# Patient Record
Sex: Female | Born: 1970 | Hispanic: No | Smoking: Never smoker
Health system: Southern US, Community
[De-identification: ages and names within clinical notes are randomized; demographics above are authoritative.]

## PROBLEM LIST (undated history)

## (undated) DIAGNOSIS — R519 Headache, unspecified: Secondary | ICD-10-CM

## (undated) DIAGNOSIS — K219 Gastro-esophageal reflux disease without esophagitis: Secondary | ICD-10-CM

## (undated) DIAGNOSIS — R51 Headache: Secondary | ICD-10-CM

## (undated) DIAGNOSIS — E079 Disorder of thyroid, unspecified: Secondary | ICD-10-CM

## (undated) HISTORY — PX: OTHER SURGICAL HISTORY: SHX169

## (undated) HISTORY — DX: Disorder of thyroid, unspecified: E07.9

## (undated) HISTORY — DX: Headache, unspecified: R51.9

## (undated) HISTORY — PX: BARIATRIC SURGERY: SHX1103

## (undated) HISTORY — PX: CHOLECYSTECTOMY: SHX55

## (undated) HISTORY — DX: Headache: R51

## (undated) HISTORY — PX: UMBILICAL HERNIA REPAIR: SHX196

---

## 1999-04-24 ENCOUNTER — Encounter: Admission: RE | Admit: 1999-04-24 | Discharge: 1999-04-24 | Payer: Self-pay | Admitting: Family Medicine

## 1999-04-24 ENCOUNTER — Encounter: Payer: Self-pay | Admitting: Family Medicine

## 1999-04-30 ENCOUNTER — Ambulatory Visit (HOSPITAL_COMMUNITY): Admission: RE | Admit: 1999-04-30 | Discharge: 1999-04-30 | Payer: Self-pay | Admitting: Gastroenterology

## 1999-04-30 ENCOUNTER — Encounter: Payer: Self-pay | Admitting: Gastroenterology

## 2001-12-20 ENCOUNTER — Other Ambulatory Visit: Admission: RE | Admit: 2001-12-20 | Discharge: 2001-12-20 | Payer: Self-pay | Admitting: Obstetrics & Gynecology

## 2003-05-15 ENCOUNTER — Other Ambulatory Visit: Admission: RE | Admit: 2003-05-15 | Discharge: 2003-05-15 | Payer: Self-pay | Admitting: Obstetrics & Gynecology

## 2003-10-29 ENCOUNTER — Encounter: Admission: RE | Admit: 2003-10-29 | Discharge: 2003-10-29 | Payer: Self-pay | Admitting: Family Medicine

## 2003-11-13 ENCOUNTER — Encounter (INDEPENDENT_AMBULATORY_CARE_PROVIDER_SITE_OTHER): Payer: Self-pay | Admitting: Specialist

## 2003-11-13 ENCOUNTER — Ambulatory Visit (HOSPITAL_COMMUNITY): Admission: RE | Admit: 2003-11-13 | Discharge: 2003-11-14 | Payer: Self-pay | Admitting: General Surgery

## 2005-08-29 ENCOUNTER — Emergency Department (HOSPITAL_COMMUNITY): Admission: EM | Admit: 2005-08-29 | Discharge: 2005-08-29 | Payer: Self-pay | Admitting: Family Medicine

## 2006-02-26 ENCOUNTER — Ambulatory Visit: Payer: Self-pay | Admitting: Hematology & Oncology

## 2006-04-05 LAB — CBC WITH DIFFERENTIAL/PLATELET
BASO%: 0.5 % (ref 0.0–2.0)
EOS%: 1.6 % (ref 0.0–7.0)
HCT: 36.1 % (ref 34.8–46.6)
LYMPH%: 26 % (ref 14.0–48.0)
MCH: 18.4 pg — ABNORMAL LOW (ref 26.0–34.0)
MCHC: 30.4 g/dL — ABNORMAL LOW (ref 32.0–36.0)
NEUT%: 68.9 % (ref 39.6–76.8)
Platelets: 315 10*3/uL (ref 145–400)

## 2006-04-06 LAB — IRON AND TIBC: UIBC: 278 ug/dL

## 2006-04-08 LAB — COMPREHENSIVE METABOLIC PANEL
ALT: 18 U/L (ref 0–35)
CO2: 29 mEq/L (ref 19–32)
Creatinine, Ser: 0.64 mg/dL (ref 0.40–1.20)
Total Bilirubin: 0.4 mg/dL (ref 0.3–1.2)

## 2006-04-08 LAB — IMMUNOFIXATION ELECTROPHORESIS
IgA: 225 mg/dL (ref 68–378)
IgG (Immunoglobin G), Serum: 2930 mg/dL — ABNORMAL HIGH (ref 694–1618)
IgM, Serum: 107 mg/dL (ref 60–263)

## 2006-04-08 LAB — LACTATE DEHYDROGENASE: LDH: 197 U/L (ref 94–250)

## 2006-04-23 ENCOUNTER — Ambulatory Visit: Payer: Self-pay | Admitting: Oncology

## 2006-04-27 LAB — COMPREHENSIVE METABOLIC PANEL
CO2: 28 mEq/L (ref 19–32)
Calcium: 9.1 mg/dL (ref 8.4–10.5)
Creatinine, Ser: 0.6 mg/dL (ref 0.40–1.20)
Sodium: 138 mEq/L (ref 135–145)
Total Bilirubin: 0.3 mg/dL (ref 0.3–1.2)
Total Protein: 7.8 g/dL (ref 6.0–8.3)

## 2006-04-27 LAB — CBC WITH DIFFERENTIAL/PLATELET
BASO%: 0.4 % (ref 0.0–2.0)
Basophils Absolute: 0.1 10*3/uL (ref 0.0–0.1)
Eosinophils Absolute: 0.3 10*3/uL (ref 0.0–0.5)
HCT: 38.2 % (ref 34.8–46.6)
HGB: 11.3 g/dL — ABNORMAL LOW (ref 11.6–15.9)
LYMPH%: 27.3 % (ref 14.0–48.0)
MCHC: 29.4 g/dL — ABNORMAL LOW (ref 32.0–36.0)
NEUT#: 9.9 10*3/uL — ABNORMAL HIGH (ref 1.5–6.5)
Platelets: 306 10*3/uL (ref 145–400)
RBC: 6.28 10*6/uL — ABNORMAL HIGH (ref 3.70–5.32)
WBC: 14.5 10*3/uL — ABNORMAL HIGH (ref 3.9–10.0)

## 2006-05-03 LAB — HEMOGLOBINOPATHY EVALUATION
Hgb A: 95.4 % — ABNORMAL LOW (ref 96.8–97.8)
Hgb S Quant: 0 % (ref 0.0–0.0)

## 2006-05-03 LAB — BCR/ABL QUALITATIVE: BCR/ABL t(9,22): NEGATIVE

## 2006-05-27 ENCOUNTER — Emergency Department (HOSPITAL_COMMUNITY): Admission: EM | Admit: 2006-05-27 | Discharge: 2006-05-27 | Payer: Self-pay | Admitting: Emergency Medicine

## 2006-05-30 ENCOUNTER — Emergency Department (HOSPITAL_COMMUNITY): Admission: EM | Admit: 2006-05-30 | Discharge: 2006-05-30 | Payer: Self-pay | Admitting: Emergency Medicine

## 2006-06-03 ENCOUNTER — Other Ambulatory Visit: Admission: RE | Admit: 2006-06-03 | Discharge: 2006-06-03 | Payer: Self-pay | Admitting: Family Medicine

## 2006-06-08 ENCOUNTER — Ambulatory Visit: Payer: Self-pay | Admitting: Oncology

## 2006-06-08 LAB — CBC WITH DIFFERENTIAL/PLATELET
BASO%: 0.6 % (ref 0.0–2.0)
LYMPH%: 26.6 % (ref 14.0–48.0)
MCHC: 30.1 g/dL — ABNORMAL LOW (ref 32.0–36.0)
MONO#: 0.5 10*3/uL (ref 0.1–0.9)
Platelets: 285 10*3/uL (ref 145–400)
RBC: 5.87 10*6/uL — ABNORMAL HIGH (ref 3.70–5.32)
RDW: 19.3 % — ABNORMAL HIGH (ref 11.3–14.5)
WBC: 9.3 10*3/uL (ref 3.9–10.0)

## 2006-06-09 LAB — ANA: Anti Nuclear Antibody(ANA): NEGATIVE

## 2006-06-09 LAB — HEPATITIS B CORE ANTIBODY, TOTAL: Hep B Core Total Ab: NEGATIVE

## 2006-06-17 ENCOUNTER — Ambulatory Visit (HOSPITAL_BASED_OUTPATIENT_CLINIC_OR_DEPARTMENT_OTHER): Admission: RE | Admit: 2006-06-17 | Discharge: 2006-06-17 | Payer: Self-pay | Admitting: Family Medicine

## 2006-06-20 ENCOUNTER — Ambulatory Visit: Payer: Self-pay | Admitting: Internal Medicine

## 2007-06-09 ENCOUNTER — Inpatient Hospital Stay (HOSPITAL_COMMUNITY): Admission: RE | Admit: 2007-06-09 | Discharge: 2007-06-15 | Payer: Self-pay | Admitting: General Surgery

## 2007-11-14 ENCOUNTER — Ambulatory Visit: Payer: Self-pay | Admitting: Pulmonary Disease

## 2007-11-14 DIAGNOSIS — G4733 Obstructive sleep apnea (adult) (pediatric): Secondary | ICD-10-CM | POA: Insufficient documentation

## 2007-11-14 DIAGNOSIS — J309 Allergic rhinitis, unspecified: Secondary | ICD-10-CM | POA: Insufficient documentation

## 2007-11-22 ENCOUNTER — Ambulatory Visit (HOSPITAL_BASED_OUTPATIENT_CLINIC_OR_DEPARTMENT_OTHER): Admission: RE | Admit: 2007-11-22 | Discharge: 2007-11-22 | Payer: Self-pay | Admitting: Pulmonary Disease

## 2007-11-22 ENCOUNTER — Ambulatory Visit: Payer: Self-pay | Admitting: Pulmonary Disease

## 2007-11-29 ENCOUNTER — Encounter: Payer: Self-pay | Admitting: Pulmonary Disease

## 2007-12-08 ENCOUNTER — Ambulatory Visit: Payer: Self-pay | Admitting: Pulmonary Disease

## 2007-12-09 ENCOUNTER — Ambulatory Visit: Payer: Self-pay | Admitting: Pulmonary Disease

## 2007-12-09 DIAGNOSIS — J45909 Unspecified asthma, uncomplicated: Secondary | ICD-10-CM | POA: Insufficient documentation

## 2007-12-28 ENCOUNTER — Telehealth (INDEPENDENT_AMBULATORY_CARE_PROVIDER_SITE_OTHER): Payer: Self-pay | Admitting: *Deleted

## 2008-02-08 ENCOUNTER — Ambulatory Visit: Payer: Self-pay | Admitting: Pulmonary Disease

## 2008-02-21 ENCOUNTER — Emergency Department (HOSPITAL_COMMUNITY): Admission: EM | Admit: 2008-02-21 | Discharge: 2008-02-21 | Payer: Self-pay | Admitting: Emergency Medicine

## 2008-02-28 ENCOUNTER — Encounter: Payer: Self-pay | Admitting: Pulmonary Disease

## 2008-02-28 ENCOUNTER — Encounter: Admission: RE | Admit: 2008-02-28 | Discharge: 2008-04-09 | Payer: Self-pay | Admitting: Pulmonary Disease

## 2008-04-10 ENCOUNTER — Ambulatory Visit: Payer: Self-pay | Admitting: Diagnostic Radiology

## 2008-04-10 ENCOUNTER — Emergency Department (HOSPITAL_BASED_OUTPATIENT_CLINIC_OR_DEPARTMENT_OTHER): Admission: EM | Admit: 2008-04-10 | Discharge: 2008-04-10 | Payer: Self-pay | Admitting: Emergency Medicine

## 2008-05-08 ENCOUNTER — Ambulatory Visit: Payer: Self-pay | Admitting: Pulmonary Disease

## 2008-09-05 ENCOUNTER — Emergency Department (HOSPITAL_COMMUNITY): Admission: EM | Admit: 2008-09-05 | Discharge: 2008-09-05 | Payer: Self-pay | Admitting: Family Medicine

## 2008-10-18 ENCOUNTER — Encounter: Payer: Self-pay | Admitting: Pulmonary Disease

## 2009-07-19 ENCOUNTER — Telehealth: Payer: Self-pay | Admitting: Pulmonary Disease

## 2009-07-19 ENCOUNTER — Telehealth (INDEPENDENT_AMBULATORY_CARE_PROVIDER_SITE_OTHER): Payer: Self-pay | Admitting: *Deleted

## 2009-07-29 ENCOUNTER — Ambulatory Visit: Payer: Self-pay | Admitting: Pulmonary Disease

## 2009-07-29 DIAGNOSIS — R0902 Hypoxemia: Secondary | ICD-10-CM | POA: Insufficient documentation

## 2009-07-30 ENCOUNTER — Telehealth: Payer: Self-pay | Admitting: Pulmonary Disease

## 2009-08-02 ENCOUNTER — Encounter: Payer: Self-pay | Admitting: Pulmonary Disease

## 2009-08-16 ENCOUNTER — Encounter: Payer: Self-pay | Admitting: Pulmonary Disease

## 2009-08-19 ENCOUNTER — Ambulatory Visit: Payer: Self-pay | Admitting: Pulmonary Disease

## 2009-08-22 ENCOUNTER — Encounter: Payer: Self-pay | Admitting: Pulmonary Disease

## 2009-08-27 ENCOUNTER — Encounter: Payer: Self-pay | Admitting: Pulmonary Disease

## 2009-11-18 ENCOUNTER — Ambulatory Visit (HOSPITAL_COMMUNITY): Admission: RE | Admit: 2009-11-18 | Discharge: 2009-11-18 | Payer: Self-pay | Admitting: Surgery

## 2009-11-24 ENCOUNTER — Encounter: Payer: Self-pay | Admitting: Pulmonary Disease

## 2009-11-24 DIAGNOSIS — E662 Morbid (severe) obesity with alveolar hypoventilation: Secondary | ICD-10-CM | POA: Insufficient documentation

## 2009-11-25 ENCOUNTER — Ambulatory Visit: Payer: Self-pay | Admitting: Pulmonary Disease

## 2009-12-01 ENCOUNTER — Emergency Department (HOSPITAL_COMMUNITY): Admission: EM | Admit: 2009-12-01 | Discharge: 2009-12-02 | Payer: Self-pay | Admitting: Emergency Medicine

## 2009-12-02 ENCOUNTER — Ambulatory Visit (HOSPITAL_COMMUNITY): Admission: RE | Admit: 2009-12-02 | Discharge: 2009-12-02 | Payer: Self-pay | Admitting: Surgery

## 2009-12-03 ENCOUNTER — Encounter: Payer: Self-pay | Admitting: Pulmonary Disease

## 2009-12-09 ENCOUNTER — Encounter: Admission: RE | Admit: 2009-12-09 | Discharge: 2010-02-06 | Payer: Self-pay | Admitting: Surgery

## 2009-12-16 ENCOUNTER — Encounter: Payer: Self-pay | Admitting: Pulmonary Disease

## 2010-01-30 ENCOUNTER — Ambulatory Visit: Payer: Self-pay | Admitting: Pulmonary Disease

## 2010-02-04 ENCOUNTER — Telehealth (INDEPENDENT_AMBULATORY_CARE_PROVIDER_SITE_OTHER): Payer: Self-pay | Admitting: *Deleted

## 2010-03-28 ENCOUNTER — Ambulatory Visit: Payer: Self-pay | Admitting: Pulmonary Disease

## 2010-05-19 ENCOUNTER — Ambulatory Visit
Admission: RE | Admit: 2010-05-19 | Discharge: 2010-05-19 | Payer: Self-pay | Source: Home / Self Care | Attending: Pulmonary Disease | Admitting: Pulmonary Disease

## 2010-05-23 ENCOUNTER — Encounter: Payer: Self-pay | Admitting: Pulmonary Disease

## 2010-05-24 ENCOUNTER — Encounter: Payer: Self-pay | Admitting: Pulmonary Disease

## 2010-06-12 NOTE — Progress Notes (Signed)
Summary: surgery clearance-need letter asap  Phone Note Call from Patient Call back at cell: [phone]   Caller: Patient Call For: [facility] Summary of Call: would like surgery clearance for gastric bypass surgery d/t sleep apnea.  scheduled for 10.25.11, letter needs to be given to nurse by 10.3.11 - Sherri.  patient will come to pick this up.   Initial call taken by: Boone Master CNA/MA,  February 04, 2010 3:30 PM  Follow-up for Phone Call        [facility]. Does pt need letter or does OV note need to be sent to nurse? Carron Curie CMA  February 04, 2010 4:16 PM  sorry, pt is requesting a letter.  also, pt is here in the office with her mother for a . Boone Master CNA/MA  February 04, 2010 4:19 PM    Additional Follow-up for Phone Call Additional follow up Details #1::        My last OV note should be adequate. Additional Follow-up by: Coralyn Helling MD,  February 06, 2010 1:35 PM    Additional Follow-up for Phone Call Additional follow up Details #2::    printed last OV note and left at front desk for pt to pick up.  pt aware.  Aundra Millet Reynolds LPN  February 06, 2010 2:39 PM

## 2010-06-12 NOTE — Procedures (Signed)
Summary: Oxygen Sat/[facility]  Oxygen Sat/[facility]   Imported By: Lester [place] 12/19/2009 08:05:14  _____________________________________________________________________  External Attachment:    Type:   Image     Comment:   External Document

## 2010-06-12 NOTE — Assessment & Plan Note (Signed)
Summary: [name] ///kp   Copy to:  Sabino Gasser, Dr. Daphine Deutscher with Bariatric surgery program [facility] Primary Provider/Referring Provider:  Shaune Pollack  CC:  [name], SOB with exertion and at rest, pt states breathing is okay, bipap uses everynight, it is currently messing up, x6 hours a  night, pt states she is getting her bipap fixed soon, pt states at night occasionally she has to turn her oxygen up to 4liters bc she states she quits breathing, and pt states she has a dry cough at night and it causes her to wake up at times.  History of Present Illness: 40 yo female with severe OSA/OHS using BPAP and 3 liters oxygen and asthma.  She has not been able to lose weight.  She was told that she would not be a candidate for bariatric surgery unless she can get her BMI to 50.    She has more trouble with her breathing with the heat.  She was doing better when on inhalers, but ran out of these.  She has some cough and occasional wheeze.  She does not have much sputum, and denies chest pain.  Her sinuses have been okay.  She tries using her BPAP.  She wakes up during the night feeling sweaty and with a cough.  She then takes her mask off.  She thinks the machine is broken.  She continues to use supplemental oxygen at 3 liters continuously.  Preventive Screening-Counseling & Management  Alcohol-Tobacco     Smoking Status: never     Passive Smoke Exposure: yes  Current Medications (verified): 1)  Advair Diskus 250-50 Mcg/dose  Misc (Fluticasone-Salmeterol) .... One Inhalation Two Times A Day 2)  Proventil Hfa 108 (90 Base) Mcg/act  Aers (Albuterol Sulfate) .... Two Puffs Four Times Daily. 3)  Levothyroxine Sodium 150 Mcg Tabs (Levothyroxine Sodium) .Marland Kitchen.. 1 By Mouth Daily 4)  Glucophage 500 Mg  Tabs (Metformin Hcl) .... Take One Tablet By Mouth Two Times A Day 5)  Nasonex 50 Mcg/act  Susp (Mometasone Furoate) .... 2 Sprays Each Nostril Once Daily 6)  Bpap .... At Bedtime  Allergies  (verified): No Known Drug Allergies  Past History:  Past Medical History: Severe OSA      - PSG 06/17/06 AHI 154, O2 nadir 54%      - autoBPAP Hypoxemia      - SpO2 81% on room air with exertion 07/29/09      - 3 liters oxygen 24/7 Morbid obesity Diabetes mellitus Hypothyroidism Asthma      - Spirometry 07/29/09 FEV1 1.29(47%), FEV1% 87 Rhinitis  Past Surgical History: Reviewed history from 11/14/2007 and no changes required. Laprascopic Cholecystectomy 11/13/03 Ventral hernia repair 06/09/07  Vital Signs:  Patient profile:   40 year old female Height:      62 inches Weight:      347.6 pounds O2 Sat:      94 % on 3 L/min Temp:     98.5 degrees F oral Pulse rate:   87 / minute BP sitting:   124 / 78  (left arm) Cuff size:   large  Vitals Entered By: Carver Fila (November 25, 2009 11:18 AM)  O2 Flow:  3 L/min CC: [name], SOB with exertion and at rest, pt states breathing is okay, bipap uses everynight, it is currently messing up, x6 hours a  night, pt states she is getting her bipap fixed soon, pt states at night occasionally she has to turn her oxygen up to 4liters bc she  states she quits breathing, pt states she has a dry cough at night and it causes her to wake up at times Comments meds and allergies updated Phone number updated Carver Fila  November 25, 2009 11:18 AM    Physical Exam  General:  on supplemental oxygen and obese.   Nose:  clear nasal discharge. Mouth:  MP IV, no erythema Neck:  no JVD.   Lungs:  Decreased breath sounds, no wheezing or rales Heart:  regular rhythm, normal rate, and no murmurs.   Extremities:  2+ nonpitting edema Cervical Nodes:  no significant adenopathy   Impression & Recommendations:  Problem # 1:  SLEEP APNEA, OBSTRUCTIVE (ICD-327.23) She thinks her machine is not working right.  I will have her DME check her set up and also get a copy of her current download.  I again emphasized the importance of maintaining her compliance with  BPAP.  Problem # 2:  HYPOXEMIA (ICD-799.02) She is to continue on 3 liters oxygen.  I explained that she will likely need to use oxygen until she can lose weight.  Problem # 3:  ASTHMA (ICD-493.90) She has difficulty affording her inhalers.  I have given her samples of advair and proair.  Her breathing is better with inhaler therapy.  Problem # 4:  ALLERGIC RHINITIS (ICD-477.9) She is to continue with nasonex as needed.  I have given her a sample of this.  Problem # 5:  MORBID OBESITY (ICD-278.01) She is followed at the bariatric surgery clinic.  She has been educated about diet modification.  I explained to her the importance of exercise.  I explained to her that there are no pulmonary contra-indications to her exercising.  I advised her to start a gradual exercise program with walking.  Medications Added to Medication List This Visit: 1)  Proair Hfa 108 (90 Base) Mcg/act Aers (Albuterol sulfate) .... Two puffs up to four times per day as needed  Complete Medication List: 1)  Advair Diskus 250-50 Mcg/dose Misc (Fluticasone-salmeterol) .... One inhalation two times a day 2)  Proair Hfa 108 (90 Base) Mcg/act Aers (Albuterol sulfate) .... Two puffs up to four times per day as needed 3)  Nasonex 50 Mcg/act Susp (Mometasone furoate) .... 2 sprays each nostril once daily 4)  Levothyroxine Sodium 150 Mcg Tabs (Levothyroxine sodium) .Marland Kitchen.. 1 by mouth daily 5)  Glucophage 500 Mg Tabs (Metformin hcl) .... Take one tablet by mouth two times a day  Other Orders: Est. Patient Level III (04540) DME Referral (DME)  Patient Instructions: 1)  Will check BPAP machine pressure 2)  Follow up in 4 to 6 months Prescriptions: NASONEX 50 MCG/ACT  SUSP (MOMETASONE FUROATE) 2 sprays each nostril once daily  #1 x 6   Entered and Authorized by:   Coralyn Helling MD   Signed by:   Coralyn Helling MD on 11/25/2009   Method used:   Electronically to        [facility]  [phone]* (retail)       [address]       [place], [place]  [address]       Ph: [phone] or [phone]       Fax: [phone]   RxID:   [id number] ADVAIR DISKUS 250-50 MCG/DOSE  MISC (FLUTICASONE-SALMETEROL) One inhalation two times a day  #1 x 6   Entered and Authorized by:   Coralyn Helling MD   Signed by:   Coralyn Helling MD  on 11/25/2009   Method used:   Electronically to        [facility]  [phone]* (retail)       [address]       [place], [place]  [address]       Ph: [phone] or [phone]       Fax: [phone]   RxID:   [id number] PROAIR HFA 108 (90 BASE) MCG/ACT AERS (ALBUTEROL SULFATE) two puffs up to four times per day as needed  #1 x 6   Entered and Authorized by:   Coralyn Helling MD   Signed by:   Coralyn Helling MD on 11/25/2009   Method used:   Electronically to        [facility]  [phone]* (retail)       [address]       [place], [place]  [address]       Ph: [phone] or [phone]       Fax: [phone]   RxID:   [id number]    Immunization History:  Influenza Immunization History:    Influenza:  historical (01/17/2009)  Pneumovax Immunization History:    Pneumovax:  historical (10/10/2006)

## 2010-06-12 NOTE — Assessment & Plan Note (Signed)
Summary: rov ///kp   Copy to:  Sabino Gasser, [facility] in [facility] Primary Provider/Referring Provider:  Shaune Pollack  CC:  Patient here for follow-up...gastric bypass surgery done 03/04/2010...she says her breathing has improved...not wearing bipap on a regular basis...no oxygen use.  History of Present Illness: 40 yo female with severe OSA/OHS using BPAP and 3 liters oxygen and asthma.  She has lost an additional 40lbs since her last visit.  She is not using her oxygen.  She is intermittently using BPAP, and is not sure if this is helping anymore.  Her breathing has improved with weight loss.  She is using advair once daily, and not using proair.  She continues to use nasonex, and this helps.    Current Medications (verified): 1)  Advair Diskus 250-50 Mcg/dose  Misc (Fluticasone-Salmeterol) .... One Inhalation Two Times A Day 2)  Proair Hfa 108 (90 Base) Mcg/act Aers (Albuterol Sulfate) .... Two Puffs Up To Four Times Per Day As Needed 3)  Nasonex 50 Mcg/act  Susp (Mometasone Furoate) .... 2 Sprays Each Nostril Once Daily 4)  Levothyroxine Sodium 150 Mcg Tabs (Levothyroxine Sodium) .Marland Kitchen.. 1 By Mouth Daily 5)  Spacer Device .... Use As Needed With Proair  Allergies (verified): No Known Drug Allergies  Past History:  Past Medical History: Severe OSA      - PSG 06/17/06 AHI 154, O2 nadir 54%      - BPAP 16/12 Hypoxemia      - SpO2 81% on room air with exertion 07/29/09      - Improved with weight loss Morbid obesity      - s/p bariatric surgery Oct 2011 Diabetes mellitus Hypothyroidism Asthma      - Spirometry 07/29/09 FEV1 1.29(47%), FEV1% 87 Rhinitis  Vital Signs:  Patient profile:   40 year old female Height:      62 inches (157.48 cm) Weight:      264.13 pounds (120.06 kg) BMI:     48.48 O2 Sat:      97 % on Room air Temp:     98.5 degrees F (36.94 degrees C) oral Pulse rate:   64 / minute BP sitting:   102 / 70  (left arm) Cuff size:    large  Vitals Entered By: Michel Bickers CMA (May 19, 2010 10:36 AM)  O2 Sat at Rest %:  97 O2 Flow:  Room air CC: Patient here for follow-up...gastric bypass surgery done 03/04/2010...she says her breathing has improved...not wearing bipap on a regular basis...no oxygen use Comments Medications reviewed with patient Michel Bickers CMA  May 19, 2010 10:42 AM   Physical Exam  General:  normal appearance and healthy appearing.   Nose:  no deformity, discharge, inflammation, or lesions Mouth:  MP 3, no erythema Neck:  no JVD.   Lungs:  clear bilaterally to auscultation and percussion Heart:  regular rhythm, normal rate, and no murmurs.   Extremities:  no clubbing, cyanosis, edema, or deformity noted Neurologic:  normal CN II-XII and strength normal.   Cervical Nodes:  no significant adenopathy Psych:  alert and cooperative; normal mood and affect; normal attention span and concentration   Impression & Recommendations:  Problem # 1:  ASTHMA (ICD-493.90) Improved.  Will change to advair 100/50.  If she is stable at next visit, will continue to step down her inhaler regimen.  Problem # 2:  ALLERGIC RHINITIS (ICD-477.9) She is to continue nasonex.  Problem # 3:  SLEEP APNEA, OBSTRUCTIVE (  ICD-327.23) Depending on results of ONO will decide if she needs to have repeat sleep test to determine if her sleep apnea has resolved with weight loss.  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  Problem # 5:  HYPOXEMIA (ICD-799.02) Will arrange for ONO on room air to see if she can have home oxygen removed.  Problem # 6:  MORBID OBESITY (ICD-278.01) She has lost almost 70 lbs since her gastric bypass surgery!  Medications Added to Medication List This Visit: 1)  Advair Diskus 100-50 Mcg/dose Aepb (Fluticasone-salmeterol) .... One puff two times a day  Complete Medication List: 1)  Advair Diskus 100-50 Mcg/dose Aepb (Fluticasone-salmeterol)  .... One puff two times a day 2)  Proair Hfa 108 (90 Base) Mcg/act Aers (Albuterol sulfate) .... Two puffs up to four times per day as needed 3)  Nasonex 50 Mcg/act Susp (Mometasone furoate) .... 2 sprays each nostril once daily 4)  Levothyroxine Sodium 150 Mcg Tabs (Levothyroxine sodium) .Marland Kitchen.. 1 by mouth daily 5)  Spacer Device  .... Use as needed with proair  Other Orders: Est. Patient Level III (91478) DME Referral (DME)  Patient Instructions: 1)  Change advair to 100/50 one puff two times a day 2)  Proair two puffs as needed 3)  Nasonex two sprays once daily 4)  Will schedule oxygen test at home 5)  Follow up in 4 months Prescriptions: NASONEX 50 MCG/ACT  SUSP (MOMETASONE FUROATE) 2 sprays each nostril once daily  #1 x 6   Entered and Authorized by:   Coralyn Helling MD   Signed by:   Coralyn Helling MD on 05/19/2010   Method used:   Electronically to        [facility]  [phone]* (retail)       [address]       [place], [place]  [address]       Ph: [phone] or [phone]       Fax: [phone]   RxID:   [id number] PROAIR HFA 108 (90 BASE) MCG/ACT AERS (ALBUTEROL SULFATE) two puffs up to four times per day as needed  #1 x 6   Entered and Authorized by:   Coralyn Helling MD   Signed by:   Coralyn Helling MD on 05/19/2010   Method used:   Electronically to        [facility]  [phone]* (retail)       [address]       [place], [place]  [address]       Ph: [phone] or [phone]       Fax: [phone]   RxID:   [id number] ADVAIR DISKUS 100-50 MCG/DOSE AEPB (FLUTICASONE-SALMETEROL) one puff two times a day  #1 x 6   Entered and Authorized by:   Coralyn Helling MD   Signed by:   Coralyn Helling MD on 05/19/2010   Method used:   Electronically to        [facility]  [phone]* (retail)       [address]       [place], [place]  [address]       Ph: [phone] or  [phone]       Fax: [phone]   RxID:   [id number]    Immunization History:  Influenza Immunization History:    Influenza:  historical (01/09/2010)   Appended Document: rov ///kp Ambulatory Pulse Oximetry  Resting; HR__62___    02 Sat__99%RA___  Lap1 (185 feet)   HR__91___   02 Sat_94%RA____ Lap2 (185 feet)   HR__92___   02 Sat_94%RA____    Lap3 (185 feet)   HR__89___   02 Sat_94%RA____  _X__Test Completed without Difficulty ___Test Stopped due to:

## 2010-06-12 NOTE — Progress Notes (Signed)
Summary: Records request from patient  Request for records received from patient. Copied 27 pages for patient pickup on Tuesday July 23, 2009. [name]  July 19, 2009 12:35 PM

## 2010-06-12 NOTE — Miscellaneous (Signed)
Summary: ONO on room air  Clinical Lists Changes Test time 6hrs 9 min.  Mean SpO2 89%, low 82%.  Spent 52 min (14%) with SpO2 < 88%.  Attempted to call pt to discuss results.   Will have my nurse call to inform patient that her oxygen is still low while she is asleep.  She does not need to use BPAP, but should continue to use oxygen at 2 liters while asleep.  Will re-assess as she continues to lose weight after bariatric surgery. Orders: Added new Referral order of DME Referral (DME) - Signed  Appended Document: ONO on room air atc pt but no answer and was unable to leave VM. Will try back later  Appended Document: ONO on room air lm on cell phone to call back  Appended Document: ONO on room air called pt home phone number and was told pt was admitted to [facility] for vomiting, sob, cough. Pt family member was not able to tell me when she would be d/c'd.

## 2010-06-12 NOTE — Miscellaneous (Signed)
Summary: BPAP download  Clinical Lists Changes Used on 12 of 15 nights with average 4hrs 25 min.  Average AHI 7 with optimal pressure of BPAP 16/12 cm H2O.  Minimal airleak.  Will have my nurse call to inform pt that BPAP download looked good, but she should try to use BPAP more consistently.  Appended Document: BPAP download Called and informed pt of the results and pt stated she verbally understood and will try to use bpap more consistently

## 2010-06-12 NOTE — Miscellaneous (Signed)
Summary: Overnight oximetry on 3L oxygen  Clinical Lists Changes Study was requested to be done with BPAP and 3 liters O2.  Study was done without BPAP.  Sample time 8hrs .  Mean SpO2 88%, low 44%.    Will discuss further with patient at [facility] on August 19, 2009.

## 2010-06-12 NOTE — Assessment & Plan Note (Signed)
Summary: 3-4 week f/u/LC   Copy to:  Sabino Gasser, Bariatric surgery program [facility] Primary Provider/Referring Provider:  Shaune Pollack  CC:  OV 3/4 week F/U, Pt states she feels better on 4LPM, and but is still on 3LPM.  History of Present Illness: I saw [name] in follow up for her severe sleep apnea, and asthma.  Her breathing and sinuses have been doing better since she was restarted on nasonex and adviar.  She had her recent overnight oximetry.  Apparently there was some confusion with the DME company about the order.  The test was done while using oxygen, but no BPAP.  This was done inspite of my orders, and inspite of the patient informing the DME company that the test was to be done with BPAP and oxygen.    Allergies (verified): No Known Drug Allergies  Past History:  Past Medical History: Reviewed history from 07/29/2009 and no changes required. Severe OSA      - PSG 06/17/06 AHI 154, O2 nadir 54% Hypoxemia      - SpO2 81% on room air with exertion 07/29/09      - 3 liters oxygen with exertion and sleep Morbid obesity Diabetes mellitus Hypothyroidism Asthma Rhinitis  Past Surgical History: Reviewed history from 11/14/2007 and no changes required. Laprascopic Cholecystectomy 11/13/03 Ventral hernia repair 06/09/07  Vital Signs:  Patient profile:   40 year old female Height:      62 inches Weight:      343.8 pounds O2 Sat:      97 % on 3 L/min Temp:     98.1 degrees F oral Pulse rate:   85 / minute BP sitting:   136 / 88  (right arm) Cuff size:   large  Vitals Entered By: Denna Haggard, CMA (August 19, 2009 3:18 PM)  O2 Sat at Rest %:  97% 3LPM O2 Flow:  3 L/min CC: OV 3/4 week F/U, Pt states she feels better on 4LPM, but is still on 3LPM   Physical Exam  General:  obese.   Nose:  clear nasal discharge. Mouth:  MP IV, no erythema Neck:  no JVD.   Lungs:  Decreased breath sounds, no wheezing or rales Heart:  regular rhythm,  normal rate, and no murmurs.   Extremities:  2+ nonpitting edema Cervical Nodes:  no significant adenopathy   Impression & Recommendations:  Problem # 1:  SLEEP APNEA, OBSTRUCTIVE (ICD-327.23) Will have her DME company send a current download from her BPAP.  Last download sent from DME company only had information through 2010.  Problem # 2:  HYPOXEMIA (ICD-799.02) She is to continue on supplemental oxygen.  Will have the DME company do a repeat ONO, but this time ensure that the procedure is done with BPAP and oxygen.  Problem # 3:  ASTHMA (ICD-493.90) She has improved with advair.  I have given her additional samples.  Problem # 4:  ALLERGIC RHINITIS (ICD-477.9) Continue nasal irrigation and nasonex as needed.  Problem # 5:  MORBID OBESITY (ICD-278.01) She is to follow up with the Bariatic Surgery program.  Complete Medication List: 1)  Advair Diskus 250-50 Mcg/dose Misc (Fluticasone-salmeterol) .... One inhalation two times a day 2)  Proventil Hfa 108 (90 Base) Mcg/act Aers (Albuterol sulfate) .... Two puffs four times daily. 3)  Levothyroxine Sodium 150 Mcg Tabs (Levothyroxine sodium) .Marland Kitchen.. 1 by mouth daily 4)  Glucophage 500 Mg Tabs (Metformin hcl) .... Take one tablet by mouth two times a day  5)  Nasonex 50 Mcg/act Susp (Mometasone furoate) .... 2 sprays each nostril once daily 6)  Bpap  .... At bedtime  Other Orders: Est. Patient Level I (16109) DME Referral (DME) DME Referral (DME)  Patient Instructions: 1)  Follow up in 4 months

## 2010-06-12 NOTE — Miscellaneous (Signed)
Summary: ONO with BPAP and 3 liters oxygen from 08/21/09  Clinical Lists Changes Test time 7hrs 30 min.  Mean SpO2 91%, low 81%.  Spent 24 sec (2.1% of test) with SpO2 < 88%.  Will have my nurse call to inform pt that oxygen report looked good, and to continue with current set up for BPAP and 3 liters oxygen.  Appended Document: ONO with BPAP and 3 liters oxygen from 08/21/09 The patient is aware of ONO results and to stay on current set up with BIPAP and 3 liters oxygen.

## 2010-06-12 NOTE — Miscellaneous (Signed)
Summary: auto CPAP report  Clinical Lists Changes Used on 10 of 116 days.  Average use 3hrs 45 min per night.  Optimal pressure 15 cm H2O with average AHI 2.9.  Patient has been set up with CPAP even though my order was for BPAP.  She reported having more difficulty with her current machine (which would be CPAP) compared to previous machines used (BPAP).  Will have [facility] check to see why pt has CPAP and not BPAP as ordered. 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

## 2010-06-12 NOTE — Assessment & Plan Note (Signed)
Summary: rov/per pt request/apc   Visit Type:  Follow-up Copy to:  Bindu Balan, [facility] for Bariatric Surgery in [facility]  CC:  OSA.Marland KitchenMarland KitchenAsthma.Marland KitchenMarland KitchenThe pt says she is doing well on bpap...she c/o some increased sob with exertion...she is requesting a spacer to use with her Proair.  History of Present Illness: 40 yo female with severe OSA/OHS using BPAP and 3 liters oxygen and asthma.  She had BPAP download from December 16, 2009:  Used on 12 of 15 nights with average 4hrs 25 min.  Average AHI 7 with optimal pressure of BPAP 16/12 cm H2O.  Minimal airleak.  She has been doing better with BPAP.  She is being evaluated for bariatric surgery at the [facility] for Bariatric Surgery.  Her breathing otherwise has been stable.  She does not have much cough.  She has not been getting wheeze, or sputum.  She denies chest pain or palpitations.        Current Medications (verified): 1)  Advair Diskus 250-50 Mcg/dose  Misc (Fluticasone-Salmeterol) .... One Inhalation Two Times A Day 2)  Proair Hfa 108 (90 Base) Mcg/act Aers (Albuterol Sulfate) .... Two Puffs Up To Four Times Per Day As Needed 3)  Nasonex 50 Mcg/act  Susp (Mometasone Furoate) .... 2 Sprays Each Nostril Once Daily 4)  Levothyroxine Sodium 150 Mcg Tabs (Levothyroxine Sodium) .Marland Kitchen.. 1 By Mouth Daily 5)  Glucophage 500 Mg  Tabs (Metformin Hcl) .... Take One Tablet By Mouth Two Times A Day 6)  Ferrous Sulfate 325 (65 Fe) Mg Tabs (Ferrous Sulfate) .Marland Kitchen.. 1 By Mouth Daily  Allergies (verified): No Known Drug Allergies  Past History:  Past Medical History: Severe OSA      - PSG 06/17/06 AHI 154, O2 nadir 54%      - BPAP 16/12 Hypoxemia      - SpO2 81% on room air with exertion 07/29/09      - 3 liters oxygen 24/7 Morbid obesity Diabetes mellitus Hypothyroidism Asthma      - Spirometry 07/29/09 FEV1 1.29(47%), FEV1% 87 Rhinitis  Past Surgical History: Reviewed history from 11/14/2007 and no changes required. Laprascopic  Cholecystectomy 11/13/03 Ventral hernia repair 06/09/07  Family History: Reviewed history from 11/14/2007 and no changes required. Heart Disease - Father and mother  Social History: Reviewed history from 11/14/2007 and no changes required. Married.  Disabled.  No history of tobacco or alcohol abuse.  Vital Signs:  Patient profile:   40 year old female Height:      62 inches (157.48 cm) Weight:      346 pounds (157.27 kg) BMI:     63.51 O2 Sat:      98 % on 3 L/min Temp:     98.4 degrees F (36.89 degrees C) oral Pulse rate:   83 / minute BP sitting:   118 / 90  (right arm) Cuff size:   large  Vitals Entered By: Michel Bickers CMA (January 30, 2010 3:34 PM)  O2 Sat at Rest %:  98 O2 Flow:  3 L/min  Physical Exam  General:  on supplemental oxygen and obese.   Eyes:  PERRLA and EOMI.   Nose:  clear nasal discharge. Mouth:  MP IV, no erythema Neck:  no JVD.   Lungs:  Decreased breath sounds, no wheezing or rales Heart:  regular rhythm, normal rate, and no murmurs.   Abdomen:  obese, soft, nontender Extremities:  2+ nonpitting edema Neurologic:  normal CN II-XII and strength normal.   Cervical Nodes:  no significant adenopathy Psych:  alert and cooperative; normal mood and affect; normal attention span and concentration   Impression & Recommendations:  Problem # 1:  ASTHMA (ICD-493.90) Stable.  She is to continue advair and as needed proair.  I have given her a spacer device.  Problem # 2:  ALLERGIC RHINITIS (ICD-477.9)  Stable.  She is to continue using nasonex and nasal irrigation.  Problem # 3:  SLEEP APNEA, OBSTRUCTIVE (ICD-327.23)  Her recent BPAP download showed good control of her apnea.  Problem # 4:  OBESITY HYPOVENTILATION SYNDROME (ICD-278.03)  She is to continue on supplemental oxygen 24/7.    Problem # 5:  PRE-OPERATIVE RESPIRATORY EXAMINATION (ICD-V72.82)  She is being evaluated at the [facility] for Bariatric Surgery in [facility].  Clearly  the majority of her current health problems are related to her weight, and proceeding with surgical intervention is likely her only option at this point.  She is at increased peri-operative risk for pulmonary complications, but these risks are not prohibitive.  I believe that the benefits of surgery would outway the risk.  She has mild, persistent asthma that is well controlled on her current inhaler regimen of advair and as needed proair.  She should continue on her inhaler regimen during the peri-operative period.  She also has severe obstructive sleep apnea with obesity hypoventilation syndrome.  She is controlled on her regimen of BPAP 16/12 cm H2O with 3 liters oxygen.  She should have close monitoring of her oxygenation during the peri-operative period and continue with BPAP therapy during the peri-operative period.  Medications Added to Medication List This Visit: 1)  Ferrous Sulfate 325 (65 Fe) Mg Tabs (Ferrous sulfate) .Marland Kitchen.. 1 by mouth daily 2)  Spacer Device  .... Use as needed with proair  Complete Medication List: 1)  Advair Diskus 250-50 Mcg/dose Misc (Fluticasone-salmeterol) .... One inhalation two times a day 2)  Proair Hfa 108 (90 Base) Mcg/act Aers (Albuterol sulfate) .... Two puffs up to four times per day as needed 3)  Nasonex 50 Mcg/act Susp (Mometasone furoate) .... 2 sprays each nostril once daily 4)  Levothyroxine Sodium 150 Mcg Tabs (Levothyroxine sodium) .Marland Kitchen.. 1 by mouth daily 5)  Glucophage 500 Mg Tabs (Metformin hcl) .... Take one tablet by mouth two times a day 6)  Ferrous Sulfate 325 (65 Fe) Mg Tabs (Ferrous sulfate) .Marland Kitchen.. 1 by mouth daily 7)  Spacer Device  .... Use as needed with proair  Other Orders: Est. Patient Level IV (47829)  Patient Instructions: 1)  Will get spacer device 2)  Follow up in 4 months Prescriptions: SPACER DEVICE use as needed with proair  #1 x 0   Entered by:   Michel Bickers CMA   Authorized by:   Coralyn Helling MD   Signed by:   Michel Bickers  CMA on 01/30/2010   Method used:   Print then Give to Patient   RxID:   [id number]   Appended Document: rov/per pt request/apc Office note faxed to the [facility] for Bariatric Surgery per Dr. Evlyn Courier request. Fax # is [phone]. Phone # is [phone].

## 2010-06-12 NOTE — Assessment & Plan Note (Signed)
Summary: sleep follow-up//last seen 2009//jrc   Copy to:  [name], Bariatric surgery program [facility] Primary Provider/Referring Provider:  Shaune [name]  CC:  OSA follow-up. Last seen by [name] on 05/08/2008. The patient says she is doing well on CPAP.Marland Kitchen  History of Present Illness: I saw [name] in follow up for her severe sleep apnea, and asthma.  I had not seen [name] since 2009.  She was unable to follow up do to lack of insurance.  She is being evaluated for gastric bypass surgery due to her morbid obesity, and further pulmonary evaluation was requested.  She still has her BPAP machine.  She has not had contact with her [name] for quite some time.  She has a full face mask, and reports using her BPAP on a regular basis.  She reports being able to sleep okay with her BPAP.  She continues to have problems with dyspnea with minimal exertion.  She has a dry cough, and gets occasional wheeze.  She is not having fever, and her sinuses are okay.  She has been using her proventil about once per day.  She feels that advair does help.  She has difficulty affording her medications.  She is not currently using home oxygen.  She denies chest pain, but continues to have leg swelling.  Current Medications (verified): 1)  Advair Diskus 250-50 Mcg/dose  Misc (Fluticasone-Salmeterol) .... One Inhalation Two Times A Day 2)  Proventil Hfa 108 (90 Base) Mcg/act  Aers (Albuterol Sulfate) .... Two Puffs Four Times Daily. 3)  Levothyroxine Sodium 150 Mcg Tabs (Levothyroxine Sodium) .Marland Kitchen.. 1 By Mouth Daily 4)  Glucophage 500 Mg  Tabs (Metformin Hcl) .... Take One Tablet By Mouth Two Times A Day 5)  Nasonex 50 Mcg/act  Susp (Mometasone Furoate) .... 2 Sprays Each Nostril Once Daily 6)  Cpap .... At Bedtime  Allergies (verified): No Known Drug Allergies  Past History:  Past Medical History: Severe OSA      - PSG 06/17/06 AHI 154, O2 nadir 54% Hypoxemia      - SpO2 81% on room air with  exertion 07/29/09      - 3 liters oxygen with exertion and sleep Morbid obesity Diabetes mellitus Hypothyroidism Asthma Rhinitis  Past Surgical History: Reviewed history from 11/14/2007 and no changes required. Laprascopic Cholecystectomy 11/13/03 Ventral hernia repair 06/09/07  Family History: Reviewed history from 11/14/2007 and no changes required. Heart Disease - Father and mother  Social History: Reviewed history from 11/14/2007 and no changes required. Married.  Disabled.  No history of tobacco or alcohol abuse.  Vital Signs:  Patient profile:   40 year old female Height:      62 inches (157.48 cm) Weight:      346 pounds (157.27 kg) BMI:     63.51 O2 Sat:      90 % on Room air Temp:     98.9 degrees F (37.17 degrees C) oral Pulse rate:   90 / minute BP sitting:   104 / 70  ([name] arm) Cuff size:   large  Vitals Entered By: [name] (July 29, 2009 4:11 PM)  O2 Sat at Rest %:  90 O2 Flow:  Room air  Serial Vital Signs/Assessments:  Comments: 4:55 PM Ambulatory Pulse Oximetry  Resting; HR__86___    02 Sat___90__  Lap1 (185 feet)   HR___114_   02 Sat___81__ Lap2 (185 feet)   HR_____   02 Sat_____    Lap3 (185 feet)  HR_____   02 Sat_____  ___Test Completed without Difficulty _x_Test Stopped due to: patients sats dropped to 81% on room air after walking 1 lap in the office. The patient was placed on 2 liters of oxygen and her sats recovered to 98%.  By: [name]    Physical Exam  General:  dyspneic and obese.   Nose:  clear nasal discharge. Mouth:  MP IV, mild erythema posterior pharynx Neck:  no JVD.   Lungs:  Decreased breath sounds, no wheezing or rales Heart:  regular rhythm, normal rate, and no murmurs.   Abdomen:  obese, soft, nontender Extremities:  2+ nonpitting edema Cervical Nodes:  no significant adenopathy Psych:  depressed affect.     Pulmonary Function Test Date: 07/29/2009 4:58 PM Gender:  Female  Pre-Spirometry FVC    Value: 1.48 L/min   % Pred: 45.50 % FEV1    Value: 1.29 L     Pred: 2.70 L     % Pred: 47.80 % FEV1/FVC  Value: 87.17 %     % Pred: 104.30 %  Impression & Recommendations:  Problem # 1:  SLEEP APNEA, OBSTRUCTIVE (ICD-327.23) She has severe sleep apnea.  She has been on BPAP, but has not had any new equipment for years due to lack of insurance.  I will re-establish her with a [name], and see if we can arrange for finacial assistance.  Will get a download from her BPAP, and determine if any adjustments to her pressure setting are needed.  Problem # 2:  HYPOXEMIA (ICD-799.02) She has oxygen desaturation with exertion.  I will start her on 3 liters oxygen with exertion and sleep.  Will arrange for overnight oximetry on BPAP and 3 liters oxygen.  Will see if we can arrange for finacial assistance to help provide her home oxygen set up.  Problem # 3:  ASTHMA (ICD-493.90) She is to continue on advair, and as needed proventil.  I gave her samples of these.  Problem # 4:  ALLERGIC RHINITIS (ICD-477.9) She is to continue on as needed nasonex.  I gave her samples of this.  Problem # 5:  MORBID OBESITY (ICD-278.01) I think her weight is the main culprit for her respiratory problems.  She is being evaluated for bariatric surgery.  At this point I don't think she has any other options.  She is clearly headed down a deadly pathway if she is not able to get her weight under control.  Will try to optimize her pulmonary status as best as possible prior to having her proceed with bariatric surgery.  Medications Added to Medication List This Visit: 1)  Levothyroxine Sodium 150 Mcg Tabs (Levothyroxine sodium) .Marland Kitchen.. 1 by mouth daily 2)  Bpap  .... At bedtime  Complete Medication List: 1)  Advair Diskus 250-50 Mcg/dose Misc (Fluticasone-salmeterol) .... One inhalation two times a day 2)  Proventil Hfa 108 (90 Base) Mcg/act Aers (Albuterol sulfate) .... Two puffs four times daily. 3)   Levothyroxine Sodium 150 Mcg Tabs (Levothyroxine sodium) .Marland Kitchen.. 1 by mouth daily 4)  Glucophage 500 Mg Tabs (Metformin hcl) .... Take one tablet by mouth two times a day 5)  Nasonex 50 Mcg/act Susp (Mometasone furoate) .... 2 sprays each nostril once daily 6)  Bpap  .... At bedtime  Other Orders: Est. Patient Level III (30865) Spirometry w/Graph (94010) [name] Referral ([name]) [name] Referral ([name])  Patient Instructions: 1)  Will get copy of BPAP download 2)  Use 3 liters oxygen with exertion and sleep  3)  Will set up oxygen test at night with BPAP 4)  Continue advair one puff two times a day  5)  Continue proventil two puffs up to four times per day as needed for cough, wheeze, congestion, or shortness of breath 6)  Nasonex two sprays once daily as needed for sinus congestion 7)  Follow up in 3 to 4 weeks Prescriptions: NASONEX 50 MCG/ACT  SUSP (MOMETASONE FUROATE) 2 sprays each nostril once daily  #1 x 3   Entered and Authorized by:   Coralyn Helling MD   Signed by:   Coralyn Helling MD on 07/29/2009   Method used:   Electronically to        [facility]  [phone]* (retail)       [address]       [place], [place]  [address]       Ph: [phone] or [phone]       Fax: [phone]   RxID:   [id number] PROVENTIL HFA 108 (90 BASE) MCG/ACT  AERS (ALBUTEROL SULFATE) Two puffs four times daily.  #1 x 3   Entered and Authorized by:   Coralyn Helling MD   Signed by:   Coralyn Helling MD on 07/29/2009   Method used:   Electronically to        [facility]  [phone]* (retail)       [address]       [place], [place]  [address]       Ph: [phone] or [phone]       Fax: [phone]   RxID:   [id number] ADVAIR DISKUS 250-50 MCG/DOSE  MISC (FLUTICASONE-SALMETEROL) One inhalation two times a day  #1 x 3   Entered and Authorized by:   Coralyn Helling MD   Signed by:   Coralyn Helling MD on 07/29/2009   Method used:    Electronically to        [facility]  [phone]* (retail)       [address]       [place], [place]  [address]       Ph: [phone] or [phone]       Fax: [phone]   RxID:   [id number]      [employer]ioPerfect Spirometry  ID: [id number] Patient: [name] DOB: [birth year] Age: 40 Years Old Sex: Female Race: Native American Height: 62 Weight: 346 Status: Confirmed Past Medical History:  Severe OSA, PSG 06/17/06 AHI 154, O2 nadir 54% Morbid obesity borderline DM Hypothyroidism Asthma Rhinitis    Recorded: 07/29/2009 4:58 PM  Parameter  Measured Predicted %Predicted FVC     1.48        3.26        45.50 FEV1     1.29        2.70        47.80 FEV1%   87.17        83.59        104.30 PEF    3.84        6.36        60.30   Interpretation: Pre: FVC= 1.48L FEV1= 1.29L FEV1%= 87.2% 1.29/1.48 FEV1/FVC (07/29/2009 5:02:50 PM), Severe restriction

## 2010-06-12 NOTE — Assessment & Plan Note (Signed)
Summary: per pt call/cb   Copy to:  Sabino Gasser, [facility] for Bariatric Surgery in [facility] Primary Provider/Referring Provider:  Shaune Pollack  CC:  Post gastric bypass surgery. Pt states breathing is better. Denies coughing, wheezing, and and chest tightness .  History of Present Illness: 40 yo female with severe OSA/OHS using BPAP and 3 liters oxygen and asthma.  She had her gastric bypass surgery on October 25.  She has lost about 45 lbs since I saw her last in September.  Her breathing and sleep have improved.  She is not having much cough, wheeze, sputum, or chest pain.  She uses her oxygen with exertion and sleep.  She uses her BPAP on most nights.      Current Medications (verified): 1)  Advair Diskus 250-50 Mcg/dose  Misc (Fluticasone-Salmeterol) .... One Inhalation Two Times A Day 2)  Proair Hfa 108 (90 Base) Mcg/act Aers (Albuterol Sulfate) .... Two Puffs Up To Four Times Per Day As Needed 3)  Nasonex 50 Mcg/act  Susp (Mometasone Furoate) .... 2 Sprays Each Nostril Once Daily 4)  Levothyroxine Sodium 150 Mcg Tabs (Levothyroxine Sodium) .Marland Kitchen.. 1 By Mouth Daily 5)  Spacer Device .... Use As Needed With Proair  Allergies (verified): No Known Drug Allergies  Past History:  Past Medical History: Severe OSA      - PSG 06/17/06 AHI 154, O2 nadir 54%      - BPAP 16/12 Hypoxemia      - SpO2 81% on room air with exertion 07/29/09      - 2 liters oxygen with exertion and sleep Morbid obesity      - s/p bariatric surgery Oct 2011 Diabetes mellitus Hypothyroidism Asthma      - Spirometry 07/29/09 FEV1 1.29(47%), FEV1% 87 Rhinitis  Past Surgical History: Laprascopic Cholecystectomy 11/13/03 Ventral hernia repair 06/09/07 Gastric Bypass 03/04/2010  Vital Signs:  Patient profile:   40 year old female Height:      62 inches Weight:      301.6 pounds BMI:     55.36 O2 Sat:      99 % on 3 L/min pulsed Temp:     98.3 degrees F oral Pulse rate:   59 / minute BP  sitting:   90 / 60  (left arm) Cuff size:   large  Vitals Entered By: Zackery Barefoot CMA (March 28, 2010 2:44 PM)  O2 Flow:  3 L/min pulsed CC: Post gastric bypass surgery. Pt states breathing is better. Denies coughing, wheezing, and chest tightness  Comments Medications reviewed with patient Verified contact number and pharmacy with patient Zackery Barefoot CMA  March 28, 2010 2:44 PM    Physical Exam  General:  on supplemental oxygen and obese.   Nose:  clear nasal discharge. Mouth:  MP IV, no erythema Neck:  no JVD.   Lungs:  Decreased breath sounds, no wheezing or rales Heart:  regular rhythm, normal rate, and no murmurs.   Extremities:  1+ nonpitting edema Neurologic:  normal CN II-XII and strength normal.   Cervical Nodes:  no significant adenopathy Psych:  alert and cooperative; normal mood and affect; normal attention span and concentration   Impression & Recommendations:  Problem # 1:  SLEEP APNEA, OBSTRUCTIVE (ICD-327.23)  She is to continue on BPAP for now.  Will re-assess her needs after further weight loss.  Problem # 2:  OBESITY HYPOVENTILATION SYNDROME (ICD-278.03)  She is to use 2 liters oxygen with BPAP.  Problem # 3:  HYPOXEMIA (ICD-799.02)  Her oxygen needs have improved.  Will have her use 2 liters oxygen with exertion and sleep.  Problem # 4:  ASTHMA (ICD-493.90)  Stable.  She is to continue advair and as needed proair.  Problem # 5:  ALLERGIC RHINITIS (ICD-477.9)  Stable.  She is to continue using nasonex and nasal irrigation.  Problem # 6:  MORBID OBESITY (ICD-278.01)  She has significant weight loss since bariatric surgery.  Complete Medication List: 1)  Advair Diskus 250-50 Mcg/dose Misc (Fluticasone-salmeterol) .... One inhalation two times a day 2)  Proair Hfa 108 (90 Base) Mcg/act Aers (Albuterol sulfate) .... Two puffs up to four times per day as needed 3)  Nasonex 50 Mcg/act Susp (Mometasone furoate) .... 2 sprays each  nostril once daily 4)  Levothyroxine Sodium 150 Mcg Tabs (Levothyroxine sodium) .Marland Kitchen.. 1 by mouth daily 5)  Spacer Device  .... Use as needed with proair  Other Orders: Est. Patient Level III (16109) DME Referral (DME)  Patient Instructions: 1)  Use 2 liters oxygen with sleep and exertion 2)  Follow up in 3 months .Ambulatory Pulse Oximetry  Resting; HR__66___    02 Sat_97____ra  Lap1 (185 feet)   HR_98____   02 Sat__95___ra Lap2 (185 feet)   HR_97____   02 Sat_89____ ra   Lap3 (185 feet)   HR_100____   02 Sat92_____ra  __x_Test Completed without Difficulty  .Kandice Hams CMA  March 28, 2010 3:32 PM  ___Test Stopped due to:

## 2010-06-12 NOTE — Progress Notes (Signed)
Summary: paperwork to be filled out  Phone Note Call from Patient   Caller: Patient Call For: Kavin Weckwerth Summary of Call: paperwork given to megan to be filled out by dr Jamesa Tedrick. Initial call taken by: Valinda Hoar,  July 19, 2009 12:18 PM  Follow-up for Phone Call        I put paperwork on Lori's desk.  Arman Filter LPN  July 19, 2009 2:09 PM   The patient was last seen by Dr. Craige Cotta on 05/08/2008. She is followed for sleep apnea and wears Bipap. She is also followed for asthma. She will need an appt for evaluation and to have forms filled out. I will hold on to the forms until she is seen for appt. Follow-up by: Michel Bickers CMA,  July 22, 2009 8:13 AM  Additional Follow-up for Phone Call Additional follow up Details #1::        pt advised and schedueld to see VS on 07/29/09 at 4:30. Carron Curie CMA  July 22, 2009 8:48 AM

## 2010-06-12 NOTE — Miscellaneous (Signed)
Summary: BPAP download 06/17/07 to 10/18/08  Clinical Lists Changes Used on 23 of 490 nights with average 1hr 44 min.    Will discuss further with pt at [facility] on August 19, 2009.

## 2010-06-12 NOTE — Progress Notes (Signed)
Summary: FYI  Phone Note From Other Clinic Call back at [phone]   Caller: debra//[facility] Call For: Bera Pinela Summary of Call: Received some papers on this patient for bariatric surgery, stated patient has to attend a sseminar before this surgery can be scheduled, the # for [phone]. Initial call taken by: Darletta Moll,  July 30, 2009 3:54 PM  Follow-up for Phone Call        Pt states she attended the seminar already. I advised her to call [facility] at the number given and ask to speak to debra to see what she needs to do to prove to them she ahas already attended. Pt staets she will do so. Carron Curie CMA  July 30, 2009 4:15 PM

## 2010-06-12 NOTE — Procedures (Signed)
Summary: Oximetry/[facility]  Oximetry/[facility]   Imported By: Sherian Rein 08/29/2009 11:08:52  _____________________________________________________________________  External Attachment:    Type:   Image     Comment:   External Document

## 2010-06-13 NOTE — Procedures (Signed)
Summary: Oximetry/[facility]  Oximetry/[facility]   Imported By: Lanelle Bal 08/22/2009 10:46:31  _____________________________________________________________________  External Attachment:    Type:   Image     Comment:   External Document

## 2010-06-26 NOTE — Procedures (Signed)
Summary: Oximetry/[facility]  Oximetry/[facility]   Imported By: Lester Te[place] 06/19/2010 08:00:06  _____________________________________________________________________  External Attachment:    Type:   Image     Comment:   External Document

## 2010-07-18 ENCOUNTER — Inpatient Hospital Stay (INDEPENDENT_AMBULATORY_CARE_PROVIDER_SITE_OTHER)
Admission: RE | Admit: 2010-07-18 | Discharge: 2010-07-18 | Disposition: A | Discharge status: Home / Self Care | Payer: Self-pay | Source: Ambulatory Visit | Attending: Family Medicine | Admitting: Family Medicine

## 2010-07-18 DIAGNOSIS — M79609 Pain in unspecified limb: Secondary | ICD-10-CM

## 2010-07-26 LAB — DIFFERENTIAL
Eosinophils Relative: 2 % (ref 0–5)
Lymphocytes Relative: 28 % (ref 12–46)
Monocytes Absolute: 0.5 10*3/uL (ref 0.1–1.0)
Monocytes Relative: 3 % (ref 3–12)

## 2010-07-26 LAB — POCT I-STAT, CHEM 8
Creatinine, Ser: 0.6 mg/dL (ref 0.4–1.2)
Glucose, Bld: 102 mg/dL — ABNORMAL HIGH (ref 70–99)
Hemoglobin: 13.6 g/dL (ref 12.0–15.0)
Potassium: 4.4 mEq/L (ref 3.5–5.1)
Sodium: 138 mEq/L (ref 135–145)

## 2010-07-26 LAB — CBC
HCT: 36.4 % (ref 36.0–46.0)
MCV: 61 fL — ABNORMAL LOW (ref 78.0–100.0)

## 2010-08-20 LAB — POCT I-STAT, CHEM 8
Calcium, Ion: 1.09 mmol/L — ABNORMAL LOW (ref 1.12–1.32)
Glucose, Bld: 117 mg/dL — ABNORMAL HIGH (ref 70–99)
HCT: 45 % (ref 36.0–46.0)
Hemoglobin: 15.3 g/dL — ABNORMAL HIGH (ref 12.0–15.0)

## 2010-08-20 LAB — D-DIMER, QUANTITATIVE: D-Dimer, Quant: 0.33 ug/mL-FEU (ref 0.00–0.48)

## 2010-09-23 NOTE — Op Note (Signed)
[name], [name]                ACCOUNT NO.:  [id number]   MEDICAL RECORD NO.:  [id number]          PATIENT TYPE:  OIB   LOCATION:  2550                         FACILITY:  [facility]   PHYSICIAN:  Adolph Pollack, M.D.DATE OF BIRTH:  11-30-1970   DATE OF PROCEDURE:  06/09/2007  DATE OF DISCHARGE:                               OPERATIVE REPORT   PREOPERATIVE DIAGNOSIS:  Incarcerated ventral incisional hernia.   POSTOPERATIVE DIAGNOSIS:  Incarcerated ventral incisional hernia.   PROCEDURE:  Laparoscopic repair of incarcerated ventral incisional  hernia with mesh.   SURGEON:  Adolph Pollack, M.D.   ASSISTANT:  Almond Lint, RNFA   ANESTHESIA:  General.   INDICATIONS:  This is a 40 year old morbidly obese female (BMI 59) with  a periumbilical hernia that is longstanding.  Early this morning she  awoke with severe pain and enlargement of the hernia associated with  nausea and vomiting.  She was seen in the office and found to have an  incarcerated hernia.  She is now brought to the operating room for  emergency repair.  We have discussed the procedure and the risks  preoperatively.   TECHNIQUE:  She was brought to the operating room, placed supine on the  operating table and a general anesthetic was administered.  A Foley  catheter was placed in the bladder.  The abdominal wall was sterilely  prepped and draped.  A small 5 mm incision was made in the left upper  quadrant.  Using a 5 mm OptiView trocar access was gained into the  peritoneal cavity and pneumoperitoneum was created.  The laparoscope was  introduced and no underlying visceral or solid organ injury was noted.  I was able to visualize the hernia with incarcerated omentum in the  supraumbilical region from a previous supraumbilical incision.  I then  placed a 5 mm trocar in the right upper and lower quadrant and I was  able to reduce the hernia contents.  The omentum was viable.  The defect  was exposed.   No  intestine was in the defect.  I used a spinal needle to mark the four  quadrants of the rim of the hernia and measured 4 cm away from these.  I  then brought a piece of 12 cm x 12 cm Parietex composite mesh into the  field.  Four anchoring sutures of #1 Novofil were placed at the four  quadrants of the mesh.  The mesh was hydrated then placed into the  abdominal cavity.  It was unfurled with the nonadhesive side facing the  omentum and the rough side facing the anterior abdominal wall.  At the  12, 3, 6 and 9 o'clock positions stab wounds were made.  The anchoring  sutures were brought up across fascial bridges at these positions and  tied down initially anchoring the mesh to the abdominal wall with  adequate coverage and overlap of the defect.  I then placed a rim of  spiral tacks around the periphery to further anchor the mesh to the  anterior abdominal wall.   Following this I inspected the area  and no bleeding was noted.  No  visceral injury was noted.  I then reduced the CO2 gas and watched the  omentum approximate the mesh.  The trocars were removed.  Skin incisions  were all closed with 4-0 Monocryl subcuticular stitches.  Steri-Strips  and sterile dressings were applied.   She tolerated the procedure without any apparent complications and was  taken to recovery in satisfactory condition.  She will need to be in  stepdown on a CPAP mask as she has severe sleep apnea.  She will also be  on sliding scale insulin for type 2 diabetes.      Adolph Pollack, M.D.  Electronically Signed     TJR/MEDQ  D:  06/09/2007  T:  06/10/2007  Job:  [id number]   cc:   Duncan Dull, M.D.

## 2010-09-23 NOTE — Discharge Summary (Signed)
NAMEDYASIA, FIRESTINE                ACCOUNT NO.:  [id number]   MEDICAL RECORD NO.:  [id number]          PATIENT TYPE:  INP   LOCATION:  5010                         FACILITY:  [facility]   PHYSICIAN:  Adolph Pollack, M.D.DATE OF BIRTH:  11/04/1970   DATE OF ADMISSION:  06/09/2007  DATE OF DISCHARGE:  06/15/2007                               DISCHARGE SUMMARY   ADMITTING PHYSICIAN:  Adolph Pollack, M.D.   DISCHARGING PHYSICIAN:  Dr. Daphine Deutscher.   OPERATING SURGEON:  Adolph Pollack, M.D.   CONSULTS:  None were done.   REASON FOR ADMISSION:  Ms. Newhall is a 40 year old female who presented to  our urgent care clinic with a longstanding periumbilical incisional  hernia that became acutely larger, painful and was associated with  nausea and vomiting.  At this time the hernia was unable to be reduced.  The patient did state that she had had a bowel movement that day though.   PHYSICAL EXAMINATION:  On physical exam the patient is morbidly obese  with a BMI of 59.  Her abdomen shows a supraumbilical incarcerated with  partially reducible hernia.  This bulge is quite tender and she does  have hypoactive bowel sounds at this time.  After examination in the  urgent care office the patient was told that she needed to have an  emergency repair of her incarcerated ventral hernia.  She agreed and at  that time was admitted to [facility] for a laparoscopic ventral  incisional hernia repair.   ADMITTING DIAGNOSES:  1. Incarcerated ventral incisional hernia.  2. Morbid obesity.  3. Sleep apnea.  4. Diabetes mellitus.   HOSPITAL COURSE:  Once the patient arrived to the hospital she was  admitted and taken to the OR for a laparoscopic incarcerated ventral  incisional hernia repair with mesh.  The patient tolerated this  procedure well and at that time was admitted to a step-down unit for  continuous pulse oximetry as well as observation for her sleep apnea.  The patient was given a  CPAP machine to wear for her obstructive sleep  apnea.  On postoperative day 1 the patient admits that she did not wear  her CPAP machine while sleeping.  However, she kept her oxygen  saturations above 92%.  Also on postoperative day 1 the patient was  complaining of some nausea with the chicken broth and coffee she was  eating, otherwise she was tolerating the rest of her clear liquids.  The  patient was also saying that she at this time was not passing flatus.  The patient had her Foley removed that morning as well.  At this time  the patient was continued on her clear liquids but was told that if she  began to have nausea or emesis that we would have to back her off n.p.o.  At this time she was also transferred to a regular bed but continuous  pulse oximetry was continued at this time.  Over the next several days  the patient was still continuing to complain of some abdominal pain,  however, her nausea was getting  better and her diet was advanced as  tolerated.  By postoperative day 4 the patient was tolerating a regular  diet, however, was still complaining of some abdominal pain and at this  time was protesting being discharged.  She was stating that today she  was too sore to leave.  At this time she stated that she would like to  be discharged on the following day.  On the following day, however, when  the patient was seen she was complaining of depression and was tearful  on exam as well as she states that she was unable to control her urine  and had urinated all over herself several times that morning.  This was  something new that had not been causing her problems over the past  several days and raised concern.  At this time a PT and OT consult was  arranged to assess the patient's unwillingness to go home to make sure  that the patient could perform all of her activities of daily living.  Case management was also brought in because the patient voiced concern  saying that she  had some issues at home as well as some issues with  her husband.  However, the patient would not go into any further  explanation of this.  Once again, the patient was reluctant to go home  and at this time asked for someone from psychiatry to come in and help  her with her depression.  Also, because of her new episodes of urinary  incontinence an I and O cath was done to check for urinary tract  infection.  However, her UA came back and showed that the patient did  not have a urinary tract infection.  The patient was also started on  Pyridium to help control bladder spasms which seemed to help.  On  postoperative day 6 the patient says that she is feeling better and no  longer peeing on myself.  She says that she slept through the night and  at this time is wanting to go home.  She still says that she is having  some pain in her belly but not as bad.  She is passing gas and having  bowel movements.  She is also now able to control her voiding.  A  psychiatry consult was called in on postoperative day 5.  However, by  the time that we got around to see her on postoperative day 6 it had not  been fulfilled and probably will not be fulfilled by the time that she  leaves for discharge.  Based on PT and OT evaluation home health PT is  recommended for the patient as well.  At this time the patient is stable  to be discharged home.   DISCHARGE DIAGNOSES:  1. Incarcerated ventral incisional hernia.  2. Status post laparoscopic ventral incisional hernia repair.  3. Urinary incontinence which is resolved.  4. Morbid obesity.  5. Obstructive sleep apnea.  6. Diabetes mellitus type 2.   MEDICATIONS:  The patient is told at this time to continue all home  medications which include metformin, levothyroxine and she was given a  prescription for Tylox as well.   DISCHARGE INSTRUCTIONS:  The patient is told that she has no  restrictions on her diet.  She is informed that she may shower,  however,  she is not to bathe for at least the next 2 weeks, no lifting for 6  weeks anything greater than 10 pounds.  She was also  informed not to  drive for the next 2 or 3 weeks.  She is to call our office for a high  fever greater than 101.5 or persistent vomiting or any problems with her  wound including pus-like drainage, increased redness or warmth.  At the  time of discharge she is informed that she is to return to see Dr.  Abbey Chatters in followup in 2-3 weeks and she is to call for that  appointment.      Letha Cape, PA      Adolph Pollack, M.D.  Electronically Signed   KEO/MEDQ  D:  06/15/2007  T:  06/15/2007  Job:  [id number]   cc:   Adolph Pollack, M.D.

## 2010-09-23 NOTE — Procedures (Signed)
[name], [name] NO.:  [id number]   MEDICAL RECORD NO.:  [id number]          PATIENT TYPE:  OUT   LOCATION:  SLEEP CENTER                 FACILITY:  [facility]   PHYSICIAN:  Coralyn Helling, MD        DATE OF BIRTH:  1970-11-07   DATE OF STUDY:                            NOCTURNAL POLYSOMNOGRAM   REFERRING PHYSICIAN:  Coralyn Helling, MD   INDICATION FOR STUDY:  [name] is a 40 year old female who had a sleep  study on June 18, 2007 which showed an apnea/hypopnea index of 145  and an oxygen saturation nadir of 54%.  She was initially unable to  afford therapy for this.  She has since obtained financial resources to  pursue further therapy.  She is referred to the sleep lab for a CPAP  titration study.   Height is 5 feet 2 inches, weight is 327 pounds, BMI is 60, neck size is  18.   EPWORTH SLEEPINESS SCORE:  8.   MEDICATIONS:  Metformin, levothyroxine, Nasonex, ProAir, and Advair.   SLEEP ARCHITECTURE:  Total recording time was 442 minutes.  Total sleep  time was 417 minutes.  Sleep efficiency is 94%.  Sleep latency is 3  minutes, which is significantly reduced.  REM latency is 73 minutes.  The patient was observed in all stages of sleep and slept in both the  supine and nonsupine positions.   RESPIRATORY DATA:  The average respiratory rate was 20.  The patient was  titrated from a CPAP pressure setting of 5 to 23 cmH2O.  At a CPAP  pressure setting of 17, the apnea/hypopnea index was reduced to 0.  At  this pressure setting, the patient was observed in REM sleep and supine  sleep.  Snoring was eliminated.   OXYGEN DATA:  The baseline oxygenation was 95%.  The oxygen saturation  nadir was 71%.  At a CPAP pressure setting of 17 cmH2O, the oxygen  saturation nadir was 80%, and the patient had frequent episodes of  oxygen desaturation in the absence of respiratory events.  The study was  done without the use of supplemental oxygen.   CARDIAC DATA:  The average  heart rate was 82, and the rhythm strip  showed normal sinus rhythm.   MOVEMENT-PARASOMNIA:  The periodic limb movement index was 0.  The  patient had one restroom trip.   IMPRESSIONS-RECOMMENDATIONS:  This was a CPAP titration study.  At a  CPAP pressure setting of 17, the patient had achieved good control of  her respiratory events, with a reduction in her apnea/hypopnea index to  0.  At this pressure setting, she was observed in both REM sleep and  supine sleep, and snoring was eliminated.   However, she continued to have persistent episodes of oxygen  desaturation in the absence of respiratory events, which would be  consistent with nocturnal hypoventilation.   What I would recommend is to start the patient on CPAP at 17 cmH2O, as  well as the addition of 2 liters of supplemental oxygen.  She should  then undergo an overnight oximetry to determine if any additional  interventions are necessary.  If she is unable to tolerate the high pressures on her CPAP machine, or  if she has persistent hypoxemia, an additional consideration could be to  have her undergo a BiPAP titration study.   The patient needs to receive aggressive counseling with regards to the  importance of diet, exercise, and weight reduction.      Coralyn Helling, MD  Diplomat, American Board of Sleep Medicine  Electronically Signed     VS/MEDQ  D:  11/30/2007 12:23:40  T:  11/30/2007 12:58:07  Job:  [id number]

## 2010-09-26 NOTE — Op Note (Signed)
[name], [name] NO.:  [id number]   MEDICAL RECORD NO.:  [id number]                   PATIENT TYPE:  OIB   LOCATION:  2550                                 FACILITY:  [facility]   PHYSICIAN:  Gita Kudo, M.D.              DATE OF BIRTH:  06/18/1970   DATE OF PROCEDURE:  11/13/2003  DATE OF DISCHARGE:                                 OPERATIVE REPORT   PREOPERATIVE DIAGNOSIS:  Gallstones.   POSTOPERATIVE DIAGNOSES:  1. Gallstones.  2. Morbid obesity.  3. Normal cholangiogram.   OPERATIVE PROCEDURE:  Laparoscopic cholecystectomy with intraoperative  cholangiogram.   SURGEON:  Gita Kudo, M.D.   ASSISTANT:  Anselm Pancoast. Zachery Dakins, M.D.   ANESTHESIA:  General endotracheal.   CLINICAL SUMMARY:  A 40 year old female with bouts of abdominal pain and  gallbladder ultrasound showing stones.  Elevated white count, slightly  tender in the right upper quadrant.  Liver function studies normal.  Brought  in for elective operation.   OPERATIVE FINDINGS:  The surgery was somewhat difficult because of the  patient's large size, but it was done safely and with good exposure.  The  gallbladder was actually thin-walled and had several large stones in it.  The cystic duct and artery were normal in size and the cholangiogram  appeared normal.   OPERATIVE PROCEDURE:  Under satisfactory general endotracheal anesthesia,  having received 1.0 g Ancef preop, the patient's abdomen was prepped and  draped in a standard fashion.  A total of 30 mL of 0.5% Marcaine with  epinephrine was infiltrated for postop analgesia during the procedure.  A  transverse incision made above the umbilicus within the midline opened into  the peritoneum.  A small umbilical hernia was incorporated into the wound  for closure later.  Then operating Hasson port inserted, secured, and good  CO2 pneumoperitoneum established.  The camera placed and under direct  vision, two #5 ports  placed laterally and a second #10 medially.  The  patient was positioned and dissection accomplished slowly because of the  difficulty with good CO2 pneumoperitoneum just because of her size.  However, the visualization was quite good and we were able to  circumferentially dissect her cystic artery, verify it, control it with  multiple clips, and divide it.  Then the cystic artery was serially  dissected freely, clipped close to the gallbladder, and cannulated.  A good  cholangiogram was obtained.  The catheter was withdrawn and the duct  controlled with multiple clips and divided.  The gallbladder was then  removed from below upward using the coagulating current for hemostasis and  dissection.  A small hole made in the gallbladder and clear bile suctioned  away.  After it was removed from the liver bed, it was placed in an  EndoCatch bag.  The liver bed was checked for hemostasis and made dry by  cautery, lavaged with saline.  A second liter of solution was used to  irrigate the abdomen totally, and the returns were clear.   The camera moved to the upper port and a large grasper through the umbilicus  was used to retrieve the enclosed gallbladder in the bag intact and without  further spillage or problem.  Then the operative site was checked for  hemostasis, CO2 released, ports removed.  The midline was then closed with  interrupted figure-of-eight 0 Prolene suture as well as the figure-of-eight  0 Vicryl suture that had been placed previously.  Then the subcu was  approximated with 4-0 Vicryl and Steri-Strips used to approximate the skin.  There were no complications, sponge and needle counts correct.  Patient to  the recovery room in good condition.                                               Gita Kudo, M.D.    MRL/MEDQ  D:  11/13/2003  T:  11/13/2003  Job:  [id number]   cc:   Duncan Dull, M.D.  [address]  [place]  [place] [address]  Fax: [phone]

## 2010-09-26 NOTE — Procedures (Signed)
[name], [name]                ACCOUNT NO.:  [id number]   MEDICAL RECORD NO.:  [id number]          PATIENT TYPE:  OUT   LOCATION:  SLEEP CENTER                 FACILITY:  [facility]   PHYSICIAN:  Clinton D. Maple Hudson, MD, FCCP, FACPDATE OF BIRTH:  08[birth year]   DATE OF STUDY:                            NOCTURNAL POLYSOMNOGRAM   DATE OF STUDY:  06/17/2006   REFERRING PHYSICIAN:  Duncan Dull MD   INDICATION FOR STUDY:  Hypersomnia with sleep apnea.   EPWORTH SLEEPINESS SCORE:  13/24.  BMI 60.  Weight 320 pounds.   MEDICATIONS:  Home medications listed and reviewed.   SLEEP ARCHITECTURE:  Total sleep time 398 minutes with sleep efficiency  86%.  Stage I was 8%, stage II 74%, stages III and IV 1%, REM 17% of  total sleep time.  Sleep latency 25 minutes, REM latency 88 minutes,  awake after sleep onset 38 minutes, arousal index 17.2.  Bedtime  medications included Advair and Proventil inhaler.   RESPIRATORY DATA:  Diagnostic NPSG protocol as requested.  Apnea/hypopnea index (AHI, RDI) 145 obstructive events per hour  indicating very severe obstructive sleep apnea/hypopnea syndrome.  There  were 10 central apneas, 422 obstructive apneas, and 531 hypopneas.  Events were not positional.  REM AHI 148 per hour.   OXYGEN DATA:  Moderate snoring with sustained oxygen desaturations to a  nadir of 54%.  Mean oxygen saturation through the study was 83% on room  air.  Because of persistent hypoxia, the technician added nasal prong  oxygen at 1 liter per minute at 3:49 a.m. which improved oxygen  saturation into the range between 80-90%.   CARDIAC DATA:  Normal sinus rhythm.   MOVEMENT-PARASOMNIA:  No significant limb jerks or unusual movement.  Note that the patient was noted to yelp and moan a few times in sleep.   IMPRESSIONS-RECOMMENDATIONS:  1. Very severe obstructive sleep apnea/hypopnea syndrome, AHI 145 per      hour with nonpositional events, moderate snoring, and oxygen  desaturation to a nadir of 54%.  2. Sustained and significant oxygen desaturation throughout the study      with a mean saturation of 83% on room air.  The technician added      oxygen at 1 liter per minute, achieving some improvement.  3. Consider early return for CPAP titration or evaluate for      alternative therapies as appropriate.  Adequate CPAP control may      remove the need for supplemental oxygen.      Clinton D. Maple Hudson, MD, [facility], FACP  Diplomate, Biomedical engineer of Sleep Medicine  Electronically Signed     CDY/MEDQ  D:  06/20/2006 14:34:15  T:  06/20/2006 19:40:20  Job:  [id number]

## 2011-01-29 LAB — POCT I-STAT 4, (NA,K, GLUC, HGB,HCT)
Glucose, Bld: 96
Operator id: 206361

## 2011-01-29 LAB — HCG, SERUM, QUALITATIVE: Preg, Serum: NEGATIVE

## 2011-01-29 LAB — CREATININE, SERUM
Creatinine, Ser: 0.61
GFR calc Af Amer: >60

## 2011-01-30 LAB — CBC
HCT: 33.5 — ABNORMAL LOW
MCV: 61 — ABNORMAL LOW
Platelets: 250
RDW: 20.6 — ABNORMAL HIGH

## 2011-01-30 LAB — URINALYSIS, ROUTINE W REFLEX MICROSCOPIC
Bilirubin Urine: NEGATIVE
Ketones, ur: NEGATIVE
Nitrite: NEGATIVE
Urobilinogen, UA: 0.2

## 2011-01-30 LAB — BASIC METABOLIC PANEL
BUN: 11
Chloride: 102
Glucose, Bld: 110 — ABNORMAL HIGH
Potassium: 4.3

## 2011-01-30 LAB — URINE CULTURE: Colony Count: NO GROWTH

## 2011-03-31 ENCOUNTER — Other Ambulatory Visit (HOSPITAL_COMMUNITY): Payer: Self-pay | Admitting: Obstetrics & Gynecology

## 2011-04-15 ENCOUNTER — Institutional Professional Consult (permissible substitution): Payer: Self-pay | Admitting: Cardiology

## 2011-04-15 ENCOUNTER — Encounter: Payer: Self-pay | Admitting: Cardiology

## 2011-04-15 ENCOUNTER — Ambulatory Visit (INDEPENDENT_AMBULATORY_CARE_PROVIDER_SITE_OTHER): Payer: Self-pay | Admitting: Cardiology

## 2011-04-15 DIAGNOSIS — R1013 Epigastric pain: Secondary | ICD-10-CM | POA: Insufficient documentation

## 2011-04-15 DIAGNOSIS — K3189 Other diseases of stomach and duodenum: Secondary | ICD-10-CM

## 2011-04-15 DIAGNOSIS — R079 Chest pain, unspecified: Secondary | ICD-10-CM

## 2011-04-15 DIAGNOSIS — E039 Hypothyroidism, unspecified: Secondary | ICD-10-CM

## 2011-04-15 MED ORDER — PANTOPRAZOLE SODIUM 40 MG PO TBEC: 40.0000 mg | DELAYED_RELEASE_TABLET | Freq: Two times a day (BID) | ORAL | Status: DC

## 2011-04-15 NOTE — Assessment & Plan Note (Signed)
The patient has frequent dyspepsia.  She has found that protonic 40 mg twice a day as effectively abolished the symptoms as long she takes it regularly.

## 2011-04-15 NOTE — Progress Notes (Signed)
[name] Date of Birth:  16-Nov-1970 [facility] / [facility] [address]   [address] [place], [place]  [address] [phone]           Fax   [phone]  History of Present Illness: This pleasant 40 year old woman is seen for a new patient office visit.  We see several of her family members.  The patient is concerned about her heart.  She has an interesting past history.  At one time she weighed almost on 370 pounds. She underwent a gastric bypass at [facility] and her weight is down approximately 200 pounds from its peak.  She is now waiting to have plastic surgery for breast reduction and tummy tuck and removal of excess tissue from under her arms.  The patient was a diabetic prior to her gastric bypass.  When she had the gastric bypass her blood sugars normalized almost immediately.  He had a recent checkup with her gastric bypass physician Dr. Clent Ridges who noted that her serum albumin and calcium were now low and he put her on protein bars and vitamins and she is eating Tums for calcium.  The patient has a history of dyspepsia and has seen Dr. Lynda Rainwater who has her on Protonix 40 mg twice a day.  The patient has a history of hypothyroidism and sees Dr. Cleon Gustin and is on Synthroid.  Current Outpatient Prescriptions  Medication Sig Dispense Refill  . Biotin 1000 MCG tablet Take 1,000 mcg by mouth daily.        . Calcium Carbonate Antacid (TUMS PO) Take by mouth 3 (three) times daily.        . Cholecalciferol (VITAMIN D-3 PO) Take 2,000 Int'l Units by mouth 2 (two) times daily.        . ferrous sulfate 325 (65 FE) MG tablet Take 325 mg by mouth daily with breakfast.        . levothyroxine (SYNTHROID, LEVOTHROID) 125 MCG tablet Take 125 mcg by mouth daily.        . Multiple Vitamin (MULTIVITAMIN) tablet Take 1 tablet by mouth 3 (three) times daily.        . pantoprazole (PROTONIX) 40 MG tablet Take 1 tablet (40 mg total) by mouth 2 (two) times daily.  180 tablet  3     No Known Allergies  Patient Active Problem List  Diagnoses  . MORBID OBESITY  . SLEEP APNEA, OBSTRUCTIVE  . ALLERGIC RHINITIS  . ASTHMA  . HYPOXEMIA  . OBESITY HYPOVENTILATION SYNDROME  . Chest pain    History  Smoking status  . Never Smoker   Smokeless tobacco  . Not on file    History  Alcohol Use: Not on file    Family History  Problem Relation Age of Onset  . Heart disease Father     Review of Systems: Constitutional: no fever chills diaphoresis or fatigue or change in weight.  Head and neck: no hearing loss, no epistaxis, no photophobia or visual disturbance. Respiratory: No cough, shortness of breath or wheezing. Cardiovascular: No chest pain peripheral edema, palpitations. Gastrointestinal: No abdominal distention, no abdominal pain, no change in bowel habits hematochezia or melena. Genitourinary: No dysuria, no frequency, no urgency, no nocturia. Musculoskeletal:No arthralgias, no back pain, no gait disturbance or myalgias. Neurological: No dizziness, no headaches, no numbness, no seizures, no syncope, no weakness, no tremors. Hematologic: No lymphadenopathy, no easy bruising. Psychiatric: No confusion, no hallucinations, no sleep disturbance.    Physical Exam: Filed Vitals:  04/15/11 1427  BP: 100/68  Pulse: 52   the general appearance reveals a well-developed well-nourished woman in no distress.Pupils equal and reactive.   Extraocular Movements are full.  There is no scleral icterus.  The mouth and pharynx are normal.  The neck is supple.  The carotids reveal no bruits.  The jugular venous pressure is normal.  The thyroid is not enlarged.  There is no lymphadenopathy.  The chest is clear to percussion and auscultation. There are no rales or rhonchi. Expansion of the chest is symmetrical.  The precordium is quiet.  The first heart sound is normal.  The second heart sound is physiologically split.  There is no murmur gallop rub or click.  There is no  abnormal lift or heave.  The abdomen is soft and nontender. Bowel sounds are normal. The liver and spleen are not enlarged. There Are no abdominal masses. There are no bruits.  The pedal pulses are good.  There is no phlebitis or edema.  There is no cyanosis or clubbing. Strength is normal and symmetrical in all extremities.  There is no lateralizing weakness.  There are no sensory deficits.  The skin is warm and dry.  There is no rash.  EKG shows sinus bradycardia at 52 per minute and no ischemic changes.  Assessment / Plan: Musculoskeletal chest wall pain.  Pain does not sound ischemic.  The patient was reassured.  She swims regularly for exercise at the club and does not experience any exertional symptoms.  No further workup needed at this time and recheck here when necessary.  From a medical standpoint she should be stable for proposed plastic surgery

## 2011-04-15 NOTE — Assessment & Plan Note (Signed)
The patient is clinically euthyroid on her present dose of Synthroid 

## 2011-04-15 NOTE — Patient Instructions (Signed)
Your physician recommends that you continue on your current medications as directed. Please refer to the Current Medication list given to you today. Follow up here as needed

## 2011-04-15 NOTE — Assessment & Plan Note (Signed)
The patient has occasional chest discomfort just to the left of the sternum.  It is not related to exertion.  It does not radiate.  It is sharp in character and this is suggestive of musculoskeletal rather than ischemia.  Her electrocardiogram today is normal and shows no ischemic changes.  She does not currently have insurance and desires to keep medical cost at a minimum.

## 2012-07-18 ENCOUNTER — Other Ambulatory Visit: Payer: Self-pay | Admitting: *Deleted

## 2012-07-18 DIAGNOSIS — R1013 Epigastric pain: Secondary | ICD-10-CM

## 2012-07-18 MED ORDER — PANTOPRAZOLE SODIUM 40 MG PO TBEC: 40.0000 mg | DELAYED_RELEASE_TABLET | Freq: Two times a day (BID) | ORAL | Status: DC

## 2013-02-16 ENCOUNTER — Emergency Department (HOSPITAL_COMMUNITY)
Admission: EM | Admit: 2013-02-16 | Discharge: 2013-02-17 | Disposition: A | Discharge status: Home / Self Care | Payer: Self-pay | Attending: Emergency Medicine | Admitting: Emergency Medicine

## 2013-02-16 ENCOUNTER — Emergency Department (HOSPITAL_COMMUNITY): Payer: Self-pay

## 2013-02-16 ENCOUNTER — Encounter (HOSPITAL_COMMUNITY): Payer: Self-pay | Admitting: Emergency Medicine

## 2013-02-16 DIAGNOSIS — Z79899 Other long term (current) drug therapy: Secondary | ICD-10-CM | POA: Insufficient documentation

## 2013-02-16 DIAGNOSIS — S60229A Contusion of unspecified hand, initial encounter: Secondary | ICD-10-CM | POA: Insufficient documentation

## 2013-02-16 DIAGNOSIS — S60221A Contusion of right hand, initial encounter: Secondary | ICD-10-CM

## 2013-02-16 DIAGNOSIS — E079 Disorder of thyroid, unspecified: Secondary | ICD-10-CM | POA: Insufficient documentation

## 2013-02-16 DIAGNOSIS — S0990XA Unspecified injury of head, initial encounter: Secondary | ICD-10-CM | POA: Insufficient documentation

## 2013-02-16 MED ORDER — OXYCODONE-ACETAMINOPHEN 5-325 MG PO TABS
2.0000 | ORAL_TABLET | Freq: Once | ORAL | Status: AC
  Administered 2013-02-16: 2 via ORAL
  Filled 2013-02-16: qty 2
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
## 2013-03-02 ENCOUNTER — Telehealth: Payer: Self-pay | Admitting: *Deleted

## 2013-03-02 NOTE — Telephone Encounter (Signed)
Patient states she doesn't get her synthroid from Dr Patty Sermons anymore, she has plenty and doesn't need a refill. Will send a message to pharmacy--[facility] [place].

## 2013-05-21 ENCOUNTER — Observation Stay (HOSPITAL_COMMUNITY)
Admission: EM | Admit: 2013-05-21 | Discharge: 2013-05-23 | Disposition: A | Discharge status: Home / Self Care | Payer: Self-pay | Attending: Internal Medicine | Admitting: Internal Medicine

## 2013-05-21 ENCOUNTER — Encounter (HOSPITAL_COMMUNITY): Payer: Self-pay | Admitting: Emergency Medicine

## 2013-05-21 ENCOUNTER — Emergency Department (HOSPITAL_COMMUNITY): Payer: Self-pay

## 2013-05-21 DIAGNOSIS — Z6841 Body Mass Index (BMI) 40.0 and over, adult: Secondary | ICD-10-CM | POA: Insufficient documentation

## 2013-05-21 DIAGNOSIS — G4733 Obstructive sleep apnea (adult) (pediatric): Secondary | ICD-10-CM

## 2013-05-21 DIAGNOSIS — Z9189 Other specified personal risk factors, not elsewhere classified: Secondary | ICD-10-CM | POA: Diagnosis present

## 2013-05-21 DIAGNOSIS — E119 Type 2 diabetes mellitus without complications: Secondary | ICD-10-CM

## 2013-05-21 DIAGNOSIS — E039 Hypothyroidism, unspecified: Secondary | ICD-10-CM | POA: Diagnosis present

## 2013-05-21 DIAGNOSIS — Z9884 Bariatric surgery status: Secondary | ICD-10-CM | POA: Insufficient documentation

## 2013-05-21 DIAGNOSIS — E669 Obesity, unspecified: Secondary | ICD-10-CM | POA: Diagnosis present

## 2013-05-21 DIAGNOSIS — D563 Thalassemia minor: Secondary | ICD-10-CM

## 2013-05-21 DIAGNOSIS — J309 Allergic rhinitis, unspecified: Secondary | ICD-10-CM

## 2013-05-21 DIAGNOSIS — D509 Iron deficiency anemia, unspecified: Secondary | ICD-10-CM

## 2013-05-21 DIAGNOSIS — E662 Morbid (severe) obesity with alveolar hypoventilation: Secondary | ICD-10-CM | POA: Diagnosis present

## 2013-05-21 DIAGNOSIS — R0789 Other chest pain: Secondary | ICD-10-CM

## 2013-05-21 DIAGNOSIS — R1013 Epigastric pain: Secondary | ICD-10-CM

## 2013-05-21 DIAGNOSIS — J45909 Unspecified asthma, uncomplicated: Secondary | ICD-10-CM

## 2013-05-21 DIAGNOSIS — K219 Gastro-esophageal reflux disease without esophagitis: Secondary | ICD-10-CM | POA: Diagnosis present

## 2013-05-21 DIAGNOSIS — R0902 Hypoxemia: Secondary | ICD-10-CM

## 2013-05-21 DIAGNOSIS — R079 Chest pain, unspecified: Principal | ICD-10-CM | POA: Diagnosis present

## 2013-05-21 HISTORY — DX: Gastro-esophageal reflux disease without esophagitis: K21.9

## 2013-05-21 LAB — CBC
HCT: 32.9 % — ABNORMAL LOW (ref 36.0–46.0)
HEMOGLOBIN: 9.8 g/dL — AB (ref 12.0–15.0)
MCH: 16 pg — AB (ref 26.0–34.0)
MCHC: 29.8 g/dL — AB (ref 30.0–36.0)
MCV: 53.8 fL — ABNORMAL LOW (ref 78.0–100.0)
Platelets: 339 10*3/uL (ref 150–400)
RBC: 6.11 MIL/uL — ABNORMAL HIGH (ref 3.87–5.11)
RDW: 19.1 % — AB (ref 11.5–15.5)
WBC: 14.3 10*3/uL — ABNORMAL HIGH (ref 4.0–10.5)

## 2013-05-21 LAB — BASIC METABOLIC PANEL
BUN: 10 mg/dL (ref 6–23)
CALCIUM: 7.9 mg/dL — AB (ref 8.4–10.5)
CO2: 25 mEq/L (ref 19–32)
Chloride: 100 mEq/L (ref 96–112)
Creatinine, Ser: 0.49 mg/dL — ABNORMAL LOW (ref 0.50–1.10)
GLUCOSE: 94 mg/dL (ref 70–99)
POTASSIUM: 3.9 meq/L (ref 3.7–5.3)
SODIUM: 138 meq/L (ref 137–147)

## 2013-05-21 LAB — POCT I-STAT TROPONIN I: Troponin i, poc: 0.01 ng/mL (ref 0.00–0.08)

## 2013-05-21 MED ORDER — ASPIRIN 81 MG PO CHEW
324.0000 mg | CHEWABLE_TABLET | Freq: Once | ORAL | Status: AC
  Administered 2013-05-21: 324 mg via ORAL
  Filled 2013-05-21: qty 4

## 2013-05-21 NOTE — ED Notes (Signed)
Pt is here with chest pain and radiates down right arm and now started having headaches
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
## 2013-05-22 ENCOUNTER — Encounter (HOSPITAL_COMMUNITY): Payer: Self-pay | Admitting: Internal Medicine

## 2013-05-22 ENCOUNTER — Observation Stay (HOSPITAL_COMMUNITY): Payer: Self-pay

## 2013-05-22 ENCOUNTER — Observation Stay (HOSPITAL_COMMUNITY): Payer: MEDICAID

## 2013-05-22 DIAGNOSIS — K219 Gastro-esophageal reflux disease without esophagitis: Secondary | ICD-10-CM | POA: Diagnosis present

## 2013-05-22 DIAGNOSIS — R079 Chest pain, unspecified: Secondary | ICD-10-CM

## 2013-05-22 DIAGNOSIS — E119 Type 2 diabetes mellitus without complications: Secondary | ICD-10-CM

## 2013-05-22 DIAGNOSIS — K3189 Other diseases of stomach and duodenum: Secondary | ICD-10-CM

## 2013-05-22 DIAGNOSIS — E669 Obesity, unspecified: Secondary | ICD-10-CM | POA: Diagnosis present

## 2013-05-22 DIAGNOSIS — R1013 Epigastric pain: Secondary | ICD-10-CM

## 2013-05-22 DIAGNOSIS — R0789 Other chest pain: Secondary | ICD-10-CM

## 2013-05-22 LAB — TSH: TSH: 3.746 u[IU]/mL (ref 0.350–4.500)

## 2013-05-22 LAB — D-DIMER, QUANTITATIVE (NOT AT ARMC): D DIMER QUANT: 0.45 ug{FEU}/mL (ref 0.00–0.48)

## 2013-05-22 LAB — TROPONIN I: Troponin I: <0.3 ng/mL (ref <0.30)

## 2013-05-22 MED ORDER — TECHNETIUM TC 99M SESTAMIBI GENERIC - CARDIOLITE
10.0000 | Freq: Once | INTRAVENOUS | Status: AC | PRN
  Administered 2013-05-22: 10 via INTRAVENOUS

## 2013-05-22 MED ORDER — DOCUSATE SODIUM 100 MG PO CAPS
100.0000 mg | ORAL_CAPSULE | Freq: Two times a day (BID) | ORAL | Status: DC
  Filled 2013-05-22 (×4): qty 1

## 2013-05-22 MED ORDER — REGADENOSON 0.4 MG/5ML IV SOLN
0.4000 mg | Freq: Once | INTRAVENOUS | Status: AC
  Administered 2013-05-22: 0.4 mg via INTRAVENOUS

## 2013-05-22 MED ORDER — ACETAMINOPHEN 500 MG PO TABS
500.0000 mg | ORAL_TABLET | Freq: Every day | ORAL | Status: DC | PRN
Start: 2013-05-22 — End: 2013-05-23

## 2013-05-22 MED ORDER — REGADENOSON 0.4 MG/5ML IV SOLN
INTRAVENOUS | Status: AC
  Filled 2013-05-22: qty 5

## 2013-05-22 MED ORDER — ONDANSETRON HCL 4 MG PO TABS: 4.0000 mg | ORAL_TABLET | Freq: Four times a day (QID) | ORAL | Status: DC | PRN

## 2013-05-22 MED ORDER — SODIUM CHLORIDE 0.9 % IJ SOLN
3.0000 mL | Freq: Two times a day (BID) | INTRAMUSCULAR | Status: DC
  Administered 2013-05-22 (×3): 3 mL via INTRAVENOUS

## 2013-05-22 MED ORDER — ONDANSETRON HCL 4 MG/2ML IJ SOLN
4.0000 mg | Freq: Four times a day (QID) | INTRAMUSCULAR | Status: DC | PRN
Start: 2013-05-22 — End: 2013-05-23

## 2013-05-22 MED ORDER — ADULT MULTIVITAMIN W/MINERALS CH
1.0000 | ORAL_TABLET | Freq: Every day | ORAL | Status: DC
  Administered 2013-05-22 – 2013-05-23 (×2): 1 via ORAL
  Filled 2013-05-22 (×2): qty 1

## 2013-05-22 MED ORDER — MORPHINE SULFATE 2 MG/ML IJ SOLN
2.0000 mg | INTRAMUSCULAR | Status: DC | PRN
  Administered 2013-05-22: 2 mg via INTRAVENOUS
  Filled 2013-05-22: qty 1

## 2013-05-22 MED ORDER — PANTOPRAZOLE SODIUM 40 MG PO TBEC
40.0000 mg | DELAYED_RELEASE_TABLET | Freq: Two times a day (BID) | ORAL | Status: DC
  Administered 2013-05-22 – 2013-05-23 (×3): 40 mg via ORAL
  Filled 2013-05-22 (×3): qty 1

## 2013-05-22 MED ORDER — TECHNETIUM TC 99M SESTAMIBI GENERIC - CARDIOLITE
30.0000 | Freq: Once | INTRAVENOUS | Status: AC | PRN
  Administered 2013-05-22: 30 via INTRAVENOUS

## 2013-05-22 MED ORDER — ENOXAPARIN SODIUM 40 MG/0.4ML ~~LOC~~ SOLN
40.0000 mg | SUBCUTANEOUS | Status: DC
  Administered 2013-05-22: 40 mg via SUBCUTANEOUS
  Filled 2013-05-22 (×2): qty 0.4

## 2013-05-22 NOTE — Progress Notes (Signed)
PATIENT DETAILS Name: [name] Age: 43 y.o. Sex: female Date of Birth: 12/31/1970 Admit Date: 05/21/2013 Admitting Physician Orvan Falconer, MD AST:[id number], MD  Subjective: No more chest pain  Assessment/Plan: Active Problems: Chest Pain -seen by Cards -Nuc Stress test neg-spoke with Dr Kandace Parkins needs to be done  GERD -PPI  Hx of DM -previously was on medications-but after gastric bypass-not taking any meds -follows with Dr Chalmers Cater as outpatient.  Disposition: Remain inpatient  DVT Prophylaxis: Prophylactic Lovenox   Code Status: Full code  Family Communication None at bedside  Procedures:  None  CONSULTS:  cardiology  MEDICATIONS: Scheduled Meds: . docusate sodium  100 mg Oral BID  . multivitamin with minerals  1 tablet Oral Daily  . pantoprazole  40 mg Oral BID  . sodium chloride  3 mL Intravenous Q12H   Continuous Infusions:  PRN Meds:.acetaminophen, morphine injection, ondansetron (ZOFRAN) IV, ondansetron  Antibiotics: Anti-infectives   None       PHYSICAL EXAM: Vital signs in last 24 hours: Filed Vitals:   05/22/13 1126 05/22/13 1128 05/22/13 1300 05/22/13 1302  BP: 88/65 97/65 117/67 141/99  Pulse:   70 118  Temp:   98.1 F (36.7 C)   TempSrc:   Oral   Resp:   18 18  Height:      Weight:      SpO2:   100% 100%    Weight change:  Filed Weights   05/21/13 1816 05/22/13 0128  Weight: 101.606 kg (224 lb) 101.4 kg (223 lb 8.7 oz)   Body mass index is 40.88 kg/(m^2).   Gen Exam: Awake and alert with clear speech.   Neck: Supple, No JVD.   Chest: B/L Clear.   CVS: S1 S2 Regular, no murmurs.  Abdomen: soft, BS +, non tender, non distended.  Extremities: no edema, lower extremities warm to touch. Neurologic: Non Focal.   Skin: No Rash.   Wounds: N/A.   Intake/Output from previous day: No intake or output data in the 24 hours ending 05/22/13 1550   LAB RESULTS: CBC  Recent Labs Lab 05/21/13 1828  WBC  14.3*  HGB 9.8*  HCT 32.9*  PLT 339  MCV 53.8*  MCH 16.0*  MCHC 29.8*  RDW 19.1*    Chemistries   Recent Labs Lab 05/21/13 1828  NA 138  K 3.9  CL 100  CO2 25  GLUCOSE 94  BUN 10  CREATININE 0.49*  CALCIUM 7.9*    CBG: No results found for this basename: GLUCAP,  in the last 168 hours  GFR Estimated Creatinine Clearance: 101.1 ml/min (by C-G formula based on Cr of 0.49).  Coagulation profile No results found for this basename: INR, PROTIME,  in the last 168 hours  Cardiac Enzymes  Recent Labs Lab 05/22/13 0402 05/22/13 0736 05/22/13 1307  TROPONINI <0.30 <0.30 <0.30    No components found with this basename: POCBNP,   Recent Labs  05/22/13 0025  DDIMER 0.45   No results found for this basename: HGBA1C,  in the last 72 hours No results found for this basename: CHOL, HDL, LDLCALC, TRIG, CHOLHDL, LDLDIRECT,  in the last 72 hours  Recent Labs  05/22/13 0405  TSH 3.746   No results found for this basename: VITAMINB12, FOLATE, FERRITIN, TIBC, IRON, RETICCTPCT,  in the last 72 hours No results found for this basename: LIPASE, AMYLASE,  in the last 72 hours  Urine Studies No results found for this basename: UACOL, UAPR, USPG, UPH, UTP, UGL, UKET, UBIL,  UHGB, UNIT, UROB, ULEU, UEPI, UWBC, URBC, UBAC, CAST, CRYS, UCOM, BILUA,  in the last 72 hours  MICROBIOLOGY: No results found for this or any previous visit (from the past 240 hour(s)).  RADIOLOGY STUDIES/RESULTS: Dg Chest 2 View  05/21/2013   CLINICAL DATA:  Right-sided chest pain.  EXAM: CHEST  2 VIEW  COMPARISON:  12/02/2009.  FINDINGS: The cardiac silhouette, mediastinal and hilar contours are within normal limits and stable. Prominent hilar contours are unchanged. Mild chronic bronchitic type lung changes but no acute overlying pulmonary process. No pleural effusion. The bony thorax is intact.  IMPRESSION: Mild chronic bronchitic type lung changes but no acute pulmonary findings.   Electronically  Signed   By: Kalman Jewels M.D.   On: 05/21/2013 20:05   Nm Myocar Multi W/spect W/wall Motion / Ef  05/22/2013   CLINICAL DATA:  43 year old with chest pain.  EXAM: MYOCARDIAL IMAGING WITH SPECT (REST AND PHARMACOLOGIC-STRESS)  GATED LEFT VENTRICULAR WALL MOTION STUDY  LEFT VENTRICULAR EJECTION FRACTION  TECHNIQUE: Standard myocardial SPECT imaging performed after resting intravenous injection of 10 mCi Tc-64m sestimibi. Subsequently, intravenous infusion of regadenoson performed under the supervision of the Cardiology staff. At peak effect of the drug, 30 mCi Tc-55m sestimibi injected intravenously and standard myocardial SPECT imaging performed. Quantitative gated imaging also performed to evaluate left ventricular wall motion and estimate left ventricular ejection fraction.  FINDINGS: MYOCARDIAL IMAGING WITH SPECT (REST AND PHARMACOLOGIC-STRESS)  The left ventricle myocardial perfusion is within normal limits. There is no significant fixed or reversible defect.  GATED LEFT VENTRICULAR WALL MOTION STUDY  Review of the gated images demonstrates normal wall motion.  LEFT VENTRICULAR EJECTION FRACTION  QGS ejection fraction measures 69% , with an end-diastolic volume of 82 ml and an end-systolic volume of 26 ml.  IMPRESSION: No evidence for pharmacological induced ischemia.  Calculated ejection fraction is 69% with normal wall motion.   Electronically Signed   By: Markus Daft M.D.   On: 05/22/2013 14:17    Oren Binet, MD  [facility] Pager:[phone]  If 7PM-7AM, please contact night-coverage [email or web address] Password [email or web address] 05/22/2013, 3:50 PM   LOS: 1 day

## 2013-05-22 NOTE — H&P (Signed)
[facility] History and Physical  [name] FUX:[id number] DOB: [birth year]    PCP:   Jacelyn Pi, MD   Chief Complaint: sharp chest pain, radiating to her right arm.  HPI: [name] is an 43 y.o. female with hx of morbid obesity, s/p bariatric surgery, hx of DM but now diet controlled only after losing over 100 lbs, hx of prior chest pain felt to be musculoskeletal (Dr Mare Ferrari), hx of hypothyrodism, s/p CCY, asthma, presents to the emergy room with several episodes of chest pain today.  She said they lasted from 15-30 minutes, some at rest, others with exertion, but she didn't think it was related to eating.  There were increase gas and burping.  She had slight nausea, but no shortness of breath, and she had no pleuritic CP.  She did have some diaphoresis earlier today, but didn't have chest pain with it.  Evalaution in the ER included negative initial cardiac markers, negative EKG, and normal Cr.  Her Hb was 10 grams per dL, and WBC was 14K.  Her CXR was clear except for mild bronchitic changes.  Hospitalist was asked to admit her for chest pain r/out.  Rewiew of Systems:  Constitutional: Negative for malaise, fever and chills. No significant weight gain Eyes: Negative for eye pain, redness and discharge, diplopia, visual changes, or flashes of light. ENMT: Negative for ear pain, hoarseness, nasal congestion, sinus pressure and sore throat. No headaches; tinnitus, drooling, or problem swallowing. Cardiovascular: Negative for palpitations, diaphoresis, dyspnea and peripheral edema. ; No orthopnea, PND Respiratory: Negative for cough, hemoptysis, wheezing and stridor. No pleuritic chestpain. Gastrointestinal: Negative for nausea, vomiting, diarrhea, constipation, abdominal pain, melena, blood in stool, hematemesis, jaundice and rectal bleeding.    Genitourinary: Negative for frequency, dysuria, incontinence,flank pain and hematuria; Musculoskeletal: Negative for back pain and neck  pain. Negative for swelling and trauma.;  Skin: . Negative for pruritus, rash, abrasions, bruising and skin lesion.; ulcerations Neuro: Negative for headache, lightheadedness and neck stiffness. Negative for weakness, altered level of consciousness , altered mental status, extremity weakness, burning feet, involuntary movement, seizure and syncope.  Psych: negative for anxiety, depression, insomnia, tearfulness, panic attacks, hallucinations, paranoia, suicidal or homicidal ideation    Past Medical History  Diagnosis Date  . Chest pain   . Thyroid disease     Past Surgical History  Procedure Laterality Date  . Galbladder    . Umbilical hernia repair    . Bariatric surgery    . Cholecystectomy      Medications:  HOME MEDS: Prior to Admission medications   Medication Sig Start Date End Date Taking? Authorizing Provider  acetaminophen (TYLENOL) 500 MG tablet Take 500 mg by mouth daily as needed for mild pain.   Yes Historical Provider, MD  Cholecalciferol (VITAMIN D-3 PO) Take 2,000 Int'l Units by mouth daily.    Yes Historical Provider, MD  levothyroxine (SYNTHROID, LEVOTHROID) 125 MCG tablet Take 125 mcg by mouth daily.     Yes Historical Provider, MD  Multiple Vitamin (MULTIVITAMIN) tablet Take 1 tablet by mouth daily.    Yes Historical Provider, MD  pantoprazole (PROTONIX) 40 MG tablet Take 40 mg by mouth 2 (two) times daily.   Yes Historical Provider, MD     Allergies:  No Known Allergies  Social History:   reports that she has never smoked. She does not have any smokeless tobacco history on file. She reports that she does not drink alcohol or use illicit drugs.  Family History: Family History  Problem  Relation Age of Onset  . Heart disease Father   . Hypertension Father   . Diabetes Father      Physical Exam: Filed Vitals:   05/21/13 1816  BP: 112/60  Pulse: 70  Temp: 98.6 F (37 C)  TempSrc: Oral  Resp: 20  Weight: 101.606 kg (224 lb)  SpO2: 96%   Blood  pressure 112/60, pulse 70, temperature 98.6 F (37 C), temperature source Oral, resp. rate 20, weight 101.606 kg (224 lb), last menstrual period 05/21/2013, SpO2 96.00%.  GEN:  Pleasant patient lying in the stretcher in no acute distress; cooperative with exam. PSYCH:  alert and oriented x4; does not appear anxious or depressed; affect is appropriate. HEENT: Mucous membranes pink and anicteric; PERRLA; EOM intact; no cervical lymphadenopathy nor thyromegaly or carotid bruit; no JVD; There were no stridor. Neck is very supple. Breasts:: Not examined CHEST WALL: No tenderness CHEST: Normal respiration, clear to auscultation bilaterally.  HEART: Regular rate and rhythm.  There are no murmur, rub, or gallops.   BACK: No kyphosis or scoliosis; no CVA tenderness ABDOMEN: soft and non-tender; no masses, no organomegaly, normal abdominal bowel sounds; no pannus; no intertriginous candida. There is no rebound and no distention. Rectal Exam: Not done EXTREMITIES: No bone or joint deformity; age-appropriate arthropathy of the hands and knees; no edema; no ulcerations.  There is no calf tenderness. Genitalia: not examined PULSES: 2+ and symmetric SKIN: Normal hydration no rash or ulceration CNS: Cranial nerves 2-12 grossly intact no focal lateralizing neurologic deficit.  Speech is fluent; uvula elevated with phonation, facial symmetry and tongue midline. DTR are normal bilaterally, cerebella exam is intact, barbinski is negative and strengths are equaled bilaterally.  No sensory loss.   Labs on Admission:  Basic Metabolic Panel:  Recent Labs Lab 05/21/13 1828  NA 138  K 3.9  CL 100  CO2 25  GLUCOSE 94  BUN 10  CREATININE 0.49*  CALCIUM 7.9*   Liver Function Tests: No results found for this basename: AST, ALT, ALKPHOS, BILITOT, PROT, ALBUMIN,  in the last 168 hours No results found for this basename: LIPASE, AMYLASE,  in the last 168 hours No results found for this basename: AMMONIA,  in the  last 168 hours CBC:  Recent Labs Lab 05/21/13 1828  WBC 14.3*  HGB 9.8*  HCT 32.9*  MCV 53.8*  PLT 339   Cardiac Enzymes: No results found for this basename: CKTOTAL, CKMB, CKMBINDEX, TROPONINI,  in the last 168 hours  CBG: No results found for this basename: GLUCAP,  in the last 168 hours   Radiological Exams on Admission: Dg Chest 2 View  05/21/2013   CLINICAL DATA:  Right-sided chest pain.  EXAM: CHEST  2 VIEW  COMPARISON:  12/02/2009.  FINDINGS: The cardiac silhouette, mediastinal and hilar contours are within normal limits and stable. Prominent hilar contours are unchanged. Mild chronic bronchitic type lung changes but no acute overlying pulmonary process. No pleural effusion. The bony thorax is intact.  IMPRESSION: Mild chronic bronchitic type lung changes but no acute pulmonary findings.   Electronically Signed   By: Kalman Jewels M.D.   On: 05/21/2013 20:05    EKG: Independently reviewed. NSR with no acute ST_T changes.   Assessment/Plan Present on Admission:  . Hypothyroid . Obesity hypoventilation syndrome . Obesity . GERD (gastroesophageal reflux disease) . Dyspepsia . Atypical chest pain  PLAN:  She does have some CRF, presents with atypical chest pain.  I don't think this is very suspicious for  ACS, and her history was vague as well.  Will cycle her troponin, start ASA, and obtain an ECHO.   If possible, she should have a stress test as well. For her GERD, will continue her PPI.  For her hypothyroidism, will continue her synthroid and check TSH.   She is otherwise stable, full code, and will be admitted to [facility] service.  Other plans as per orders.  Code Status:  FULL Haskel Trigueros, MD. [facility] Pager [phone] 7pm to 7am.  05/22/2013, 12:12 AM

## 2013-05-22 NOTE — Consult Note (Signed)
Patient ID: [name] MRN: [id number] DOB/AGE: Sep 22, 1970 43 y.o.  Admit date: 05/21/2013 Primary Cardiologist: Darlin Coco Reason for Consultation: Chest pain  HPI: 43 yo female with history of obesity, DM, prior gastric bypass surgery with significant weight loss, GERD, hypothyroidism who is admitted with chest pain. She describes several episodes of chest discomfort at rest and with exertion, not associated with eating, no associated SOB. The pain began while laying in bed yesterday morning. She was diaphoretic and felt numbness in her arms. The pain radiated across her chest into the right shoulder, lasting for 30 minutes. She went to the gym and had recurrence of severe chest discomfort at minimal exertional levels. She felt dizzy and stopped exercising. Recurrence of arm and chest pain in the evening so she came into the ED. One episode of chest pain last night. Troponin has been negative x 3. EKG without ischemic changes. D-dimer is within normal limits. Comfortable this am. No other complaints. She does have a history of GERD but this feels different than her GERD pain. She has had increased belching lately.    Past Medical History  Diagnosis Date  . Chest pain   . Thyroid disease   . GERD (gastroesophageal reflux disease)     Family History  Problem Relation Age of Onset  . Heart disease Father   . Hypertension Father   . Diabetes Father     History   Social History  . Marital Status: Married    Spouse Name: N/A    Number of Children: N/A  . Years of Education: N/A   Occupational History  . Not on file.   Social History Main Topics  . Smoking status: Never Smoker   . Smokeless tobacco: Not on file  . Alcohol Use: No  . Drug Use: No  . Sexual Activity: Not on file   Other Topics Concern  . Not on file   Social History Narrative  . No narrative on file    Past Surgical History  Procedure Laterality Date  . Galbladder    . Umbilical hernia repair     . Bariatric surgery    . Cholecystectomy      No Known Allergies  Prescriptions prior to admission  Medication Sig Dispense Refill  . acetaminophen (TYLENOL) 500 MG tablet Take 500 mg by mouth daily as needed for mild pain.      . Cholecalciferol (VITAMIN D-3 PO) Take 2,000 Int'l Units by mouth daily.       Marland Kitchen levothyroxine (SYNTHROID, LEVOTHROID) 125 MCG tablet Take 125 mcg by mouth daily.        . Multiple Vitamin (MULTIVITAMIN) tablet Take 1 tablet by mouth daily.       . pantoprazole (PROTONIX) 40 MG tablet Take 40 mg by mouth 2 (two) times daily.        Review of systems complete and found to be negative unless listed above    Physical Exam: Blood pressure 95/51, pulse 66, temperature 97.5 F (36.4 C), temperature source Oral, resp. rate 18, height 5\' 2"  (1.575 m), weight 223 lb 8.7 oz (101.4 kg), last menstrual period 05/21/2013, SpO2 96.00%.    General: Well developed, well nourished, NAD  HEENT: OP clear, mucus membranes moist  SKIN: warm, dry. No rashes.  Neuro: No focal deficits  Musculoskeletal: Muscle strength 5/5 all ext  Psychiatric: Mood and affect normal  Neck: No JVD, no carotid bruits, no thyromegaly, no lymphadenopathy.  Lungs:Clear bilaterally, no wheezes, rhonci, crackles  Cardiovascular: Regular rate and rhythm. No murmurs, gallops or rubs.  Abdomen:Soft. Bowel sounds present. Non-tender.  Extremities: No lower extremity edema. Pulses are 2 + in the bilateral DP/PT.   Labs:   Lab Results  Component Value Date   WBC 14.3* 05/21/2013   HGB 9.8* 05/21/2013   HCT 32.9* 05/21/2013   MCV 53.8* 05/21/2013   PLT 339 05/21/2013     Recent Labs Lab 05/21/13 1828  NA 138  K 3.9  CL 100  CO2 25  BUN 10  CREATININE 0.49*  CALCIUM 7.9*  GLUCOSE 94   Lab Results  Component Value Date   TROPONINI <0.30 05/22/2013      Chest x-ray 05/21/12: The cardiac silhouette, mediastinal and hilar contours are within  normal limits and stable. Prominent hilar  contours are unchanged.  Mild chronic bronchitic type lung changes but no acute overlying  pulmonary process. No pleural effusion. The bony thorax is intact.  IMPRESSION:  Mild chronic bronchitic type lung changes but no acute pulmonary  findings.  EKG: Sinus, no ST or T wave abnormalities to suggest ischemia.   ASSESSMENT AND PLAN:   1. Chest pain: Her pain has typical and atypical features. She had prior DM but is not currently requiring therapy. Her risk factors include FH of CAD, obesity. Will arrange exercise stress myoview today to exclude ischemia. (we have spoken to Nuclear Medicine and she will have stress test today). An echocardiogram has been ordered by the primary team. If her stress test does not show ischemia and her echocardiogram has no structural defects, normal LVEF then would discharge home later today. As above, this could be GI related with known history of dyspepsia. Continue PPI.   Signed: Lauree Chandler, MD 05/22/2013, 9:09 AM

## 2013-05-22 NOTE — Progress Notes (Signed)
UR completed 

## 2013-05-22 NOTE — Progress Notes (Cosign Needed)
Nuc normal. 2D echo still pending. I called echo dept to let them know patient is a possible discharge if echo is OK but they are reportedly backed up. It is not clear this will get done today (but they will try). I called our office and next available outpt echo is 1/28. Dr. Angelena Form prefers to get 2D echo as inpatient before clearing for DC thus will keep until this has resulted. Pt aware of result. Christyne Mccain PA-C

## 2013-05-23 DIAGNOSIS — E039 Hypothyroidism, unspecified: Secondary | ICD-10-CM

## 2013-05-23 DIAGNOSIS — I517 Cardiomegaly: Secondary | ICD-10-CM

## 2013-05-23 MED ORDER — FUROSEMIDE 20 MG PO TABS: 20.0000 mg | ORAL_TABLET | Freq: Every day | ORAL | Status: DC | PRN

## 2013-05-23 NOTE — Progress Notes (Signed)
    Subjective:  She has some numbness in her arms and hands, worse in the morning.  . docusate sodium  100 mg Oral BID  . enoxaparin (LOVENOX) injection  40 mg Subcutaneous Q24H  . multivitamin with minerals  1 tablet Oral Daily  . pantoprazole  40 mg Oral BID  . sodium chloride  3 mL Intravenous Q12H    Objective:  Vital Signs in the last 24 hours: Temp:  [97.9 F (36.6 C)-98.2 F (36.8 C)] 97.9 F (36.6 C) (01/13 0333) Pulse Rate:  [64-118] 68 (01/13 0333) Resp:  [18] 18 (01/13 0333) BP: (86-141)/(63-99) 103/63 mmHg (01/13 0333) SpO2:  [93 %-100 %] 97 % (01/13 0333)  Intake/Output from previous day:  Intake/Output Summary (Last 24 hours) at 05/23/13 0958 Last data filed at 05/22/13 2200  Gross per 24 hour  Intake    240 ml  Output      0 ml  Net    240 ml    Physical Exam: General appearance: alert, cooperative, no distress and moderately obese Lungs: clear to auscultation bilaterally Heart: regular rate and rhythm   Rate: 68  Rhythm: normal sinus rhythm  Lab Results:  Recent Labs  05/21/13 1828  WBC 14.3*  HGB 9.8*  PLT 339    Recent Labs  05/21/13 1828  NA 138  K 3.9  CL 100  CO2 25  GLUCOSE 94  BUN 10  CREATININE 0.49*    Recent Labs  05/22/13 0736 05/22/13 1307  TROPONINI <0.30 <0.30   No results found for this basename: INR,  in the last 72 hours  Imaging: Imaging results have been reviewed  Cardiac Studies:  Assessment/Plan:   Principal Problem:   Chest pain with low risk of acute coronary syndrome- Low risk Myoview  Active Problems:   Obesity hypoventilation syndrome-n C-pap till she had GBP surg   Hypothyroid   Obesity- s/p GBP surg   GERD (gastroesophageal reflux disease)  PLAN: Myoview negative, echo pending. Her arm numbness and discomfort sounds like carpal tunnel syndrome. Home later today if echo negative. PRN follow up.  Kerin Ransom PA-C Beeper [phone] 05/23/2013, 9:58 AM  The patient was seen, examined and  discussed with Kerin Ransom, PA-C and I agree with the above.  Chest pain: Her pain has mostly atypical features. Myoview was negative. Her risk factors include obesity, she is motivated to loose weight, she had GBS. Off therapy for DM.  We will check for LVEF and possible WMA on echo, if normal, discharge today.  This could be GI related with known history of dyspepsia. Continue PPI.    Ena Dawley, Lemmie Evens 05/23/2013

## 2013-05-23 NOTE — Progress Notes (Signed)
Echocardiogram 2D Echocardiogram has been performed.  Joelene Millin 05/23/2013, 9:33 AM

## 2013-05-23 NOTE — Discharge Summary (Signed)
PATIENT DETAILS Name: [name] Age: 43 y.o. Sex: female Date of Birth: 10/18/1970 MRN: [id number]. Admit Date: 05/21/2013 Admitting Physician: Orvan Falconer, MD [id number], MD  Recommendations for Outpatient Follow-up:  1. Patient has chronic bilateral hand tingling/numbness- further workup deferred to the outpatient setting  PRIMARY DISCHARGE DIAGNOSIS:  Principal Problem:   Chest pain with low risk of acute coronary syndrome- Low risk Myoview  Active Problems:   Obesity hypoventilation syndrome-n C-pap till she had GBP surg   Hypothyroid   Obesity- s/p GBP surg   GERD (gastroesophageal reflux disease)      PAST MEDICAL HISTORY: Past Medical History  Diagnosis Date  . Chest pain   . Thyroid disease   . GERD (gastroesophageal reflux disease)     DISCHARGE MEDICATIONS:   Medication List         acetaminophen 500 MG tablet  Commonly known as:  TYLENOL  Take 500 mg by mouth daily as needed for mild pain.     furosemide 20 MG tablet  Commonly known as:  LASIX  Take 1 tablet (20 mg total) by mouth daily as needed for fluid or edema.     levothyroxine 125 MCG tablet  Commonly known as:  SYNTHROID, LEVOTHROID  Take 125 mcg by mouth daily.     multivitamin tablet  Take 1 tablet by mouth daily.     pantoprazole 40 MG tablet  Commonly known as:  PROTONIX  Take 40 mg by mouth 2 (two) times daily.     VITAMIN D-3 PO  Take 2,000 Int'l Units by mouth daily.        ALLERGIES:  No Known Allergies  BRIEF HPI:  See H&P, Labs, Consult and Test reports for all details in brief, patient was admitted for evaluation of chest pain.  CONSULTATIONS:   cardiology  PERTINENT RADIOLOGIC STUDIES: Dg Chest 2 View  05/21/2013   CLINICAL DATA:  Right-sided chest pain.  EXAM: CHEST  2 VIEW  COMPARISON:  12/02/2009.  FINDINGS: The cardiac silhouette, mediastinal and hilar contours are within normal limits and stable. Prominent hilar contours are unchanged. Mild chronic  bronchitic type lung changes but no acute overlying pulmonary process. No pleural effusion. The bony thorax is intact.  IMPRESSION: Mild chronic bronchitic type lung changes but no acute pulmonary findings.   Electronically Signed   By: Kalman Jewels M.D.   On: 05/21/2013 20:05   Nm Myocar Multi W/spect W/wall Motion / Ef  05/22/2013   CLINICAL DATA:  43 year old with chest pain.  EXAM: MYOCARDIAL IMAGING WITH SPECT (REST AND PHARMACOLOGIC-STRESS)  GATED LEFT VENTRICULAR WALL MOTION STUDY  LEFT VENTRICULAR EJECTION FRACTION  TECHNIQUE: Standard myocardial SPECT imaging performed after resting intravenous injection of 10 mCi Tc-34m sestimibi. Subsequently, intravenous infusion of regadenoson performed under the supervision of the Cardiology staff. At peak effect of the drug, 30 mCi Tc-24m sestimibi injected intravenously and standard myocardial SPECT imaging performed. Quantitative gated imaging also performed to evaluate left ventricular wall motion and estimate left ventricular ejection fraction.  FINDINGS: MYOCARDIAL IMAGING WITH SPECT (REST AND PHARMACOLOGIC-STRESS)  The left ventricle myocardial perfusion is within normal limits. There is no significant fixed or reversible defect.  GATED LEFT VENTRICULAR WALL MOTION STUDY  Review of the gated images demonstrates normal wall motion.  LEFT VENTRICULAR EJECTION FRACTION  QGS ejection fraction measures 69% , with an end-diastolic volume of 82 ml and an end-systolic volume of 26 ml.  IMPRESSION: No evidence for pharmacological induced ischemia.  Calculated ejection fraction is 69% with  normal wall motion.   Electronically Signed   By: Markus Daft M.D.   On: 05/22/2013 14:17     PERTINENT LAB RESULTS: CBC:  Recent Labs  05/21/13 1828  WBC 14.3*  HGB 9.8*  HCT 32.9*  PLT 339   CMET CMP     Component Value Date/Time   NA 138 05/21/2013 1828   K 3.9 05/21/2013 1828   CL 100 05/21/2013 1828   CO2 25 05/21/2013 1828   GLUCOSE 94 05/21/2013 1828   BUN  10 05/21/2013 1828   CREATININE 0.49* 05/21/2013 1828   CALCIUM 7.9* 05/21/2013 1828   PROT 7.8 04/27/2006 0943   ALBUMIN 3.7 04/27/2006 0943   AST 16 04/27/2006 0943   ALT 22 04/27/2006 0943   ALKPHOS 70 04/27/2006 0943   BILITOT 0.3 04/27/2006 0943   GFRNONAA >90 05/21/2013 1828   GFRAA >90 05/21/2013 1828    GFR Estimated Creatinine Clearance: 101.1 ml/min (by C-G formula based on Cr of 0.49). No results found for this basename: LIPASE, AMYLASE,  in the last 72 hours  Recent Labs  05/22/13 0402 05/22/13 0736 05/22/13 1307  TROPONINI <0.30 <0.30 <0.30   No components found with this basename: POCBNP,   Recent Labs  05/22/13 0025  DDIMER 0.45   No results found for this basename: HGBA1C,  in the last 72 hours No results found for this basename: CHOL, HDL, LDLCALC, TRIG, CHOLHDL, LDLDIRECT,  in the last 72 hours  Recent Labs  05/22/13 0405  TSH 3.746   No results found for this basename: VITAMINB12, FOLATE, FERRITIN, TIBC, IRON, RETICCTPCT,  in the last 72 hours Coags: No results found for this basename: PT, INR,  in the last 72 hours Microbiology: No results found for this or any previous visit (from the past 240 hour(s)).   BRIEF HOSPITAL COURSE:  Chest pain - Patient was admitted to a telemetry unit, she was placed on aspirin, cardiac enzymes were cycled. Cardiac enzymes were thankfully negative. Cardiology was consulted, nuclear stress test was negative. An echocardiogram showed EF of 55-60 percent. - Patient is being discharged home in a stable manner. She no longer has chest pain. Suspect probable GERD to be the etiology of her chest pain. She is to continue with PPI.  GERD - Continue with PPI  Hx of DM  -previously was on medications-but after gastric bypass-not taking any meds  -follows with Dr Chalmers Cater as outpatient.    Hypothyroidism - Continue with levothyroxine   TODAY-DAY OF DISCHARGE:  Subjective:   [name] today has no headache,no chest  abdominal pain,no new weakness tingling or numbness, feels much better wants to go home today.   Objective:   Blood pressure 103/63, pulse 68, temperature 97.9 F (36.6 C), temperature source Oral, resp. rate 18, height 5\' 2"  (1.575 m), weight 101.4 kg (223 lb 8.7 oz), last menstrual period 05/21/2013, SpO2 97.00%.  Intake/Output Summary (Last 24 hours) at 05/23/13 1002 Last data filed at 05/22/13 2200  Gross per 24 hour  Intake    240 ml  Output      0 ml  Net    240 ml   Filed Weights   05/21/13 1816 05/22/13 0128  Weight: 101.606 kg (224 lb) 101.4 kg (223 lb 8.7 oz)    Exam Awake Alert, Oriented *3, No new F.N deficits, Normal affect [place].AT,PERRAL Supple Neck,No JVD, No cervical lymphadenopathy appriciated.  Symmetrical Chest wall movement, Good air movement bilaterally, CTAB RRR,No Gallops,Rubs or new Murmurs, No Parasternal Heave +  ve B.Sounds, Abd Soft, Non tender, No organomegaly appriciated, No rebound -guarding or rigidity. No Cyanosis, Clubbing or edema, No new Rash or bruise  DISCHARGE CONDITION: Stable  DISPOSITION: Home  DISCHARGE INSTRUCTIONS:    Activity:  As tolerated   Diet recommendation: Diabetic Diet Heart Healthy diet      Discharge Orders   Future Orders Complete By Expires   Call MD for:  severe uncontrolled pain  As directed    Diet - low sodium heart healthy  As directed    Increase activity slowly  As directed       Follow-up Information   Schedule an appointment as soon as possible for a visit with Jacelyn Pi, MD. (As needed)    Specialty:  Endocrinology   Contact information:   [address] [place] [address] [phone]       Total Time spent on discharge equals 45 minutes.  SignedOren Binet 05/23/2013 10:02 AM

## 2013-09-28 ENCOUNTER — Other Ambulatory Visit: Payer: Self-pay | Admitting: Cardiology

## 2013-09-28 DIAGNOSIS — K219 Gastro-esophageal reflux disease without esophagitis: Secondary | ICD-10-CM

## 2013-09-28 NOTE — Telephone Encounter (Signed)
Should this be denied? Patient is prn follow up but it has been over 2 yrs since she has been here. Please advise. Thanks, MI

## 2014-01-10 ENCOUNTER — Ambulatory Visit: Payer: Self-pay | Admitting: Family Medicine

## 2014-06-28 ENCOUNTER — Encounter: Payer: Self-pay | Admitting: Cardiology

## 2014-06-28 ENCOUNTER — Ambulatory Visit (INDEPENDENT_AMBULATORY_CARE_PROVIDER_SITE_OTHER): Payer: Self-pay | Admitting: Cardiology

## 2014-06-28 VITALS — BP 110/60 | HR 64 | Ht 61.0 in | Wt 215.1 lb

## 2014-06-28 DIAGNOSIS — F32A Depression, unspecified: Secondary | ICD-10-CM

## 2014-06-28 DIAGNOSIS — D563 Thalassemia minor: Secondary | ICD-10-CM

## 2014-06-28 DIAGNOSIS — E039 Hypothyroidism, unspecified: Secondary | ICD-10-CM

## 2014-06-28 DIAGNOSIS — F329 Major depressive disorder, single episode, unspecified: Secondary | ICD-10-CM

## 2014-06-28 DIAGNOSIS — R079 Chest pain, unspecified: Secondary | ICD-10-CM

## 2014-06-28 LAB — LIPID PANEL
Cholesterol: 123 mg/dL (ref 0–200)
HDL: 37.1 mg/dL — AB (ref >39.00)
LDL CALC: 70 mg/dL (ref 0–99)
NonHDL: 85.9
TRIGLYCERIDES: 82 mg/dL (ref 0.0–149.0)
Total CHOL/HDL Ratio: 3
VLDL: 16.4 mg/dL (ref 0.0–40.0)

## 2014-06-28 LAB — CBC WITH DIFFERENTIAL/PLATELET
BASOS PCT: 0.2 % (ref 0.0–3.0)
Basophils Absolute: 0 10*3/uL (ref 0.0–0.1)
Eosinophils Absolute: 0.2 10*3/uL (ref 0.0–0.7)
Eosinophils Relative: 1.7 % (ref 0.0–5.0)
HCT: 27.3 % — ABNORMAL LOW (ref 36.0–46.0)
LYMPHS PCT: 31.9 % (ref 12.0–46.0)
Lymphs Abs: 3.7 10*3/uL (ref 0.7–4.0)
MCHC: 28.8 g/dL — ABNORMAL LOW (ref 30.0–36.0)
MCV: 50 fl — ABNORMAL LOW (ref 78.0–100.0)
MONOS PCT: 4.5 % (ref 3.0–12.0)
Monocytes Absolute: 0.5 10*3/uL (ref 0.1–1.0)
NEUTROS ABS: 7.2 10*3/uL (ref 1.4–7.7)
Neutrophils Relative %: 61.7 % (ref 43.0–77.0)
Platelets: 216 10*3/uL (ref 150.0–400.0)
RBC: 5.46 Mil/uL — ABNORMAL HIGH (ref 3.87–5.11)
RDW: 22.8 % — AB (ref 11.5–15.5)
WBC: 11.7 10*3/uL — ABNORMAL HIGH (ref 4.0–10.5)

## 2014-06-28 LAB — HEPATIC FUNCTION PANEL
ALK PHOS: 89 U/L (ref 39–117)
ALT: 11 U/L (ref 0–35)
AST: 15 U/L (ref 0–37)
Albumin: 3.5 g/dL (ref 3.5–5.2)
BILIRUBIN DIRECT: 0.1 mg/dL (ref 0.0–0.3)
BILIRUBIN TOTAL: 0.3 mg/dL (ref 0.2–1.2)
Total Protein: 7.4 g/dL (ref 6.0–8.3)

## 2014-06-28 LAB — TSH: TSH: 3.13 u[IU]/mL (ref 0.35–4.50)

## 2014-06-28 LAB — T4, FREE: Free T4: 0.66 ng/dL (ref 0.60–1.60)

## 2014-06-28 LAB — BASIC METABOLIC PANEL
BUN: 7 mg/dL (ref 6–23)
CO2: 30 mEq/L (ref 19–32)
Calcium: 8.3 mg/dL — ABNORMAL LOW (ref 8.4–10.5)
Chloride: 105 mEq/L (ref 96–112)
Creatinine, Ser: 0.47 mg/dL (ref 0.40–1.20)
GFR: 152.85 mL/min (ref >60.00)
Glucose, Bld: 91 mg/dL (ref 70–99)
Potassium: 3.5 mEq/L (ref 3.5–5.1)
SODIUM: 137 meq/L (ref 135–145)

## 2014-06-28 MED ORDER — FUROSEMIDE 20 MG PO TABS: 20.0000 mg | ORAL_TABLET | Freq: Every day | ORAL | Status: DC | PRN

## 2014-06-28 MED ORDER — NITROGLYCERIN 0.4 MG SL SUBL: 0.4000 mg | SUBLINGUAL_TABLET | SUBLINGUAL | Status: DC | PRN

## 2014-06-28 MED ORDER — SERTRALINE HCL 25 MG PO TABS: 25.0000 mg | ORAL_TABLET | Freq: Every day | ORAL | Status: DC

## 2014-06-28 NOTE — Progress Notes (Signed)
Cardiology Office Note   Date:  06/28/2014   ID:  [name], DOB [birth year], MRN [id number]  PCP:  Eulas Post, MD  Cardiologist:   Darlin Coco, MD   No chief complaint on file.     History of Present Illness: [name] is a 44 y.o. female who presents for office visit.  We last saw her in 2012  This pleasant 44 year old woman is seen for a  office visit. We see several of her family members. The patient is concerned about her heart. She has an interesting past history. At one time she weighed almost on 370 pounds. She underwent a gastric bypass at [facility] and her weight is down approximately 200 pounds from its peak.  The patient was a diabetic prior to her gastric bypass. When she had the gastric bypass her blood sugars normalized almost immediately. The patient has a history of hypothyroidism and is on Synthroid. The patient recently has had some chest discomfort and left arm discomfort which occurred after an argument with her sister.  The patient does not have any history of ischemic heart disease.  She was admitted in January 2015 and had a Myoview stress test which was normal and showed no ischemia and her ejection fraction was 69%.  The Myoview was on 05/22/13.  Echocardiogram on 05/23/13 showed normal left ventricular systolic and normal diastolic function and no wall motion abnormalities.  No valve abnormalities.  Past Medical History  Diagnosis Date  . Chest pain   . Thyroid disease   . GERD (gastroesophageal reflux disease)     Past Surgical History  Procedure Laterality Date  . Galbladder    . Umbilical hernia repair    . Bariatric surgery    . Cholecystectomy       Current Outpatient Prescriptions  Medication Sig Dispense Refill  . acetaminophen (TYLENOL) 500 MG tablet Take 500 mg by mouth daily as needed for mild pain.    . Cholecalciferol (VITAMIN D-3 PO) Take 2,000 Int'l Units by mouth daily.     . furosemide (LASIX) 20 MG  tablet Take 1 tablet (20 mg total) by mouth daily as needed for fluid or edema. 90 tablet 3  . levothyroxine (SYNTHROID, LEVOTHROID) 125 MCG tablet Take 125 mcg by mouth daily.      . Multiple Vitamin (MULTIVITAMIN) tablet Take 1 tablet by mouth daily.     . pantoprazole (PROTONIX) 40 MG tablet TAKE ONE TABLET BY MOUTH TWICE DAILY 180 tablet 0  . nitroGLYCERIN (NITROSTAT) 0.4 MG SL tablet Place 1 tablet (0.4 mg total) under the tongue every 5 (five) minutes as needed for chest pain. 25 tablet PRN  . sertraline (ZOLOFT) 25 MG tablet Take 1 tablet (25 mg total) by mouth at bedtime. 90 tablet 3   No current facility-administered medications for this visit.    Allergies:   Review of patient's allergies indicates no known allergies.    Social History:  The patient  reports that she has never smoked. She does not have any smokeless tobacco history on file. She reports that she does not drink alcohol or use illicit drugs.   Family History:  The patient's family history includes Diabetes in her father; Heart disease in her father; Hypertension in her father.    ROS:  Please see the history of present illness.   Otherwise, review of systems are positive for none.   All other systems are reviewed and negative.    PHYSICAL EXAM: VS:  BP  110/60 mmHg  Pulse 64  Ht 5\' 1"  (1.549 m)  Wt 215 lb 1.9 oz (97.578 kg)  BMI 40.67 kg/m2 , BMI Body mass index is 40.67 kg/(m^2). GEN: Well nourished, well developed, in no acute distress HEENT: normal Neck: no JVD, carotid bruits, or masses Cardiac: Regular sinus rhythm.; no murmurs, rubs, or gallops, mild edema Respiratory:  clear to auscultation bilaterally, normal work of breathing GI: soft, nontender, nondistended, + BS MS: no deformity or atrophy Skin: warm and dry, no rash Neuro:  Strength and sensation are intact Psych: euthymic mood, full affect   EKG:  EKG is ordered today. The ekg ordered today demonstrates normal sinus rhythm.  No ischemic  changes.   Recent Labs: No results found for requested labs within last 365 days.    Lipid Panel No results found for: CHOL, TRIG, HDL, CHOLHDL, VLDL, LDLCALC, LDLDIRECT    Wt Readings from Last 3 Encounters:  06/28/14 215 lb 1.9 oz (97.578 kg)  05/22/13 223 lb 8.7 oz (101.4 kg)  04/15/11 163 lb (73.936 kg)        ASSESSMENT AND PLAN:  1.  Intermittent substernal chest pain brought on by emotional stress.  History of normal Myoview stress test one year ago. 2.  Hypothyroidism 3.  Morbid obesity, with past history of gastric sleeve procedure. 4.  Mild depression 5.  Thalassemia trait with chronic anemia  Disposition: We are refilling her furosemide.  She has been having mild fluid retention she with weight gain.  She has used her father's sublingual nitroglycerin successfully.  We will give her a prescription for herself.  She may be having some coronary spasm from time to time. We will check her thyroid function and then refill her generic Synthroid. For her depression she will try generic Zoloft 25 mg at bedtime    Current medicines are reviewed at length with the patient today.  The patient does not have concerns regarding medicines.  The following changes have been made:  no change  Labs/ tests ordered today include: CBC, lipid panel, and basal metabolic panel, hepatic function panel, free T4, and TSH.  The patient does have a past history of thalassemia trait  Orders Placed This Encounter  Procedures  . Lipid panel  . TSH  . Hepatic function panel  . Basic metabolic panel  . CBC with Differential/Platelet  . T4, free  . EKG 12-Lead     Disposition:   FU with Dr. Mare Ferrari  in 6 months   Signed, Darlin Coco, MD  06/28/2014 1:42 PM    [facility] [address], [place], [place]  [address] Phone: [phone]; Fax: [phone]

## 2014-06-28 NOTE — Patient Instructions (Addendum)
Will obtain labs today and call you with the results (cbc/lp/hfp/bmet/ft4/tsh)  START ZOLOFT 25 MG ONE AT BEDTIME   Use your NTG under your tongue for recurrent chest pain. May take one tablet every 5 minutes. If you are still having discomfort after 3 tablets in 15 minutes, call 911.  Your physician wants you to follow-up in:  [place] will receive a reminder letter in the mail two months in advance. If you don't receive a letter, please call our office to schedule the follow-up appointment.

## 2014-07-05 ENCOUNTER — Telehealth: Payer: Self-pay | Admitting: *Deleted

## 2014-07-05 DIAGNOSIS — D649 Anemia, unspecified: Secondary | ICD-10-CM

## 2014-07-05 MED ORDER — LEVOTHYROXINE SODIUM 125 MCG PO TABS: 125.0000 ug | ORAL_TABLET | Freq: Every day | ORAL | Status: DC

## 2014-07-05 NOTE — Telephone Encounter (Signed)
-----   Message from Darlin Coco, MD sent at 06/29/2014  7:57 AM EST ----- Please report.  The cholesterol is good.  The thyroid levels are good.  Continue current dose of generic Synthroid and send in a prescription to her pharmacy for this. She is anemic from her thalassemia trait.  Continue multivitamin with iron.  We should recheck her CBC at her next office visit.  The liver tests are good.

## 2014-07-05 NOTE — Telephone Encounter (Signed)
Advised patient of lab results  Patient not taking multivitamin with iron, will start

## 2014-08-06 ENCOUNTER — Emergency Department (INDEPENDENT_AMBULATORY_CARE_PROVIDER_SITE_OTHER)
Admission: EM | Admit: 2014-08-06 | Discharge: 2014-08-06 | Disposition: A | Discharge status: Home / Self Care | Payer: Self-pay | Source: Home / Self Care | Attending: Emergency Medicine | Admitting: Emergency Medicine

## 2014-08-06 ENCOUNTER — Emergency Department (INDEPENDENT_AMBULATORY_CARE_PROVIDER_SITE_OTHER): Payer: Self-pay

## 2014-08-06 ENCOUNTER — Encounter (HOSPITAL_COMMUNITY): Payer: Self-pay | Admitting: Emergency Medicine

## 2014-08-06 DIAGNOSIS — S90454A Superficial foreign body, right lesser toe(s), initial encounter: Secondary | ICD-10-CM

## 2014-08-06 DIAGNOSIS — B070 Plantar wart: Secondary | ICD-10-CM

## 2014-08-06 MED ORDER — TRAMADOL HCL 50 MG PO TABS: 50.0000 mg | ORAL_TABLET | Freq: Four times a day (QID) | ORAL | Status: DC | PRN

## 2014-08-06 NOTE — Discharge Instructions (Signed)
You have a foreign body in your toe. Please call [facility] for an appointment.  You have a wart on your 3rd toe. Get an over the counter wart removal kit.  You will likely have to use the kit several times.  Follow-up with [facility].

## 2014-08-06 NOTE — ED Notes (Signed)
Pt states that she has a knot on her toe that has been there for 2 years and a place on the great toe that has been there for over 1 year.

## 2014-08-06 NOTE — ED Provider Notes (Signed)
CSN: [id number]     Arrival date & time 08/06/14  1753 History   First MD Initiated Contact with Patient 08/06/14 1938     Chief Complaint  Patient presents with  . Foot Problem   (Consider location/radiation/quality/duration/timing/severity/associated sxs/prior Treatment) HPI She is a 44 year old woman here for evaluation of right foot pain. She states a year ago she had an injury to her right great toe where she slid into some glass. She thinks she has a glass remaining in the toe as there is a very sensitive spot.  She also reports pain at the base of her middle toe on the right foot due to a nodule.  Past Medical History  Diagnosis Date  . Chest pain   . Thyroid disease   . GERD (gastroesophageal reflux disease)    Past Surgical History  Procedure Laterality Date  . Galbladder    . Umbilical hernia repair    . Bariatric surgery    . Cholecystectomy     Family History  Problem Relation Age of Onset  . Heart disease Father   . Hypertension Father   . Diabetes Father    History  Substance Use Topics  . Smoking status: Never Smoker   . Smokeless tobacco: Not on file  . Alcohol Use: No   OB History    No data available     Review of Systems As in history of present illness Allergies  Review of patient's allergies indicates no known allergies.  Home Medications   Prior to Admission medications   Medication Sig Start Date End Date Taking? Authorizing Provider  acetaminophen (TYLENOL) 500 MG tablet Take 500 mg by mouth daily as needed for mild pain.    Historical Provider, MD  Cholecalciferol (VITAMIN D-3 PO) Take 2,000 Int'l Units by mouth daily.     Historical Provider, MD  furosemide (LASIX) 20 MG tablet Take 1 tablet (20 mg total) by mouth daily as needed for fluid or edema. 06/28/14   Darlin Coco, MD  levothyroxine (SYNTHROID, LEVOTHROID) 125 MCG tablet Take 1 tablet (125 mcg total) by mouth daily. 07/05/14   Darlin Coco, MD  Multiple Vitamins-Iron (ONE  DAILY MULTIVITAMIN/IRON PO) Take by mouth daily.    Historical Provider, MD  nitroGLYCERIN (NITROSTAT) 0.4 MG SL tablet Place 1 tablet (0.4 mg total) under the tongue every 5 (five) minutes as needed for chest pain. 06/28/14   Darlin Coco, MD  pantoprazole (PROTONIX) 40 MG tablet TAKE ONE TABLET BY MOUTH TWICE DAILY    Darlin Coco, MD  sertraline (ZOLOFT) 25 MG tablet Take 1 tablet (25 mg total) by mouth at bedtime. 06/28/14   Darlin Coco, MD  traMADol (ULTRAM) 50 MG tablet Take 1 tablet (50 mg total) by mouth every 6 (six) hours as needed. 08/06/14   Melony Overly, MD   BP 107/70 mmHg  Pulse 64  Temp(Src) 99.6 F (37.6 C) (Oral)  Resp 19  SpO2 100%  LMP 07/30/2014 Physical Exam  Constitutional: She is oriented to person, place, and time. She appears well-developed and well-nourished. No distress.  Cardiovascular: Normal rate.   Pulmonary/Chest: Effort normal.  Musculoskeletal:  Right foot: Medial plantar aspect of great toe is sensitive with an underlying nodule. She has a plantar wart on the plantar aspect of the third toe.  Neurological: She is alert and oriented to person, place, and time.    ED Course  Procedures (including critical care time) Labs Review Labs Reviewed - No data to display  Imaging Review  No results found.   MDM   1. Superficial foreign body toe without major open wound, no infection, right, initial encounter   2. Plantar wart of right foot    Final x-ray read pending at time of discharge. However, there is clearly a foreign body in the great toe. It appears to at least abut the distal phalanx. Discussed over-the-counter wart removal kit with the patient. Phone number for [facility] [place] [facility] provided for removal of foreign body. A few tramadol were prescribed to use as needed for pain.    Melony Overly, MD 08/06/14 [phone]

## 2014-10-02 ENCOUNTER — Emergency Department (INDEPENDENT_AMBULATORY_CARE_PROVIDER_SITE_OTHER)
Admission: EM | Admit: 2014-10-02 | Discharge: 2014-10-02 | Disposition: A | Discharge status: Home / Self Care | Payer: Self-pay | Source: Home / Self Care | Attending: Emergency Medicine | Admitting: Emergency Medicine

## 2014-10-02 ENCOUNTER — Encounter (HOSPITAL_COMMUNITY): Payer: Self-pay | Admitting: Emergency Medicine

## 2014-10-02 DIAGNOSIS — J069 Acute upper respiratory infection, unspecified: Secondary | ICD-10-CM

## 2014-10-02 MED ORDER — CETIRIZINE HCL 10 MG PO TABS: 10.0000 mg | ORAL_TABLET | Freq: Every day | ORAL | Status: DC

## 2014-10-02 MED ORDER — FLUTICASONE PROPIONATE 50 MCG/ACT NA SUSP: 2.0000 | Freq: Every day | NASAL | Status: DC

## 2014-10-02 MED ORDER — IPRATROPIUM BROMIDE 0.06 % NA SOLN: 2.0000 | Freq: Four times a day (QID) | NASAL | Status: DC

## 2014-10-02 NOTE — Discharge Instructions (Signed)
You have a cold virus. Take cetirizine daily for the next week. Use Flonase daily for the next week. Use Atrovent 4 times a day for the next week. These medications will help clear up your sinuses, which will help with your other symptoms. Warm soups will also help with your sinuses. You can use a humidifier to help you sleep more comfortably at night. You should start to feel better in 3-4 days. If you develop fevers, worsening cough, or having a hard time catching your breath, please go to the emergency room.

## 2014-10-02 NOTE — ED Provider Notes (Addendum)
CSN: [id number]     Arrival date & time 10/02/14  1821 History   None    Chief Complaint  Patient presents with  . URI   (Consider location/radiation/quality/duration/timing/severity/associated sxs/prior Treatment) HPI  She is a 44 year old woman here for evaluation of nasal congestion. Her symptoms started 2 days ago with mild nasal congestion and sneezing. Her symptoms have gradually gotten worse. She reports significant nasal congestion as well as rhinorrhea. She also reports sinus pressure. She has some sneezing and a mild cough. She reports some chest congestion and a mild sore throat. She denies any ear pain, nausea, vomiting. No fevers or chills.  Past Medical History  Diagnosis Date  . Chest pain   . Thyroid disease   . GERD (gastroesophageal reflux disease)    Past Surgical History  Procedure Laterality Date  . Galbladder    . Umbilical hernia repair    . Bariatric surgery    . Cholecystectomy     Family History  Problem Relation Age of Onset  . Heart disease Father   . Hypertension Father   . Diabetes Father    History  Substance Use Topics  . Smoking status: Never Smoker   . Smokeless tobacco: Not on file  . Alcohol Use: No   OB History    No data available     Review of Systems As in history of present illness Allergies  Review of patient's allergies indicates no known allergies.  Home Medications   Prior to Admission medications   Medication Sig Start Date End Date Taking? Authorizing Provider  acetaminophen (TYLENOL) 500 MG tablet Take 500 mg by mouth daily as needed for mild pain.    Historical Provider, MD  cetirizine (ZYRTEC) 10 MG tablet Take 1 tablet (10 mg total) by mouth daily. 10/02/14   Melony Overly, MD  Cholecalciferol (VITAMIN D-3 PO) Take 2,000 Int'l Units by mouth daily.     Historical Provider, MD  fluticasone (FLONASE) 50 MCG/ACT nasal spray Place 2 sprays into both nostrils daily. 10/02/14   Melony Overly, MD  furosemide (LASIX) 20 MG  tablet Take 1 tablet (20 mg total) by mouth daily as needed for fluid or edema. 06/28/14   Darlin Coco, MD  ipratropium (ATROVENT) 0.06 % nasal spray Place 2 sprays into both nostrils 4 (four) times daily. 10/02/14   Melony Overly, MD  levothyroxine (SYNTHROID, LEVOTHROID) 125 MCG tablet Take 1 tablet (125 mcg total) by mouth daily. 07/05/14   Darlin Coco, MD  Multiple Vitamins-Iron (ONE DAILY MULTIVITAMIN/IRON PO) Take by mouth daily.    Historical Provider, MD  nitroGLYCERIN (NITROSTAT) 0.4 MG SL tablet Place 1 tablet (0.4 mg total) under the tongue every 5 (five) minutes as needed for chest pain. 06/28/14   Darlin Coco, MD  pantoprazole (PROTONIX) 40 MG tablet TAKE ONE TABLET BY MOUTH TWICE DAILY    Darlin Coco, MD  sertraline (ZOLOFT) 25 MG tablet Take 1 tablet (25 mg total) by mouth at bedtime. Patient not taking: Reported on 10/02/2014 06/28/14   Darlin Coco, MD  traMADol (ULTRAM) 50 MG tablet Take 1 tablet (50 mg total) by mouth every 6 (six) hours as needed. 08/06/14   Melony Overly, MD   BP 135/77 mmHg  Pulse 87  Temp(Src) 99.7 F (37.6 C) (Oral)  Resp 18  SpO2 99%  LMP 09/25/2014 Physical Exam  Constitutional: She is oriented to person, place, and time. She appears well-developed and well-nourished. No distress.  HENT:  Head: Normocephalic and  atraumatic.  Right Ear: Tympanic membrane and external ear normal.  Left Ear: Tympanic membrane and external ear normal.  Nose: Mucosal edema and rhinorrhea present.  Mouth/Throat: Oropharynx is clear and moist. No oropharyngeal exudate.  Neck: Neck supple.  Cardiovascular: Regular rhythm and normal heart sounds.   No murmur heard. Pulmonary/Chest: Effort normal and breath sounds normal. No respiratory distress. She has no wheezes. She has no rales.  Lymphadenopathy:    She has no cervical adenopathy.  Neurological: She is alert and oriented to person, place, and time.    ED Course  Procedures (including critical  care time) Labs Review Labs Reviewed - No data to display  Imaging Review No results found.   MDM   1. Viral URI    Symptomatic treatment with Zyrtec, Flonase, Atrovent nasal spray. Expect improvement in 3-4 days. Return precautions reviewed.    Melony Overly, MD 10/02/14 2015  Melony Overly, MD 10/02/14 2022

## 2014-11-05 ENCOUNTER — Other Ambulatory Visit: Payer: Self-pay

## 2014-11-20 ENCOUNTER — Ambulatory Visit: Payer: Self-pay | Admitting: Cardiology

## 2014-12-19 ENCOUNTER — Other Ambulatory Visit: Payer: Self-pay | Admitting: Cardiology

## 2014-12-21 ENCOUNTER — Other Ambulatory Visit: Payer: Self-pay | Admitting: *Deleted

## 2014-12-21 DIAGNOSIS — K219 Gastro-esophageal reflux disease without esophagitis: Secondary | ICD-10-CM

## 2014-12-21 MED ORDER — PANTOPRAZOLE SODIUM 40 MG PO TBEC: 40.0000 mg | DELAYED_RELEASE_TABLET | Freq: Two times a day (BID) | ORAL | Status: DC

## 2014-12-23 ENCOUNTER — Encounter: Payer: Self-pay | Admitting: Cardiology

## 2014-12-31 ENCOUNTER — Encounter: Payer: Self-pay | Admitting: Cardiology

## 2015-01-08 ENCOUNTER — Ambulatory Visit (INDEPENDENT_AMBULATORY_CARE_PROVIDER_SITE_OTHER): Payer: Self-pay | Admitting: Physician Assistant

## 2015-01-08 ENCOUNTER — Encounter: Payer: Self-pay | Admitting: Physician Assistant

## 2015-01-08 VITALS — BP 100/70 | HR 68 | Ht 61.0 in | Wt 215.0 lb

## 2015-01-08 DIAGNOSIS — R079 Chest pain, unspecified: Secondary | ICD-10-CM

## 2015-01-08 DIAGNOSIS — E669 Obesity, unspecified: Secondary | ICD-10-CM

## 2015-01-08 MED ORDER — NITROGLYCERIN 0.4 MG SL SUBL: 0.4000 mg | SUBLINGUAL_TABLET | SUBLINGUAL | Status: DC | PRN

## 2015-01-08 NOTE — Assessment & Plan Note (Signed)
Weight loss program and exercise recommended.

## 2015-01-08 NOTE — Assessment & Plan Note (Signed)
Patient has occasional chest pain that's relieved with nitroglycerin. Mostly it's related to stress and anxiety. She's had negative stress Myoview in January 2015. She does have a family history of CAD. Recommend continue when necessary nitroglycerin. She is to call she has an increase in symptoms.

## 2015-01-08 NOTE — Patient Instructions (Signed)
Medication Instructions:  Your physician recommends that you continue on your current medications as directed. Please refer to the Current Medication list given to you today.  Labwork: NONE  Testing/Procedures: NONE  Follow-Up: Your physician wants you to follow-up in: 6 months with Dr. Mare Ferrari. You will receive a reminder letter in the mail two months in advance. If you don't receive a letter, please call our office to schedule the follow-up appointment.   Any Other Special Instructions Will Be Listed Below (If Applicable).

## 2015-01-08 NOTE — Progress Notes (Signed)
Cardiology Office Note   Date:  01/08/2015   ID:  [name], DOB 1971/05/03, MRN [id number]  PCP: Dr. Carolann Littler Cardiologist:  Dr. Mare Ferrari  Chief Complaint: Chest pain    History of Present Illness: [name] is a 44 y.o. female who presents for follow-up. She has history of gastric bypass at [facility] loss 200 pounds. She was a diabetic prior to this but her blood sugars normalized. She also has a history of chest pain and left arm discomfort that occur with stress. She was admitted in January 2015 and had a Myoview stress test that was normal and showed no ischemia EF 69%. Echo showed normal systolic function normal diastolic function no valvular abnormalities. She also has history of hypothyroidism. She was given sublingual nitroglycerin to take when necessary for possible coronary spasm. She also takes furosemide for mild fluid retention.  Patient comes in today for routine follow-up. She is only used 2 nitroglycerin since her last office visit. One time her sister was driving and they got in an argument and she developed chest pain and shortness of breath. Nitroglycerin relieved it. She is walking about 30 minutes a day without symptoms. She continues to gain weight and is seeing her doctor tomorrow for this. She also complains of bilateral hand numbness that is with her all the time. She says she's texting all the time and wonders if this is causing it. She occasionally gets leg edema and takes Lasix when necessary for this. She went to [employer] and was walking all day and eating salty foods. This caused increased edema relieved with Lasix.    Past Medical History  Diagnosis Date  . Chest pain   . Thyroid disease   . GERD (gastroesophageal reflux disease)     Past Surgical History  Procedure Laterality Date  . Galbladder    . Umbilical hernia repair    . Bariatric surgery    . Cholecystectomy       Current Outpatient Prescriptions  Medication Sig  Dispense Refill  . acetaminophen (TYLENOL) 500 MG tablet Take 500 mg by mouth daily as needed for mild pain.    . Cholecalciferol (VITAMIN D-3 PO) Take 2,000 Int'l Units by mouth daily.     . furosemide (LASIX) 20 MG tablet Take 1 tablet (20 mg total) by mouth daily as needed for fluid or edema. 90 tablet 3  . levothyroxine (SYNTHROID, LEVOTHROID) 125 MCG tablet Take 1 tablet (125 mcg total) by mouth daily. 90 tablet 3  . Multiple Vitamins-Iron (ONE DAILY MULTIVITAMIN/IRON PO) Take by mouth daily.    . nitroGLYCERIN (NITROSTAT) 0.4 MG SL tablet Place 1 tablet (0.4 mg total) under the tongue every 5 (five) minutes as needed for chest pain. 25 tablet PRN  . pantoprazole (PROTONIX) 40 MG tablet Take 1 tablet (40 mg total) by mouth 2 (two) times daily. 180 tablet 1   No current facility-administered medications for this visit.    Allergies:   Review of patient's allergies indicates no known allergies.    Social History:  The patient  reports that she has never smoked. She does not have any smokeless tobacco history on file. She reports that she does not drink alcohol or use illicit drugs.   Family History:  The patient's    family history includes Diabetes in her father; Heart attack in her mother; Heart disease in her father; Hypertension in her father. There is no history of Stroke.    ROS:  Please see the history  of present illness.   Otherwise, review of systems are positive for headaches.   All other systems are reviewed and negative.    PHYSICAL EXAM: VS:  BP 100/70 mmHg  Pulse 68  Ht 5\' 1"  (1.549 m)  Wt 215 lb (97.523 kg)  BMI 40.64 kg/m2 , BMI Body mass index is 40.64 kg/(m^2). GEN: Obese, in no acute distress Neck: no JVD, HJR, carotid bruits, or masses Cardiac:  RRR; no murmurs,gallop, rubs, thrill or heave,  Respiratory:  clear to auscultation bilaterally, normal work of breathing GI: soft, nontender, nondistended, + BS MS: no deformity or atrophy Extremities: without  cyanosis, clubbing, edema, good distal pulses bilaterally.  Skin: warm and dry, no rash Neuro:  Strength and sensation are intact    EKG:  EKG is not ordered today.    Recent Labs: 06/28/2014: ALT 11; BUN 7; Creatinine, Ser 0.47; Hemoglobin 7.9 cL*; Platelets 216.0; Potassium 3.5; Sodium 137; TSH 3.13    Lipid Panel    Component Value Date/Time   CHOL 123 06/28/2014 1223   TRIG 82.0 06/28/2014 1223   HDL 37.10* 06/28/2014 1223   CHOLHDL 3 06/28/2014 1223   VLDL 16.4 06/28/2014 1223   LDLCALC 70 06/28/2014 1223      Wt Readings from Last 3 Encounters:  01/08/15 215 lb (97.523 kg)  06/28/14 215 lb 1.9 oz (97.578 kg)  05/22/13 223 lb 8.7 oz (101.4 kg)      Other studies Reviewed: Additional studies/ records that were reviewed today include and review of the records demonstrates: 2-D echo 05/23/13  Study Conclusions  - Left ventricle: The cavity size was normal. Wall thickness   was normal. Systolic function was normal. The estimated   ejection fraction was in the range of 55% to 60%. Wall   motion was normal; there were no regional wall motion   abnormalities. Left ventricular diastolic function   parameters were normal. - Left atrium: The atrium was mildly dilated.  Stress Myoview 05/22/13  Impressions:  LEFT VENTRICULAR EJECTION FRACTION   QGS ejection fraction measures 69% , with an end-diastolic volume of 82 ml and an end-systolic volume of 26 ml.   IMPRESSION: No evidence for pharmacological induced ischemia.   Calculated ejection fraction is 69% with normal wall motion.     Electronically Signed   By: Markus Daft M.D.   On: 05/22/2013 14:17    ASSESSMENT AND PLAN: Chest pain with low risk of acute coronary syndrome- Low risk Myoview  Patient has occasional chest pain that's relieved with nitroglycerin. Mostly it's related to stress and anxiety. She's had negative stress Myoview in January 2015. She does have a family history of CAD. Recommend continue  when necessary nitroglycerin. She is to call she has an increase in symptoms.  Obesity- s/p GBP surg Weight loss program and exercise recommended.     Signed, Ermalinda Barrios, PA-C  01/08/2015 2:21 PM    [facility] [address], [place], [place]  [address] Phone: [phone]; Fax: [phone]

## 2015-01-10 ENCOUNTER — Encounter: Payer: Self-pay | Admitting: Family Medicine

## 2015-01-10 ENCOUNTER — Other Ambulatory Visit: Payer: Self-pay

## 2015-01-10 ENCOUNTER — Ambulatory Visit (INDEPENDENT_AMBULATORY_CARE_PROVIDER_SITE_OTHER): Payer: Self-pay | Admitting: Family Medicine

## 2015-01-10 VITALS — BP 100/80 | HR 87 | Temp 98.4°F | Ht 60.63 in | Wt 210.0 lb

## 2015-01-10 DIAGNOSIS — R7989 Other specified abnormal findings of blood chemistry: Secondary | ICD-10-CM

## 2015-01-10 DIAGNOSIS — E039 Hypothyroidism, unspecified: Secondary | ICD-10-CM

## 2015-01-10 DIAGNOSIS — R202 Paresthesia of skin: Secondary | ICD-10-CM

## 2015-01-10 DIAGNOSIS — Z23 Encounter for immunization: Secondary | ICD-10-CM

## 2015-01-10 DIAGNOSIS — L84 Corns and callosities: Secondary | ICD-10-CM

## 2015-01-10 DIAGNOSIS — K219 Gastro-esophageal reflux disease without esophagitis: Secondary | ICD-10-CM

## 2015-01-10 DIAGNOSIS — D563 Thalassemia minor: Secondary | ICD-10-CM

## 2015-01-10 DIAGNOSIS — D509 Iron deficiency anemia, unspecified: Secondary | ICD-10-CM | POA: Insufficient documentation

## 2015-01-10 LAB — VITAMIN B12: Vitamin B-12: 779 pg/mL (ref 211–911)

## 2015-01-10 LAB — IRON AND TIBC
%SAT: 3 % — ABNORMAL LOW (ref 11–50)
IRON: 13 ug/dL — AB (ref 40–190)
TIBC: 469 ug/dL — AB (ref 250–450)
UIBC: 456 ug/dL — ABNORMAL HIGH (ref 125–400)

## 2015-01-10 LAB — FERRITIN: FERRITIN: 3.4 ng/mL — AB (ref 10.0–291.0)

## 2015-01-10 NOTE — Progress Notes (Signed)
   Subjective:    Patient ID: [name], female    DOB: Apr 14, 1971, 44 y.o.   MRN: [id number]  HPI Patient seen to establish care. She has history of obesity, GERD, hypothyroidism. She had bariatric surgery in 2011 and reports this was gastric sleeve surgery. She has history of chronic anemia and states that she has been tested previously and has thalassemia trait. Previous cholecystectomy 2005. Umbilical hernia repair 3662.  She takes Protonix 40 mg twice daily. She had lab work at the cardiology last winter significant for normal TSH and hemoglobin 7.9. She takes Centrum multivitamin with iron once daily. She does complain of occasional lightheadedness with standing. She has regular menstrual cycles which usually 7 days duration sometimes heavy. She is no longer seeing gynecologist. She has frequent paresthesias involving both hands. No previous B12 testing.  Pain ventral surface right third toe. No history of injury. Last tetanus unknown. No flu vaccine this far.  Family history is significant for father and sister with type 2 diabetes. Mother and father had coronary disease. Patient is nonsmoker. She is divorced. No alcohol use.  Past Medical History  Diagnosis Date  . Chest pain   . Thyroid disease   . GERD (gastroesophageal reflux disease)   . Headache    Past Surgical History  Procedure Laterality Date  . Galbladder    . Umbilical hernia repair    . Bariatric surgery    . Cholecystectomy      reports that she has never smoked. She does not have any smokeless tobacco history on file. She reports that she does not drink alcohol or use illicit drugs. family history includes Diabetes in her father; Heart attack in her mother; Heart disease in her father; Hypertension in her father. There is no history of Stroke. No Known Allergies    Review of Systems  Constitutional: Negative for fatigue.  Eyes: Negative for visual disturbance.  Respiratory: Negative for cough, chest  tightness, shortness of breath and wheezing.   Cardiovascular: Negative for chest pain, palpitations and leg swelling.  Endocrine: Negative for polydipsia and polyuria.  Neurological: Negative for dizziness, seizures, syncope, weakness, light-headedness and headaches.       Objective:   Physical Exam  Constitutional: She appears well-developed and well-nourished.  Neck: Neck supple. No thyromegaly present.  Cardiovascular: Normal rate and regular rhythm.   Pulmonary/Chest: Effort normal and breath sounds normal. No respiratory distress. She has no wheezes. She has no rales.  Musculoskeletal: She exhibits no edema.  Skin:  She has a hardened callus on the ventral surface of the right third toe between the proximal and distal aspect. No plantar wart  Nailbeds are slightly pale          Assessment & Plan:  #1 history of hypothyroidism. TSH was normal in February. Continue current medication and recheck TSH in about 6 months #2 history of GERD symptomatically stable on Protonix No. #3 long history of microcytic anemia. Highly likely this is a combination of thalassemia and iron deficiency. She describes fairly heavy menses. Recheck CBC along with ferritin and serum iron. Continue iron replacement #4 paresthesias involving hands. No weakness. Rule out B12 deficiency with chronic PPI use and previous gastric surgery though she apparently had gastric sleeve which is low risk by itself #5 callus right third toe. This was trimmed with #15 blade and she noticed some immediate help afterwards #6 health maintenance. Tetanus booster given and flu vaccine given

## 2015-01-10 NOTE — Patient Instructions (Signed)
Iron-Rich Diet An iron-rich diet contains foods that are good sources of iron. Iron is an important mineral that helps your body produce hemoglobin. Hemoglobin is a protein in red blood cells that carries oxygen to the body's tissues. Sometimes, the iron level in your blood can be low. This may be caused by:  A lack of iron in your diet.  Blood loss.  Times of growth, such as during pregnancy or during a child's growth and development. Low levels of iron can cause a decrease in the number of red blood cells. This can result in iron deficiency anemia. Iron deficiency anemia symptoms include:  Tiredness.  Weakness.  Irritability.  Increased chance of infection. Here are some recommendations for daily iron intake:  Males older than 45 years of age need 8 mg of iron per day.  Women ages 19 to 50 need 18 mg of iron per day.  Pregnant women need 27 mg of iron per day, and women who are over 19 years of age and breastfeeding need 9 mg of iron per day.  Women over the age of 50 need 8 mg of iron per day. SOURCES OF IRON There are 2 types of iron that are found in food: heme iron and nonheme iron. Heme iron is absorbed by the body better than nonheme iron. Heme iron is found in meat, poultry, and fish. Nonheme iron is found in grains, beans, and vegetables. Heme Iron Sources Food / Iron (mg)  Chicken liver, 3 oz (85 g)/ 10 mg  Beef liver, 3 oz (85 g)/ 5.5 mg  Oysters, 3 oz (85 g)/ 8 mg  Beef, 3 oz (85 g)/ 2 to 3 mg  Shrimp, 3 oz (85 g)/ 2.8 mg  Turkey, 3 oz (85 g)/ 2 mg  Chicken, 3 oz (85 g) / 1 mg  Fish (tuna, halibut), 3 oz (85 g)/ 1 mg  Pork, 3 oz (85 g)/ 0.9 mg Nonheme Iron Sources Food / Iron (mg)  Ready-to-eat breakfast cereal, iron-fortified / 3.9 to 7 mg  Tofu,  cup / 3.4 mg  Kidney beans,  cup / 2.6 mg  Baked potato with skin / 2.7 mg  Asparagus,  cup / 2.2 mg  Avocado / 2 mg  Dried peaches,  cup / 1.6 mg  Raisins,  cup / 1.5 mg  Soy milk, 1 cup  / 1.5 mg  Whole-wheat bread, 1 slice / 1.2 mg  Spinach, 1 cup / 0.8 mg  Broccoli,  cup / 0.6 mg IRON ABSORPTION Certain foods can decrease the body's absorption of iron. Try to avoid these foods and beverages while eating meals with iron-containing foods:  Coffee.  Tea.  Fiber.  Soy. Foods containing vitamin C can help increase the amount of iron your body absorbs from iron sources, especially from nonheme sources. Eat foods with vitamin C along with iron-containing foods to increase your iron absorption. Foods that are high in vitamin C include many fruits and vegetables. Some good sources are:  Fresh orange juice.  Oranges.  Strawberries.  Mangoes.  Grapefruit.  Red bell peppers.  Green bell peppers.  Broccoli.  Potatoes with skin.  Tomato juice. Document Released: 12/09/2004 Document Revised: 07/20/2011 Document Reviewed: 10/16/2010 ExitCare Patient Information 2015 [employer]. This information is not intended to replace advice given to you by your health care provider. Make sure you discuss any questions you have with your health care provider.  

## 2015-01-10 NOTE — Progress Notes (Signed)
Pre visit review using our clinic review tool, if applicable. No additional management support is needed unless otherwise documented below in the visit note. 

## 2015-01-11 ENCOUNTER — Other Ambulatory Visit: Payer: Self-pay | Admitting: Family Medicine

## 2015-01-11 DIAGNOSIS — D649 Anemia, unspecified: Secondary | ICD-10-CM

## 2015-01-11 LAB — CBC WITH DIFFERENTIAL/PLATELET
Basophils Absolute: 0.1 10*3/uL (ref 0.0–0.1)
Basophils Relative: 1 % (ref 0–1)
EOS PCT: 1 % (ref 0–5)
Eosinophils Absolute: 0.1 10*3/uL (ref 0.0–0.7)
HEMATOCRIT: 30 % — AB (ref 36.0–46.0)
HEMOGLOBIN: 8 g/dL — AB (ref 12.0–15.0)
LYMPHS ABS: 4.1 10*3/uL — AB (ref 0.7–4.0)
LYMPHS PCT: 36 % (ref 12–46)
MCH: 13.1 pg — ABNORMAL LOW (ref 26.0–34.0)
MCHC: 26.7 g/dL — ABNORMAL LOW (ref 30.0–36.0)
MCV: 49 fL — ABNORMAL LOW (ref 78.0–100.0)
MONO ABS: 0.8 10*3/uL (ref 0.1–1.0)
Monocytes Relative: 7 % (ref 3–12)
NEUTROS ABS: 6.3 10*3/uL (ref 1.7–7.7)
Neutrophils Relative %: 55 % (ref 43–77)
Platelets: 376 10*3/uL (ref 150–400)
RBC: 6.12 MIL/uL — AB (ref 3.87–5.11)
RDW: 22.7 % — AB (ref 11.5–15.5)
WBC: 11.4 10*3/uL — AB (ref 4.0–10.5)

## 2015-01-22 DIAGNOSIS — E611 Iron deficiency: Secondary | ICD-10-CM | POA: Insufficient documentation

## 2015-01-22 DIAGNOSIS — Z9884 Bariatric surgery status: Secondary | ICD-10-CM | POA: Insufficient documentation

## 2015-01-22 DIAGNOSIS — K912 Postsurgical malabsorption, not elsewhere classified: Secondary | ICD-10-CM | POA: Insufficient documentation

## 2015-01-22 DIAGNOSIS — E559 Vitamin D deficiency, unspecified: Secondary | ICD-10-CM | POA: Insufficient documentation

## 2015-02-27 ENCOUNTER — Encounter: Payer: Self-pay | Admitting: Family Medicine

## 2015-02-28 ENCOUNTER — Ambulatory Visit (INDEPENDENT_AMBULATORY_CARE_PROVIDER_SITE_OTHER): Payer: Self-pay | Admitting: Family Medicine

## 2015-02-28 ENCOUNTER — Encounter: Payer: Self-pay | Admitting: Family Medicine

## 2015-02-28 VITALS — BP 110/70 | HR 68 | Temp 98.1°F | Ht 60.5 in | Wt 212.0 lb

## 2015-02-28 DIAGNOSIS — Z0000 Encounter for general adult medical examination without abnormal findings: Secondary | ICD-10-CM

## 2015-02-28 DIAGNOSIS — D509 Iron deficiency anemia, unspecified: Secondary | ICD-10-CM

## 2015-02-28 LAB — POCT HEMOGLOBIN: HEMOGLOBIN: 6.4 g/dL — AB (ref 12.2–16.2)

## 2015-02-28 NOTE — Progress Notes (Signed)
Pre visit review using our clinic review tool, if applicable. No additional management support is needed unless otherwise documented below in the visit note. 

## 2015-02-28 NOTE — Patient Instructions (Signed)
Scheduled mammogram at some point this year We need to consider Pap smear by next year Continue iron replacement.

## 2015-02-28 NOTE — Progress Notes (Signed)
Subjective:    Patient ID: [name], female    DOB: 1970/10/02, 44 y.o.   MRN: [id number]  HPI Patient seen for complete physical. Chronic problems include history of obesity, thalassemia trait, chronic anemia, GERD, hypothyroidism, obesity hypoventilation syndrome, history of obstructive sleep apnea. She had gastric sleeve surgery back in 2012 and lost about 100 pounds. She has been able to discontinue CPAP since then.  Recent issues with anemia. Hemoglobin 8.0. She had normal B12 with low ferritin, low iron, and increased TIBC. She has menses monthly and are sometimes heavy. She's been taking regular iron over the past month. There is high likelihood that she may be having some malabsorption issues from her prior surgery. She apparently has scheduled follow-up with hematologist middle of November in [place] but she prefers to see someone with [facility] if possible. She's not noted any hematemesis or hematochezia.  Patient thinks she had EGD and colonoscopy 2005. No history of peptic ulcer disease.  Last Pap smear 2 years ago and she defers repeat at this time. She's had all normals the past. Last mammogram about 2 years ago.  Past Medical History  Diagnosis Date  . Chest pain   . Thyroid disease   . GERD (gastroesophageal reflux disease)   . Headache    Past Surgical History  Procedure Laterality Date  . Galbladder    . Umbilical hernia repair    . Bariatric surgery    . Cholecystectomy      reports that she has never smoked. She does not have any smokeless tobacco history on file. She reports that she does not drink alcohol or use illicit drugs. family history includes Diabetes in her father and sister; Heart attack in her mother; Heart disease in her father; Hypertension in her father. There is no history of Stroke. No Known Allergies    Review of Systems  Constitutional: Positive for fatigue. Negative for fever, activity change, appetite change and unexpected  weight change.  HENT: Negative for ear pain, hearing loss, sore throat and trouble swallowing.   Eyes: Negative for visual disturbance.  Respiratory: Negative for cough and shortness of breath.   Cardiovascular: Negative for chest pain and palpitations.  Gastrointestinal: Negative for nausea, vomiting, abdominal pain, diarrhea, constipation and blood in stool.  Genitourinary: Negative for dysuria and hematuria.  Musculoskeletal: Negative for myalgias, back pain and arthralgias.  Skin: Negative for rash.  Neurological: Positive for light-headedness. Negative for dizziness, syncope and headaches.  Hematological: Negative for adenopathy.  Psychiatric/Behavioral: Negative for confusion and dysphoric mood.       Objective:   Physical Exam  Constitutional: She is oriented to person, place, and time. She appears well-developed and well-nourished. No distress.  HENT:  Right Ear: External ear normal.  Left Ear: External ear normal.  Mouth/Throat: Oropharynx is clear and moist.  Neck: Neck supple.  Cardiovascular: Normal rate and regular rhythm.   Pulmonary/Chest: Effort normal and breath sounds normal. No respiratory distress. She has no wheezes. She has no rales.  Abdominal: Soft. She exhibits no mass. There is no tenderness. There is no rebound and no guarding.  Genitourinary:  No rectal mass. Brown Hemoccult negative stool  Musculoskeletal: She exhibits no edema.  Lymphadenopathy:    She has no cervical adenopathy.  Neurological: She is alert and oriented to person, place, and time.          Assessment & Plan:  Complete physical. Multiple medical problems as above. Recent issues with iron deficiency anemia in a  patient with known thalassemia trait. Hemoccult negative. Suspect combination of regular menstrual loss and poor iron absorption. She's been on oral iron without increase in hemoglobin. Fingerstick hemoglobin today 6.4. No orthostatic symptoms. We discussed possible  transfusion and she wishes to defer until she sees hematologist. We will see if we can move up appointment any sooner. She may need iron infusion.

## 2015-03-04 ENCOUNTER — Telehealth: Payer: Self-pay | Admitting: Hematology and Oncology

## 2015-03-04 NOTE — Telephone Encounter (Signed)
New patient appt-s/w patient and gave np appt for 11/01 @ 3:45 w/Dr. Lindi Adie.  Referring Dr. Carolann Littler  Dx- anemia; ida

## 2015-03-12 ENCOUNTER — Encounter: Payer: Self-pay | Admitting: Hematology and Oncology

## 2015-03-12 ENCOUNTER — Telehealth: Payer: Self-pay | Admitting: Hematology and Oncology

## 2015-03-12 ENCOUNTER — Ambulatory Visit (HOSPITAL_BASED_OUTPATIENT_CLINIC_OR_DEPARTMENT_OTHER): Payer: Self-pay | Admitting: Hematology and Oncology

## 2015-03-12 ENCOUNTER — Ambulatory Visit (HOSPITAL_BASED_OUTPATIENT_CLINIC_OR_DEPARTMENT_OTHER): Payer: Self-pay

## 2015-03-12 VITALS — BP 105/67 | HR 75 | Temp 97.9°F | Resp 18 | Ht 60.5 in | Wt 214.1 lb

## 2015-03-12 DIAGNOSIS — K912 Postsurgical malabsorption, not elsewhere classified: Secondary | ICD-10-CM

## 2015-03-12 DIAGNOSIS — D563 Thalassemia minor: Secondary | ICD-10-CM

## 2015-03-12 DIAGNOSIS — D509 Iron deficiency anemia, unspecified: Secondary | ICD-10-CM | POA: Insufficient documentation

## 2015-03-12 DIAGNOSIS — Z9884 Bariatric surgery status: Secondary | ICD-10-CM

## 2015-03-12 LAB — CBC WITH DIFFERENTIAL/PLATELET
BASO%: 0.5 % (ref 0.0–2.0)
BASOS ABS: 0.1 10*3/uL (ref 0.0–0.1)
EOS ABS: 0.2 10*3/uL (ref 0.0–0.5)
EOS%: 1.4 % (ref 0.0–7.0)
HCT: 27 % — ABNORMAL LOW (ref 34.8–46.6)
HEMOGLOBIN: 7.3 g/dL — AB (ref 11.6–15.9)
LYMPH#: 3.6 10*3/uL — AB (ref 0.9–3.3)
LYMPH%: 29.2 % (ref 14.0–49.7)
MCH: 13.3 pg — ABNORMAL LOW (ref 25.1–34.0)
MCHC: 27 g/dL — ABNORMAL LOW (ref 31.5–36.0)
MCV: 49.1 fL — AB (ref 79.5–101.0)
MONO#: 0.7 10*3/uL (ref 0.1–0.9)
MONO%: 5.4 % (ref 0.0–14.0)
NEUT#: 7.9 10*3/uL — ABNORMAL HIGH (ref 1.5–6.5)
NEUT%: 63.5 % (ref 38.4–76.8)
NRBC: 0 % (ref 0–0)
RBC: 5.5 10*6/uL — ABNORMAL HIGH (ref 3.70–5.45)
RDW: 22.8 % — AB (ref 11.2–14.5)
WBC: 12.4 10*3/uL — ABNORMAL HIGH (ref 3.9–10.3)

## 2015-03-12 LAB — TECHNOLOGIST REVIEW

## 2015-03-12 NOTE — Telephone Encounter (Signed)
Appointments made and avs printed for patient °

## 2015-03-12 NOTE — Addendum Note (Signed)
Addended by: Prentiss Bells on: 03/12/2015 05:04 PM   Modules accepted: Medications

## 2015-03-12 NOTE — Progress Notes (Signed)
[facility] CONSULT NOTE  Patient Care Team: Eulas Post, MD as PCP - General (Family Medicine)  CHIEF COMPLAINTS/PURPOSE OF CONSULTATION:  Severe iron deficiency anemia  HISTORY OF PRESENTING ILLNESS:  [name] 44 y.o. female is here because of recent diagnosis of severe iron deficiency anemia with a hemoglobin of 6.8. She had a previous gastric bypass surgery in [facility] in 2012. Since then she had lost over 100 pounds. Recently she started noticing fatigue, shortness of breath with minimal exertion, dizziness to standing up. She was seen by her primary care physician and a hemoglobin was checked which came back at 6.8. She was subsequently referred to see [place]. She reports that these symptoms have been going on for sometime. But recently her symptoms have gotten worse. She also tells me that she was diagnosed with thalassemia minor. But she has never had anemia. She is always had normal hemoglobin levels. She denies any hematemesis or melena.  I reviewed her records extensively and collaborated the history with the patient.  MEDICAL HISTORY:  Past Medical History  Diagnosis Date  . Chest pain   . Thyroid disease   . GERD (gastroesophageal reflux disease)   . Headache     SURGICAL HISTORY: Past Surgical History  Procedure Laterality Date  . Galbladder    . Umbilical hernia repair    . Bariatric surgery    . Cholecystectomy      SOCIAL HISTORY: Social History   Social History  . Marital Status: Married    Spouse Name: N/A  . Number of Children: N/A  . Years of Education: N/A   Occupational History  . Not on file.   Social History Main Topics  . Smoking status: Never Smoker   . Smokeless tobacco: Not on file  . Alcohol Use: No  . Drug Use: No  . Sexual Activity: Not on file   Other Topics Concern  . Not on file   Social History Narrative    FAMILY HISTORY: Family History  Problem Relation Age of Onset  . Heart disease Father   .  Hypertension Father   . Diabetes Father   . Heart attack Mother   . Stroke Neg Hx   . Diabetes Sister     ALLERGIES:  has No Known Allergies.  MEDICATIONS:  Current Outpatient Prescriptions  Medication Sig Dispense Refill  . acetaminophen (TYLENOL) 500 MG tablet Take 500 mg by mouth daily as needed for mild pain.    . Cholecalciferol (VITAMIN D-3 PO) Take 2,000 Int'l Units by mouth daily.     . furosemide (LASIX) 20 MG tablet Take 1 tablet (20 mg total) by mouth daily as needed for fluid or edema. 90 tablet 3  . levothyroxine (SYNTHROID, LEVOTHROID) 125 MCG tablet Take 1 tablet (125 mcg total) by mouth daily. 90 tablet 3  . Multiple Vitamins-Iron (ONE DAILY MULTIVITAMIN/IRON PO) Take by mouth daily.    . nitroGLYCERIN (NITROSTAT) 0.4 MG SL tablet Place 1 tablet (0.4 mg total) under the tongue every 5 (five) minutes as needed for chest pain. 25 tablet 3  . pantoprazole (PROTONIX) 40 MG tablet Take 1 tablet (40 mg total) by mouth 2 (two) times daily. 180 tablet 1   No current facility-administered medications for this visit.    REVIEW OF SYSTEMS:   Constitutional: Denies fevers, chills or abnormal night sweats Eyes: Denies blurriness of vision, double vision or watery eyes Ears, nose, mouth, throat, and face: Denies mucositis or sore throat Respiratory: Shortness  of breath to minimal exertion Cardiovascular: Denies palpitation, chest discomfort or lower extremity swelling Gastrointestinal:  Denies nausea, heartburn or change in bowel habits Skin: Denies abnormal skin rashes Lymphatics: Denies new lymphadenopathy or easy bruising Neurological:Denies numbness, tingling or new weaknesses Behavioral/Psych: Mood is stable All other systems were reviewed with the patient and are negative.  PHYSICAL EXAMINATION: ECOG PERFORMANCE STATUS: 2 - Symptomatic, <50% confined to bed  Filed Vitals:   03/12/15 1542  BP: 105/67  Pulse: 75  Temp: 97.9 F (36.6 C)  Resp: 18   Filed Weights    03/12/15 1542  Weight: 214 lb 1.6 oz (97.115 kg)    GENERAL:alert, no distress and comfortable SKIN: skin color, texture, turgor are normal, no rashes or significant lesions EYES: normal, conjunctiva are pink and non-injected, sclera clear OROPHARYNX:no exudate, no erythema and lips, buccal mucosa, and tongue normal  NECK: supple, thyroid normal size, non-tender, without nodularity LYMPH:  no palpable lymphadenopathy in the cervical, axillary or inguinal LUNGS: clear to auscultation and percussion with normal breathing effort HEART: regular rate & rhythm and no murmurs and no lower extremity edema ABDOMEN:abdomen soft, non-tender and normal bowel sounds Musculoskeletal:no cyanosis of digits and no clubbing  PSYCH: alert & oriented x 3 with fluent speech NEURO: no focal motor/sensory deficits  LABORATORY DATA:  I have reviewed the data as listed Lab Results  Component Value Date   WBC 11.4* 01/10/2015   HGB 6.4* 02/28/2015   HCT 30.0* 01/10/2015   MCV 49.0* 01/10/2015   PLT 376 01/10/2015   Lab Results  Component Value Date   NA 137 06/28/2014   K 3.5 06/28/2014   CL 105 06/28/2014   CO2 30 06/28/2014     ASSESSMENT AND PLAN:  1. 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 Because of her prior gastric bypass surgery she has significant malabsorption of iron.  We will check her iron studies today.  I recommended giving her IV iron tomorrow and in one week. This will be with Feraheme.   I suspect that she might need IV iron infusion at least every 6 months.  We will see her back in one month to recheck her CBC and iron studies and ferritin.   2.thalassemia minor     All questions were answered. The patient knows to call the clinic with any problems, questions or concerns.    Rulon Eisenmenger, MD 4:28 PM

## 2015-03-13 ENCOUNTER — Encounter: Payer: Self-pay | Admitting: Hematology and Oncology

## 2015-03-13 ENCOUNTER — Ambulatory Visit (HOSPITAL_BASED_OUTPATIENT_CLINIC_OR_DEPARTMENT_OTHER): Payer: Self-pay

## 2015-03-13 VITALS — BP 105/50 | HR 66 | Temp 98.8°F | Resp 20

## 2015-03-13 DIAGNOSIS — K9089 Other intestinal malabsorption: Secondary | ICD-10-CM

## 2015-03-13 DIAGNOSIS — D509 Iron deficiency anemia, unspecified: Secondary | ICD-10-CM

## 2015-03-13 DIAGNOSIS — Z9884 Bariatric surgery status: Secondary | ICD-10-CM

## 2015-03-13 LAB — IRON AND TIBC CHCC
%SAT: 3 % — AB (ref 21–57)
Iron: 12 ug/dL — ABNORMAL LOW (ref 41–142)
TIBC: 434 ug/dL (ref 236–444)
UIBC: 422 ug/dL — AB (ref 120–384)

## 2015-03-13 LAB — FERRITIN CHCC: Ferritin: 7 ng/ml — ABNORMAL LOW (ref 9–269)

## 2015-03-13 MED ORDER — SODIUM CHLORIDE 0.9 % IV SOLN
510.0000 mg | Freq: Once | INTRAVENOUS | Status: AC
  Administered 2015-03-13: 510 mg via INTRAVENOUS
  Filled 2015-03-13: qty 17

## 2015-03-13 MED ORDER — SODIUM CHLORIDE 0.9 % IV SOLN
Freq: Once | INTRAVENOUS | Status: AC
  Administered 2015-03-13: 15:00:00 via INTRAVENOUS

## 2015-03-13 NOTE — Progress Notes (Signed)
No asst available for fereheme

## 2015-03-13 NOTE — Patient Instructions (Signed)

## 2015-03-19 ENCOUNTER — Ambulatory Visit (HOSPITAL_BASED_OUTPATIENT_CLINIC_OR_DEPARTMENT_OTHER): Payer: Self-pay

## 2015-03-19 VITALS — BP 108/58 | HR 64 | Temp 98.1°F | Resp 20

## 2015-03-19 DIAGNOSIS — D509 Iron deficiency anemia, unspecified: Secondary | ICD-10-CM

## 2015-03-19 MED ORDER — SODIUM CHLORIDE 0.9 % IV SOLN
510.0000 mg | Freq: Once | INTRAVENOUS | Status: AC
  Administered 2015-03-19: 510 mg via INTRAVENOUS
  Filled 2015-03-19: qty 17

## 2015-03-19 MED ORDER — SODIUM CHLORIDE 0.9 % IV SOLN
Freq: Once | INTRAVENOUS | Status: AC
  Administered 2015-03-19: 15:00:00 via INTRAVENOUS

## 2015-03-19 NOTE — Patient Instructions (Signed)

## 2015-04-09 ENCOUNTER — Other Ambulatory Visit (HOSPITAL_BASED_OUTPATIENT_CLINIC_OR_DEPARTMENT_OTHER): Payer: Self-pay

## 2015-04-09 ENCOUNTER — Telehealth: Payer: Self-pay | Admitting: Hematology and Oncology

## 2015-04-09 ENCOUNTER — Encounter: Payer: Self-pay | Admitting: Hematology and Oncology

## 2015-04-09 ENCOUNTER — Ambulatory Visit (HOSPITAL_BASED_OUTPATIENT_CLINIC_OR_DEPARTMENT_OTHER): Payer: Self-pay | Admitting: Hematology and Oncology

## 2015-04-09 VITALS — BP 115/96 | HR 72 | Temp 98.9°F | Resp 18 | Ht 60.5 in | Wt 210.4 lb

## 2015-04-09 DIAGNOSIS — K9089 Other intestinal malabsorption: Secondary | ICD-10-CM

## 2015-04-09 DIAGNOSIS — D509 Iron deficiency anemia, unspecified: Secondary | ICD-10-CM

## 2015-04-09 LAB — CBC WITH DIFFERENTIAL/PLATELET
BASO%: 0.5 % (ref 0.0–2.0)
Basophils Absolute: 0.1 10*3/uL (ref 0.0–0.1)
EOS%: 1.2 % (ref 0.0–7.0)
Eosinophils Absolute: 0.1 10*3/uL (ref 0.0–0.5)
HEMATOCRIT: 38.7 % (ref 34.8–46.6)
HEMOGLOBIN: 11.3 g/dL — AB (ref 11.6–15.9)
LYMPH#: 2.8 10*3/uL (ref 0.9–3.3)
LYMPH%: 26.8 % (ref 14.0–49.7)
MCH: 16.8 pg — ABNORMAL LOW (ref 25.1–34.0)
MCHC: 29.2 g/dL — ABNORMAL LOW (ref 31.5–36.0)
MCV: 57.4 fL — ABNORMAL LOW (ref 79.5–101.0)
MONO#: 0.4 10*3/uL (ref 0.1–0.9)
MONO%: 3.9 % (ref 0.0–14.0)
NEUT#: 7 10*3/uL — ABNORMAL HIGH (ref 1.5–6.5)
NEUT%: 67.6 % (ref 38.4–76.8)
NRBC: 0 % (ref 0–0)
Platelets: 252 10*3/uL (ref 145–400)
RBC: 6.74 10*6/uL — ABNORMAL HIGH (ref 3.70–5.45)
WBC: 10.4 10*3/uL — ABNORMAL HIGH (ref 3.9–10.3)

## 2015-04-09 NOTE — Telephone Encounter (Signed)
Gave patient avs report and appointments for February.  °

## 2015-04-09 NOTE — Assessment & Plan Note (Signed)
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 Because of her prior gastric bypass surgery she has significant malabsorption of iron.  Treatment summary: IV iron  Given 11 2016 Response to IV iron therapy: Thalassemia minor  Return to clinic in 3 months for labs and follow-up

## 2015-04-09 NOTE — Addendum Note (Signed)
Addended by: Jonelle Sports K on: 04/09/2015 05:00 PM   Modules accepted: Medications

## 2015-04-09 NOTE — Progress Notes (Signed)
Patient Care Team: Eulas Post, MD as PCP - General (Family Medicine)  DIAGNOSIS: iron deficiency anemia due to malabsorption.  SUMMARY OF ONCOLOGIC HISTORY:IV iron treatment given 03/13/2015 CHIEF COMPLIANT: follow-up 1 month after IV iron treatment  INTERVAL HISTORY: [name] is a 44 year old lady with above-mentioned history of severe iron deficiency anemia along with thalassemia minor. She had profound iron deficiency and received IV iron treatment in November 2016. She was extremely fatigued and short of breath. Remarkable improvement was seen over the past month after the IV iron was given. She has a lot of energy. She feels a lot better.  REVIEW OF SYSTEMS:   Constitutional: Denies fevers, chills or abnormal weight loss Eyes: Denies blurriness of vision Ears, nose, mouth, throat, and face: Denies mucositis or sore throat Respiratory: Denies cough, dyspnea or wheezes Cardiovascular: Denies palpitation, chest discomfort or lower extremity swelling Gastrointestinal:  Denies nausea, heartburn or change in bowel habits Skin: Denies abnormal skin rashes Lymphatics: Denies new lymphadenopathy or easy bruising Neurological:Denies numbness, tingling or new weaknesses Behavioral/Psych: Mood is stable, no new changes   All other systems were reviewed with the patient and are negative.  I have reviewed the past medical history, past surgical history, social history and family history with the patient and they are unchanged from previous note.  ALLERGIES:  has No Known Allergies.  MEDICATIONS:  Current Outpatient Prescriptions  Medication Sig Dispense Refill  . acetaminophen (TYLENOL) 500 MG tablet Take 500 mg by mouth daily as needed for mild pain.    . Cholecalciferol (VITAMIN D-3 PO) Take 2,000 Int'l Units by mouth daily.     . ferrous sulfate 325 (65 FE) MG tablet Take 325 mg by mouth.    . furosemide (LASIX) 20 MG tablet Take 1 tablet (20 mg total) by mouth daily as  needed for fluid or edema. 90 tablet 3  . levothyroxine (SYNTHROID, LEVOTHROID) 125 MCG tablet Take 1 tablet (125 mcg total) by mouth daily. 90 tablet 3  . Multiple Vitamins-Iron (ONE DAILY MULTIVITAMIN/IRON PO) Take by mouth daily.    . nitroGLYCERIN (NITROSTAT) 0.4 MG SL tablet Place 1 tablet (0.4 mg total) under the tongue every 5 (five) minutes as needed for chest pain. 25 tablet 3  . pantoprazole (PROTONIX) 40 MG tablet Take 1 tablet (40 mg total) by mouth 2 (two) times daily. 180 tablet 1   No current facility-administered medications for this visit.    PHYSICAL EXAMINATION: ECOG PERFORMANCE STATUS: 1 - Symptomatic but completely ambulatory  Filed Vitals:   04/09/15 1458  BP: 115/96  Pulse: 72  Temp: 98.9 F (37.2 C)  Resp: 18   Filed Weights   04/09/15 1458  Weight: 210 lb 6.4 oz (95.437 kg)    GENERAL:alert, no distress and comfortable SKIN: skin color, texture, turgor are normal, no rashes or significant lesions EYES: normal, Conjunctiva are pink and non-injected, sclera clear OROPHARYNX:no exudate, no erythema and lips, buccal mucosa, and tongue normal  NECK: supple, thyroid normal size, non-tender, without nodularity LYMPH:  no palpable lymphadenopathy in the cervical, axillary or inguinal LUNGS: clear to auscultation and percussion with normal breathing effort HEART: regular rate & rhythm and no murmurs and no lower extremity edema ABDOMEN:abdomen soft, non-tender and normal bowel sounds Musculoskeletal:no cyanosis of digits and no clubbing  NEURO: alert & oriented x 3 with fluent speech, no focal motor/sensory deficits  LABORATORY DATA:  I have reviewed the data as listed   Chemistry      Component Value  Date/Time   NA 137 06/28/2014 1223   K 3.5 06/28/2014 1223   CL 105 06/28/2014 1223   CO2 30 06/28/2014 1223   BUN 7 06/28/2014 1223   CREATININE 0.47 06/28/2014 1223      Component Value Date/Time   CALCIUM 8.3* 06/28/2014 1223   ALKPHOS 89 06/28/2014  1223   AST 15 06/28/2014 1223   ALT 11 06/28/2014 1223   BILITOT 0.3 06/28/2014 1223       Lab Results  Component Value Date   WBC 10.4* 04/09/2015   HGB 11.3* 04/09/2015   HCT 38.7 04/09/2015   MCV 57.4* 04/09/2015   PLT 252 04/09/2015   NEUTROABS 7.0* 04/09/2015   ASSESSMENT & PLAN:  Iron deficiency anemia 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 Because of her prior gastric bypass surgery she has significant malabsorption of iron.  Treatment summary: IV iron  Given 03/13/2015 Response to IV iron therapy: improvement in hemoglobin from 7.4-11.3 Patient also had marked improvement in clinical symptoms. Her energy levels have improved. Thalassemia minor  Return to clinic in 3 months for labs and follow-up   No orders of the defined types were placed in this encounter.   The patient has a good understanding of the overall plan. she agrees with it. she will call with any problems that may develop before the next visit here.   Rulon Eisenmenger, MD 04/09/2015

## 2015-04-10 LAB — IRON AND TIBC CHCC
%SAT: 26 % (ref 21–57)
IRON: 72 ug/dL (ref 41–142)
TIBC: 279 ug/dL (ref 236–444)
UIBC: 207 ug/dL (ref 120–384)

## 2015-04-10 LAB — FERRITIN CHCC: Ferritin: 142 ng/ml (ref 9–269)

## 2015-05-22 ENCOUNTER — Encounter: Payer: Self-pay | Admitting: Family Medicine

## 2015-05-22 ENCOUNTER — Encounter: Payer: Self-pay | Admitting: Hematology and Oncology

## 2015-05-23 NOTE — Telephone Encounter (Signed)
Pt seen on 10/20 for CPE. Should pt come to our office or oncology?

## 2015-05-23 NOTE — Telephone Encounter (Signed)
Pt reports intermittent episodes of bright red blood, yesterday, today, and last month.  Small amount, stops, no hx of hemorrhoids.  Advised pt that red blood indicates hemorrhoids bleeding lower in the GI tract and is more than likely hemorrhoids or fissure/small tear.   Advised pt to call if bleeding will not stop or becomes copious.  Educated pt on signs of GI bleed.  Pt voiced understanding.

## 2015-05-25 ENCOUNTER — Encounter: Payer: Self-pay | Admitting: Family Medicine

## 2015-05-27 ENCOUNTER — Other Ambulatory Visit: Payer: Self-pay | Admitting: Family Medicine

## 2015-05-27 DIAGNOSIS — K921 Melena: Secondary | ICD-10-CM

## 2015-06-16 ENCOUNTER — Encounter: Payer: Self-pay | Admitting: Cardiology

## 2015-06-17 ENCOUNTER — Telehealth: Payer: Self-pay | Admitting: Cardiology

## 2015-06-17 MED ORDER — NITROGLYCERIN 0.4 MG SL SUBL: 0.4000 mg | SUBLINGUAL_TABLET | SUBLINGUAL | Status: DC | PRN

## 2015-06-17 NOTE — Telephone Encounter (Signed)
Follow Up   Pt req a call back. No further detatils

## 2015-06-17 NOTE — Telephone Encounter (Signed)
Moved patients appointment to the morning as requested Refilled NTG

## 2015-07-04 ENCOUNTER — Ambulatory Visit: Payer: Self-pay | Admitting: Cardiology

## 2015-07-04 ENCOUNTER — Ambulatory Visit (INDEPENDENT_AMBULATORY_CARE_PROVIDER_SITE_OTHER): Payer: Self-pay | Admitting: Cardiology

## 2015-07-04 ENCOUNTER — Encounter: Payer: Self-pay | Admitting: Cardiology

## 2015-07-04 VITALS — BP 110/60 | HR 64 | Ht 62.0 in | Wt 220.0 lb

## 2015-07-04 DIAGNOSIS — E669 Obesity, unspecified: Secondary | ICD-10-CM

## 2015-07-04 DIAGNOSIS — D649 Anemia, unspecified: Secondary | ICD-10-CM

## 2015-07-04 DIAGNOSIS — R079 Chest pain, unspecified: Secondary | ICD-10-CM

## 2015-07-04 NOTE — Patient Instructions (Signed)
Medication Instructions:  Your physician recommends that you continue on your current medications as directed. Please refer to the Current Medication list given to you today.  Labwork: none  Testing/Procedures: none  Follow-Up: Your physician wants you to follow-up in: 6 month ov with Dr Claiborne Billings at the [facility] office  You will receive a reminder letter in the mail two months in advance. If you don't receive a letter, please call our office to schedule the follow-up appointment.  Any Other Special Instructions Will Be Listed Below (If Applicable). Work harder on diet and weight loss   If you need a refill on your cardiac medications before your next appointment, please call your pharmacy.

## 2015-07-04 NOTE — Progress Notes (Signed)
Cardiology Office Note   Date:  07/04/2015   ID:  [name], DOB May 02, 1971, MRN [id number]  PCP:  Eulas Post, MD  Cardiologist: Darlin Coco MD  Chief Complaint  Patient presents with  . scheduled follow up      History of Present Illness: [name] is a 45 y.o. female who presents for a scheduled follow-up visit.  This pleasant 45 year old woman is seen for a office visit. We see several of her family members. . She has an interesting past history. At one time she weighed almost on 370 pounds. She underwent a gastric bypass at [facility] by Dr. Volanda Napoleon and her weight is down approximately 150 pounds from its peak.  The patient was a diabetic prior to her gastric bypass. When she had the gastric bypass her blood sugars normalized almost immediately. The patient has a history of hypothyroidism and is on Synthroid. The patient recently has had some chest discomfort and left arm discomfort which occurred after an argument with her sister. The patient does not have any history of ischemic heart disease. She was admitted in January 2015 and had a Myoview stress test which was normal and showed no ischemia and her ejection fraction was 69%. The Myoview was on 05/22/13. Echocardiogram on 05/23/13 showed normal left ventricular systolic and normal diastolic function and no wall motion abnormalities. No valve abnormalities. Since last visit she has been doing well.  She has gained 10 pounds back since November 2016 however.  She is sleeping okay.  She has had a history of severe anemia.  She is now followed by hematology.  She has had known history of thalassemia minor and also his iron deficient.  She has been getting iron infusions intravenously at the cancer center.  She has noted occasional fresh blood in her stool and will be having an appointment with her gastroenterologist Dr. Cristina Gong soon. Since last visit she has had occasional chest pain.  On one  occasion she took one of her father's nitroglycerin and it seemed to help.  Past Medical History  Diagnosis Date  . Chest pain   . Thyroid disease   . GERD (gastroesophageal reflux disease)   . Headache     Past Surgical History  Procedure Laterality Date  . Galbladder    . Umbilical hernia repair    . Bariatric surgery    . Cholecystectomy       Current Outpatient Prescriptions  Medication Sig Dispense Refill  . acetaminophen (TYLENOL) 500 MG tablet Take 500 mg by mouth daily as needed for mild pain.    . Cholecalciferol (VITAMIN D-3 PO) Take 2,000 Int'l Units by mouth daily.     . furosemide (LASIX) 20 MG tablet Take 1 tablet (20 mg total) by mouth daily as needed for fluid or edema. 90 tablet 3  . levothyroxine (SYNTHROID, LEVOTHROID) 125 MCG tablet Take 1 tablet (125 mcg total) by mouth daily. 90 tablet 3  . Multiple Vitamins-Iron (ONE DAILY MULTIVITAMIN/IRON PO) Take 1 tablet by mouth daily.     . nitroGLYCERIN (NITROSTAT) 0.4 MG SL tablet Place 1 tablet (0.4 mg total) under the tongue every 5 (five) minutes as needed for chest pain. 25 tablet 3  . pantoprazole (PROTONIX) 40 MG tablet Take 1 tablet (40 mg total) by mouth 2 (two) times daily. 180 tablet 1   No current facility-administered medications for this visit.    Allergies:   Review of patient's allergies indicates no known allergies.  Social History:  The patient  reports that she has never smoked. She does not have any smokeless tobacco history on file. She reports that she does not drink alcohol or use illicit drugs.   Family History:  The patient's family history includes Diabetes in her father and sister; Heart attack in her mother; Heart disease in her father; Hypertension in her father. There is no history of Stroke.    ROS:  Please see the history of present illness.   Otherwise, review of systems are positive for none.   All other systems are reviewed and negative.    PHYSICAL EXAM: VS:  BP 110/60  mmHg  Pulse 64  Ht 5\' 2"  (1.575 m)  Wt 220 lb (99.791 kg)  BMI 40.23 kg/m2 , BMI Body mass index is 40.23 kg/(m^2). GEN: Well nourished, well developed, in no acute distress HEENT: normal Neck: no JVD, carotid bruits, or masses Cardiac: RRR; no murmurs, rubs, or gallops,no edema  Respiratory:  clear to auscultation bilaterally, normal work of breathing GI: soft, nontender, nondistended, + BS MS: no deformity or atrophy Skin: warm and dry, no rash Neuro:  Strength and sensation are intact Psych: euthymic mood, full affect   EKG:  EKG is ordered today. The ekg ordered today demonstrates normal sinus rhythm.  Within normal limits.   Recent Labs: 04/09/2015: HGB 11.3*; Platelets 252    Lipid Panel    Component Value Date/Time   CHOL 123 06/28/2014 1223   TRIG 82.0 06/28/2014 1223   HDL 37.10* 06/28/2014 1223   CHOLHDL 3 06/28/2014 1223   VLDL 16.4 06/28/2014 1223   LDLCALC 70 06/28/2014 1223      Wt Readings from Last 3 Encounters:  07/04/15 220 lb (99.791 kg)  04/09/15 210 lb 6.4 oz (95.437 kg)  03/12/15 214 lb 1.6 oz (97.115 kg)         ASSESSMENT AND PLAN:  1.Occasional chest pain.Marland Kitchen History of normal Myoview stress test on 05/22/13 2. Hypothyroidism 3. Morbid obesity, with past history of gastric sleeve procedure. 4. Mild depression 5. Thalassemia trait with chronic anemia.  The patient also has iron deficiency anemia.  She is now followed by hematology.   Current medicines are reviewed at length with the patient today.  The patient does not have concerns regarding medicines.  The following changes have been made:  no change  Labs/ tests ordered today include:   Orders Placed This Encounter  Procedures  . EKG 12-Lead     Disposition:   Continue current medication.  She will follow-up with Dr. Shelva Majestic for follow-up office visit in 6 months.  Dr. Claiborne Billings will also be seeing the patient's father. The patient needs to continue to work hard on  weight loss in the interim.  Berna Spare MD 07/04/2015 1:09 PM    [address], [place], [place]  [address] Phone: [phone]; Fax: [phone]

## 2015-07-07 NOTE — Assessment & Plan Note (Signed)
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 Because of her prior gastric bypass surgery she has significant malabsorption of iron.  Treatment summary: IV iron Given 03/13/2015 Response to IV iron therapy: hemoglobin is Patient also had marked improvement in clinical symptoms. Her energy levels have improved. Thalassemia minor  Return to clinic in 3 months for labs and follow-up

## 2015-07-08 ENCOUNTER — Encounter: Payer: Self-pay | Admitting: Hematology and Oncology

## 2015-07-08 ENCOUNTER — Other Ambulatory Visit (HOSPITAL_BASED_OUTPATIENT_CLINIC_OR_DEPARTMENT_OTHER): Payer: Self-pay

## 2015-07-08 ENCOUNTER — Telehealth: Payer: Self-pay | Admitting: Hematology and Oncology

## 2015-07-08 ENCOUNTER — Ambulatory Visit (HOSPITAL_BASED_OUTPATIENT_CLINIC_OR_DEPARTMENT_OTHER): Payer: Self-pay | Admitting: Hematology and Oncology

## 2015-07-08 VITALS — BP 95/62 | HR 84 | Temp 98.4°F | Resp 18 | Ht 62.0 in | Wt 215.4 lb

## 2015-07-08 DIAGNOSIS — D509 Iron deficiency anemia, unspecified: Secondary | ICD-10-CM

## 2015-07-08 DIAGNOSIS — K909 Intestinal malabsorption, unspecified: Secondary | ICD-10-CM

## 2015-07-08 DIAGNOSIS — D563 Thalassemia minor: Secondary | ICD-10-CM

## 2015-07-08 DIAGNOSIS — D500 Iron deficiency anemia secondary to blood loss (chronic): Secondary | ICD-10-CM

## 2015-07-08 LAB — CBC WITH DIFFERENTIAL/PLATELET
BASO%: 0.6 % (ref 0.0–2.0)
Basophils Absolute: 0.1 10*3/uL (ref 0.0–0.1)
EOS ABS: 0.2 10*3/uL (ref 0.0–0.5)
EOS%: 1.2 % (ref 0.0–7.0)
HEMATOCRIT: 43.2 % (ref 34.8–46.6)
HGB: 13.7 g/dL (ref 11.6–15.9)
LYMPH%: 33.3 % (ref 14.0–49.7)
MCH: 19 pg — ABNORMAL LOW (ref 25.1–34.0)
MCHC: 31.7 g/dL (ref 31.5–36.0)
MCV: 60 fL — ABNORMAL LOW (ref 79.5–101.0)
MONO#: 0.6 10*3/uL (ref 0.1–0.9)
MONO%: 4.7 % (ref 0.0–14.0)
NEUT%: 60.2 % (ref 38.4–76.8)
NEUTROS ABS: 7.6 10*3/uL — AB (ref 1.5–6.5)
NRBC: 0 % (ref 0–0)
PLATELETS: 285 10*3/uL (ref 145–400)
RBC: 7.2 10*6/uL — AB (ref 3.70–5.45)
RDW: 14.9 % — AB (ref 11.2–14.5)
WBC: 12.6 10*3/uL — AB (ref 3.9–10.3)
lymph#: 4.2 10*3/uL — ABNORMAL HIGH (ref 0.9–3.3)

## 2015-07-08 LAB — IRON AND TIBC
%SAT: 14 % — ABNORMAL LOW (ref 21–57)
IRON: 52 ug/dL (ref 41–142)
TIBC: 364 ug/dL (ref 236–444)
UIBC: 312 ug/dL (ref 120–384)

## 2015-07-08 LAB — FERRITIN: Ferritin: 41 ng/ml (ref 9–269)

## 2015-07-08 NOTE — Telephone Encounter (Signed)
Gave patient avs report and appointments for May  °

## 2015-07-08 NOTE — Progress Notes (Signed)
Patient Care Team: Eulas Post, MD as PCP - General (Family Medicine)  DIAGNOSIS: chronic recurrent iron deficiency anemia due to blood loss and malabsorption  CHIEF COMPLIANT: follow-up to review blood work  INTERVAL HISTORY: [name] is a 45 year old with above-mentioned history of chronic recurrent iron deficiency anemia who is here for three-month follow-up after receiving IV iron treatment. She reports that her and her genitals and back to normal. She does have mild dizziness. The dizziness was profound previously. She does not have palpitations or shortness of breath or exertion. She has noticed some blood in stool and has called her gastroenterologist. She has an appointment coming up soon.  REVIEW OF SYSTEMS:   Constitutional: Denies fevers, chills or abnormal weight loss Eyes: Denies blurriness of vision Ears, nose, mouth, throat, and face: Denies mucositis or sore throat Respiratory: Denies cough, dyspnea or wheezes Cardiovascular: Denies palpitation, chest discomfort Gastrointestinal:  Bright red blood per rectum as well as occasional dark stools Skin: Denies abnormal skin rashes Lymphatics: Denies new lymphadenopathy or easy bruising Neurological:Denies numbness, tingling or new weaknesses Behavioral/Psych: Mood is stable, no new changes  Extremities: No lower extremity edema  All other systems were reviewed with the patient and are negative.  I have reviewed the past medical history, past surgical history, social history and family history with the patient and they are unchanged from previous note.  ALLERGIES:  has No Known Allergies.  MEDICATIONS:  Current Outpatient Prescriptions  Medication Sig Dispense Refill  . acetaminophen (TYLENOL) 500 MG tablet Take 500 mg by mouth daily as needed for mild pain.    . Cholecalciferol (VITAMIN D-3 PO) Take 2,000 Int'l Units by mouth daily.     . furosemide (LASIX) 20 MG tablet Take 1 tablet (20 mg total) by mouth  daily as needed for fluid or edema. 90 tablet 3  . levothyroxine (SYNTHROID, LEVOTHROID) 125 MCG tablet Take 1 tablet (125 mcg total) by mouth daily. 90 tablet 3  . Multiple Vitamins-Iron (ONE DAILY MULTIVITAMIN/IRON PO) Take 1 tablet by mouth daily.     . nitroGLYCERIN (NITROSTAT) 0.4 MG SL tablet Place 1 tablet (0.4 mg total) under the tongue every 5 (five) minutes as needed for chest pain. 25 tablet 3  . pantoprazole (PROTONIX) 40 MG tablet Take 1 tablet (40 mg total) by mouth 2 (two) times daily. 180 tablet 1   No current facility-administered medications for this visit.    PHYSICAL EXAMINATION: ECOG PERFORMANCE STATUS: 1 - Symptomatic but completely ambulatory  Filed Vitals:   07/08/15 1428  BP: 95/62  Pulse: 84  Temp: 98.4 F (36.9 C)  Resp: 18   Filed Weights   07/08/15 1428  Weight: 215 lb 6.4 oz (97.705 kg)    GENERAL:alert, no distress and comfortable SKIN: skin color, texture, turgor are normal, no rashes or significant lesions EYES: normal, Conjunctiva are pink and non-injected, sclera clear OROPHARYNX:no exudate, no erythema and lips, buccal mucosa, and tongue normal  NECK: supple, thyroid normal size, non-tender, without nodularity LYMPH:  no palpable lymphadenopathy in the cervical, axillary or inguinal LUNGS: clear to auscultation and percussion with normal breathing effort HEART: regular rate & rhythm and no murmurs and no lower extremity edema ABDOMEN:abdomen soft, non-tender and normal bowel sounds MUSCULOSKELETAL:no cyanosis of digits and no clubbing  NEURO: alert & oriented x 3 with fluent speech, no focal motor/sensory deficits EXTREMITIES: No lower extremity edema  LABORATORY DATA:  I have reviewed the data as listed   Chemistry  Component Value Date/Time   NA 137 06/28/2014 1223   K 3.5 06/28/2014 1223   CL 105 06/28/2014 1223   CO2 30 06/28/2014 1223   BUN 7 06/28/2014 1223   CREATININE 0.47 06/28/2014 1223      Component Value Date/Time     CALCIUM 8.3* 06/28/2014 1223   ALKPHOS 89 06/28/2014 1223   AST 15 06/28/2014 1223   ALT 11 06/28/2014 1223   BILITOT 0.3 06/28/2014 1223       Lab Results  Component Value Date   WBC 12.6* 07/08/2015   HGB 13.7 07/08/2015   HCT 43.2 07/08/2015   MCV 60.0* 07/08/2015   PLT 285 07/08/2015   NEUTROABS 7.6* 07/08/2015     ASSESSMENT & PLAN:  Iron deficiency anemia 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 Because of her prior gastric bypass surgery she has significant malabsorption of iron.  Treatment summary: IV iron Given 03/13/2015 Response to IV iron therapy: hemoglobin is 13.7 and iron studies reveal a ferritin of 41. There is no indication for IV iron treatment currently. Patient also had marked improvement in clinical symptoms. Her energy levels have improved. Thalassemia minor  Return to clinic in 3 months for labs and follow-up   No orders of the defined types were placed in this encounter.   The patient has a good understanding of the overall plan. she agrees with it. she will call with any problems that may develop before the next visit here.   Rulon Eisenmenger, MD 07/08/2015

## 2015-07-24 ENCOUNTER — Ambulatory Visit (INDEPENDENT_AMBULATORY_CARE_PROVIDER_SITE_OTHER): Payer: Self-pay | Admitting: Gastroenterology

## 2015-07-24 ENCOUNTER — Encounter: Payer: Self-pay | Admitting: Gastroenterology

## 2015-07-24 VITALS — BP 90/70 | HR 68 | Ht 62.0 in | Wt 222.0 lb

## 2015-07-24 DIAGNOSIS — K625 Hemorrhage of anus and rectum: Secondary | ICD-10-CM

## 2015-07-24 MED ORDER — NA SULFATE-K SULFATE-MG SULF 17.5-3.13-1.6 GM/177ML PO SOLN: 1.0000 | Freq: Once | ORAL | Status: DC

## 2015-07-24 NOTE — Progress Notes (Signed)
Pamela Lewellen    [id number]    1970-10-29  [facility] [address], MD  Referring Physician: Eulas Post, MD [place], [place] [address]  Chief complaint:  Bright red blood per rectum   HPI: 45 year old female status post gastric bypass surgery in 2012, iron deficiency anemia here with complaints of intermittent bright red blood per rectum for past 2 months. She has had 2-3 episodes of small volume blood at the when she wipes or coating the stool in the past few months. She is currently on iron infusion for chronic iron deficiency anemia. She lost a lot of weight after gastric bypass surgery from greater than 450 pounds was down to 160 pounds and now she has gained back some of the weight and is up to 220 pounds. In the interim she also had plastic surgery in her up abdomen, thighs and arms to remove the excess skin/ subcutaneous tissue.     Outpatient Encounter Prescriptions as of 07/24/2015  Medication Sig  . acetaminophen (TYLENOL) 500 MG tablet Take 500 mg by mouth daily as needed for mild pain.  . Calcium Citrate-Vitamin D (CALCIUM CITRATE + PO) Take 600 mg by mouth daily.  . Cholecalciferol (VITAMIN D-3 PO) Take 2,000 Int'l Units by mouth daily.   . furosemide (LASIX) 20 MG tablet Take 1 tablet (20 mg total) by mouth daily as needed for fluid or edema.  Marland Kitchen levothyroxine (SYNTHROID, LEVOTHROID) 125 MCG tablet Take 1 tablet (125 mcg total) by mouth daily.  . Multiple Vitamins-Iron (ONE DAILY MULTIVITAMIN/IRON PO) Take 1 tablet by mouth daily.   . nitroGLYCERIN (NITROSTAT) 0.4 MG SL tablet Place 1 tablet (0.4 mg total) under the tongue every 5 (five) minutes as needed for chest pain.  . pantoprazole (PROTONIX) 40 MG tablet Take 1 tablet (40 mg total) by mouth 2 (two) times daily.   No facility-administered encounter medications on file as of 07/24/2015.    Allergies as of 07/24/2015  . (No Known Allergies)    Past Medical  History  Diagnosis Date  . Chest pain   . Thyroid disease   . GERD (gastroesophageal reflux disease)   . Headache     Past Surgical History  Procedure Laterality Date  . Galbladder    . Umbilical hernia repair    . Bariatric surgery    . Cholecystectomy      Family History  Problem Relation Age of Onset  . Heart disease Father   . Hypertension Father   . Diabetes Father   . Heart attack Mother   . Stroke Neg Hx   . Diabetes Sister     Social History   Social History  . Marital Status: Married    Spouse Name: N/A  . Number of Children: N/A  . Years of Education: N/A   Occupational History  . Not on file.   Social History Main Topics  . Smoking status: Never Smoker   . Smokeless tobacco: Never Used  . Alcohol Use: No  . Drug Use: No  . Sexual Activity: Not on file   Other Topics Concern  . Not on file   Social History Narrative      Review of systems: Review of Systems  Constitutional: Negative for fever and chills.  HENT: Negative.   Eyes: Negative for blurred vision.  Respiratory: Negative for cough, shortness of breath and wheezing.   Cardiovascular: Negative for chest pain and palpitations.  Gastrointestinal:  as per HPI Genitourinary: Negative for dysuria, urgency, frequency and hematuria.  Musculoskeletal: Negative for myalgias, back pain and joint pain.  Skin: Negative for itching and rash.  Neurological: Negative for dizziness, tremors, focal weakness, seizures and loss of consciousness.  Endo/Heme/Allergies: Negative for environmental allergies.  Psychiatric/Behavioral: Negative for depression, suicidal ideas and hallucinations.  All other systems reviewed and are negative.   Physical Exam: Filed Vitals:   07/24/15 1352  BP: 90/70  Pulse: 68   Gen:      No acute distress HEENT:  EOMI, sclera anicteric Neck:     No masses; no thyromegaly Lungs:    Clear to auscultation bilaterally; normal respiratory effort CV:         Regular rate  and rhythm; no murmurs Abd:      + bowel sounds; soft, non-tender; no palpable masses, no distension Ext:    No edema; adequate peripheral perfusion Skin:      Warm and dry; no rash Neuro: alert and oriented x 3 Psych: normal mood and affect  Data Reviewed:  Reviewed chart in epic   Assessment and Plan/Recommendations:  45 year old female s/p gastric bypass surgery 2012, iron deficiency anemia here with c/o intermittent BRBPR Will schedule for colonoscopy for evaluation, likely etiology hemorrhoidal bleeding given intermittent small volume bright red blood but will need to r/o malignancy or other etiology No family h/o colon cancer The risks and benefits as well as alternatives of endoscopic procedure(s) have been discussed and reviewed. All questions answered. The patient agrees to proceed. Return as needed  K. Denzil Magnuson , MD [phone] Mon-Fri 8a-5p [phone] after 5p, weekends, holidays

## 2015-07-24 NOTE — Patient Instructions (Signed)

## 2015-07-26 ENCOUNTER — Ambulatory Visit (INDEPENDENT_AMBULATORY_CARE_PROVIDER_SITE_OTHER): Payer: Self-pay | Admitting: Family Medicine

## 2015-07-26 VITALS — BP 100/72 | HR 68 | Temp 97.9°F | Ht 62.0 in | Wt 223.6 lb

## 2015-07-26 DIAGNOSIS — R5383 Other fatigue: Secondary | ICD-10-CM

## 2015-07-26 DIAGNOSIS — R300 Dysuria: Secondary | ICD-10-CM

## 2015-07-26 DIAGNOSIS — L84 Corns and callosities: Secondary | ICD-10-CM

## 2015-07-26 DIAGNOSIS — E038 Other specified hypothyroidism: Secondary | ICD-10-CM

## 2015-07-26 LAB — POCT URINALYSIS DIPSTICK
Bilirubin, UA: NEGATIVE
Glucose, UA: NEGATIVE
KETONES UA: NEGATIVE
Leukocytes, UA: NEGATIVE
Nitrite, UA: NEGATIVE
PROTEIN UA: NEGATIVE
SPEC GRAV UA: 1.015
Urobilinogen, UA: 0.2
pH, UA: 6.5

## 2015-07-26 LAB — TSH: TSH: 3.89 u[IU]/mL (ref 0.35–4.50)

## 2015-07-26 NOTE — Progress Notes (Signed)
Pre visit review using our clinic review tool, if applicable. No additional management support is needed unless otherwise documented below in the visit note. 

## 2015-07-26 NOTE — Progress Notes (Signed)
   Subjective:    Patient ID: [name], female    DOB: 1971-04-04, 45 y.o.   MRN: [id number]  HPI Here for multiple issues  Acute new problem with 2 day history of urine frequency and burning with urination. Denies any associated fevers. Occasional chills. Increased odor of urine. Slightly cloudy.  Increased fatigue. Hypothyroidism. Overdue for repeat TSH. She has chronic anemia with history of thalassemia and also hx of gastric bypass with poor iron absorption. Recent hgb 7.6 but iron studies are improving  Pain with ambulation. She has history of hard corn/callus under her right third toe. This has recurred. She is requesting trimming today.  Past Medical History  Diagnosis Date  . Chest pain   . Thyroid disease   . GERD (gastroesophageal reflux disease)   . Headache    Past Surgical History  Procedure Laterality Date  . Galbladder    . Umbilical hernia repair    . Bariatric surgery    . Cholecystectomy      reports that she has never smoked. She has never used smokeless tobacco. She reports that she does not drink alcohol or use illicit drugs. family history includes Diabetes in her father and sister; Heart attack in her mother; Heart disease in her father; Hypertension in her father. There is no history of Stroke. No Known Allergies    Review of Systems  Constitutional: Positive for chills and fatigue. Negative for fever.  Respiratory: Negative for cough and shortness of breath.   Cardiovascular: Negative for chest pain.  Gastrointestinal: Negative for abdominal pain.  Endocrine: Negative for polydipsia and polyuria.  Genitourinary: Positive for dysuria and frequency.  Neurological: Negative for dizziness.       Objective:   Physical Exam  Constitutional: She appears well-developed and well-nourished.  Cardiovascular: Normal rate and regular rhythm.   Pulmonary/Chest: Effort normal and breath sounds normal. No respiratory distress. She has no wheezes. She  has no rales.  Musculoskeletal: She exhibits no edema.  Skin:  Small callus under right third toe.          Assessment & Plan:  #1  Dysuria.  Urine dip normal.  Stay well hydrated  #2 Hypothyroidism.  Repeat TSH.   #3 callus right foot  Trimmed with #15 blade and pt noted some immediate relief.

## 2015-07-28 ENCOUNTER — Encounter: Payer: Self-pay | Admitting: Family Medicine

## 2015-08-01 ENCOUNTER — Encounter: Payer: Self-pay | Admitting: Family Medicine

## 2015-08-02 ENCOUNTER — Other Ambulatory Visit: Payer: Self-pay | Admitting: Family Medicine

## 2015-08-02 MED ORDER — LEVOTHYROXINE SODIUM 125 MCG PO TABS: 125.0000 ug | ORAL_TABLET | Freq: Every day | ORAL | Status: DC

## 2015-08-13 ENCOUNTER — Other Ambulatory Visit: Payer: Self-pay | Admitting: Family Medicine

## 2015-08-13 ENCOUNTER — Encounter: Payer: Self-pay | Admitting: Family Medicine

## 2015-08-13 MED ORDER — LEVOTHYROXINE SODIUM 125 MCG PO TABS: 125.0000 ug | ORAL_TABLET | Freq: Every day | ORAL | Status: DC

## 2015-08-20 ENCOUNTER — Ambulatory Visit (AMBULATORY_SURGERY_CENTER): Payer: Self-pay | Admitting: Gastroenterology

## 2015-08-20 ENCOUNTER — Encounter: Payer: Self-pay | Admitting: Gastroenterology

## 2015-08-20 VITALS — BP 102/69 | HR 58 | Temp 99.1°F | Resp 12 | Ht 62.0 in | Wt 222.0 lb

## 2015-08-20 DIAGNOSIS — D128 Benign neoplasm of rectum: Secondary | ICD-10-CM

## 2015-08-20 DIAGNOSIS — K621 Rectal polyp: Secondary | ICD-10-CM

## 2015-08-20 DIAGNOSIS — K625 Hemorrhage of anus and rectum: Secondary | ICD-10-CM

## 2015-08-20 MED ORDER — SODIUM CHLORIDE 0.9 % IV SOLN: 500.0000 mL | INTRAVENOUS | Status: DC

## 2015-08-20 NOTE — Patient Instructions (Signed)
YOU HAD AN ENDOSCOPIC PROCEDURE TODAY AT THE [facility]:   Refer to the procedure report that was given to you for any specific questions about what was found during the examination.  If the procedure report does not answer your questions, please call your gastroenterologist to clarify.  If you requested that your care partner not be given the details of your procedure findings, then the procedure report has been included in a sealed envelope for you to review at your convenience later.  YOU SHOULD EXPECT: Some feelings of bloating in the abdomen. Passage of more gas than usual.  Walking can help get rid of the air that was put into your GI tract during the procedure and reduce the bloating. If you had a lower endoscopy (such as a colonoscopy or flexible sigmoidoscopy) you may notice spotting of blood in your stool or on the toilet paper. If you underwent a bowel prep for your procedure, you may not have a normal bowel movement for a few days.  Please Note:  You might notice some irritation and congestion in your nose or some drainage.  This is from the oxygen used during your procedure.  There is no need for concern and it should clear up in a day or so.  SYMPTOMS TO REPORT IMMEDIATELY:   Following lower endoscopy (colonoscopy or flexible sigmoidoscopy):  Excessive amounts of blood in the stool  Significant tenderness or worsening of abdominal pains  Swelling of the abdomen that is new, acute  Fever of 100F or higher   For urgent or emergent issues, a gastroenterologist can be reached at any hour by calling [phone].   DIET: Your first meal following the procedure should be a small meal and then it is ok to progress to your normal diet. Heavy or fried foods are harder to digest and may make you feel nauseous or bloated.  Likewise, meals heavy in dairy and vegetables can increase bloating.  Drink plenty of fluids but you should avoid alcoholic beverages for 24  hours.  ACTIVITY:  You should plan to take it easy for the rest of today and you should NOT DRIVE or use heavy machinery until tomorrow (because of the sedation medicines used during the test).    FOLLOW UP: Our staff will call the number listed on your records the next business day following your procedure to check on you and address any questions or concerns that you may have regarding the information given to you following your procedure. If we do not reach you, we will leave a message.  However, if you are feeling well and you are not experiencing any problems, there is no need to return our call.  We will assume that you have returned to your regular daily activities without incident.  If any biopsies were taken you will be contacted by phone or by letter within the next 1-3 weeks.  Please call us at [phone] if you have not heard about the biopsies in 3 weeks.    SIGNATURES/CONFIDENTIALITY: You and/or your care partner have signed paperwork which will be entered into your electronic medical record.  These signatures attest to the fact that that the information above on your After Visit Summary has been reviewed and is understood.  Full responsibility of the confidentiality of this discharge information lies with you and/or your care-partner.    Resume medications. Information given on polyps. 

## 2015-08-20 NOTE — Progress Notes (Signed)
Patient awakening,vss,report to rn 

## 2015-08-20 NOTE — Progress Notes (Signed)
Called to room to assist during endoscopic procedure.  Patient ID and intended procedure confirmed with present staff. Received instructions for my participation in the procedure from the performing physician.  

## 2015-08-20 NOTE — Op Note (Signed)
[facility] Patient Name: [name] Procedure Date: 08/20/2015 3:52 PM MRN: [id number] Endoscopist: Mauri Pole , MD Age: 45 Date of Birth: December 08, 1970 Gender: Female Procedure:                Colonoscopy Indications:              Rectal bleeding Medicines:                Monitored Anesthesia Care Procedure:                Pre-Anesthesia Assessment:                           - Prior to the procedure, a History and Physical                            was performed, and patient medications and                            allergies were reviewed. The patient's tolerance of                            previous anesthesia was also reviewed. The risks                            and benefits of the procedure and the sedation                            options and risks were discussed with the patient.                            All questions were answered, and informed consent                            was obtained. Prior Anticoagulants: The patient has                            taken no previous anticoagulant or antiplatelet                            agents. ASA Grade Assessment: II - A patient with                            mild systemic disease. After reviewing the risks                            and benefits, the patient was deemed in                            satisfactory condition to undergo the procedure.                           After obtaining informed consent, the colonoscope  was passed under direct vision. Throughout the                            procedure, the patient's blood pressure, pulse, and                            oxygen saturations were monitored continuously. The                            Model CF-HQ190L [id number]) scope was introduced                            through the anus and advanced to the the terminal                            ileum, with identification of the appendiceal   orifice and IC valve. The colonoscopy was performed                            without difficulty. The patient tolerated the                            procedure well. The quality of the bowel                            preparation was good. The terminal ileum, ileocecal                            valve, appendiceal orifice, and rectum were                            photographed. Scope In: 4:03:19 PM Scope Out: 4:21:51 PM Scope Withdrawal Time: 0 hours 11 minutes 42 seconds  Total Procedure Duration: 0 hours 18 minutes 32 seconds  Findings:                 The perianal and digital rectal examinations were                            normal.                           A 14 mm polyp was found in the rectum with                            ulcerated mucosa. The polyp was semi-pedunculated.                            The polyp was removed with a hot snare. Resection                            and retrieval were complete. To prevent bleeding                            post-intervention, two hemostatic clips were  successfully placed. There was no bleeding at the                            end of the procedure.                           The exam was otherwise without abnormality. Complications:            No immediate complications. Estimated Blood Loss:     Estimated blood loss was minimal. Impression:               - One 14 mm polyp in the rectum with ulcerated                            mucosa likely source of rectal bleeding, removed                            with a hot snare. Resected and retrieved. Clips                            were placed.                           - The examination was otherwise normal. Recommendation:           - Patient has a contact number available for                            emergencies. The signs and symptoms of potential                            delayed complications were discussed with the                            patient.  Return to normal activities tomorrow.                            Written discharge instructions were provided to the                            patient.                           - Resume previous diet.                           - Continue present medications.                           - Await pathology results.                           - Repeat colonoscopy is recommended for                            surveillance. The colonoscopy date will be  determined after pathology results from today's                            exam become available for review.                           - Return to GI clinic PRN. Mauri Pole, MD 08/20/2015 4:31:21 PM This report has been signed electronically.

## 2015-08-21 ENCOUNTER — Telehealth: Payer: Self-pay

## 2015-08-21 NOTE — Telephone Encounter (Signed)
  Follow up Call-  Call back number 08/20/2015  Post procedure Call Back phone  # [phone]  Permission to leave phone message Yes     Patient questions:  Do you have a fever, pain , or abdominal swelling? No. Pain Score  0 *  Have you tolerated food without any problems? Yes.    Have you been able to return to your normal activities? Yes.    Do you have any questions about your discharge instructions: Diet   No. Medications  No. Follow up visit  No.  Do you have questions or concerns about your Care? No.  Actions: * If pain score is 4 or above: No action needed, pain <4.

## 2015-08-28 ENCOUNTER — Encounter: Payer: Self-pay | Admitting: Gastroenterology

## 2015-09-26 ENCOUNTER — Telehealth: Payer: Self-pay | Admitting: Hematology and Oncology

## 2015-09-26 NOTE — Telephone Encounter (Signed)
pt called to r/s appt...done....pt ok and aware of new d.t °

## 2015-09-30 ENCOUNTER — Other Ambulatory Visit: Payer: Self-pay | Admitting: *Deleted

## 2015-09-30 DIAGNOSIS — D509 Iron deficiency anemia, unspecified: Secondary | ICD-10-CM

## 2015-10-02 ENCOUNTER — Other Ambulatory Visit: Payer: Self-pay

## 2015-10-02 ENCOUNTER — Telehealth: Payer: Self-pay | Admitting: Hematology and Oncology

## 2015-10-02 NOTE — Telephone Encounter (Signed)
sw pt and r/s appt ....pt ok and aware of new d.t °

## 2015-10-08 ENCOUNTER — Telehealth: Payer: Self-pay | Admitting: Hematology and Oncology

## 2015-10-08 ENCOUNTER — Other Ambulatory Visit: Payer: Self-pay

## 2015-10-08 NOTE — Telephone Encounter (Signed)
pt called to confirm appt....pt ok and aware of appts

## 2015-10-09 ENCOUNTER — Ambulatory Visit: Payer: Self-pay | Admitting: Hematology and Oncology

## 2015-10-11 ENCOUNTER — Other Ambulatory Visit (HOSPITAL_BASED_OUTPATIENT_CLINIC_OR_DEPARTMENT_OTHER): Payer: Self-pay

## 2015-10-11 DIAGNOSIS — D509 Iron deficiency anemia, unspecified: Secondary | ICD-10-CM

## 2015-10-11 LAB — CBC WITH DIFFERENTIAL/PLATELET
BASO%: 0.3 % (ref 0.0–2.0)
Basophils Absolute: 0 10*3/uL (ref 0.0–0.1)
EOS%: 1.5 % (ref 0.0–7.0)
Eosinophils Absolute: 0.2 10*3/uL (ref 0.0–0.5)
HEMATOCRIT: 36 % (ref 34.8–46.6)
HGB: 11 g/dL — ABNORMAL LOW (ref 11.6–15.9)
LYMPH%: 35.3 % (ref 14.0–49.7)
MCH: 18.4 pg — AB (ref 25.1–34.0)
MCHC: 30.6 g/dL — AB (ref 31.5–36.0)
MCV: 60.2 fL — AB (ref 79.5–101.0)
MONO#: 0.4 10*3/uL (ref 0.1–0.9)
MONO%: 2.8 % (ref 0.0–14.0)
NEUT#: 7.4 10*3/uL — ABNORMAL HIGH (ref 1.5–6.5)
NEUT%: 60.1 % (ref 38.4–76.8)
PLATELETS: 225 10*3/uL (ref 145–400)
RBC: 5.98 10*6/uL — ABNORMAL HIGH (ref 3.70–5.45)
RDW: 15.8 % — ABNORMAL HIGH (ref 11.2–14.5)
WBC: 12.3 10*3/uL — ABNORMAL HIGH (ref 3.9–10.3)
lymph#: 4.4 10*3/uL — ABNORMAL HIGH (ref 0.9–3.3)
nRBC: 0 % (ref 0–0)

## 2015-10-14 LAB — IRON AND TIBC
%SAT: 7 % — ABNORMAL LOW (ref 21–57)
Iron: 22 ug/dL — ABNORMAL LOW (ref 41–142)
TIBC: 311 ug/dL (ref 236–444)
UIBC: 289 ug/dL (ref 120–384)

## 2015-10-14 LAB — FERRITIN: Ferritin: 28 ng/ml (ref 9–269)

## 2015-10-30 ENCOUNTER — Ambulatory Visit: Payer: Self-pay | Admitting: Hematology and Oncology

## 2015-11-08 ENCOUNTER — Encounter: Payer: Self-pay | Admitting: Hematology and Oncology

## 2015-11-08 ENCOUNTER — Ambulatory Visit (HOSPITAL_BASED_OUTPATIENT_CLINIC_OR_DEPARTMENT_OTHER): Payer: Self-pay | Admitting: Hematology and Oncology

## 2015-11-08 ENCOUNTER — Telehealth: Payer: Self-pay | Admitting: Hematology and Oncology

## 2015-11-08 ENCOUNTER — Ambulatory Visit (HOSPITAL_BASED_OUTPATIENT_CLINIC_OR_DEPARTMENT_OTHER): Payer: Self-pay

## 2015-11-08 VITALS — BP 109/58 | HR 63 | Temp 98.2°F | Resp 18

## 2015-11-08 VITALS — BP 105/59 | HR 69 | Temp 99.3°F | Resp 18 | Wt 229.6 lb

## 2015-11-08 DIAGNOSIS — K9089 Other intestinal malabsorption: Secondary | ICD-10-CM

## 2015-11-08 DIAGNOSIS — Z9884 Bariatric surgery status: Secondary | ICD-10-CM

## 2015-11-08 DIAGNOSIS — D509 Iron deficiency anemia, unspecified: Secondary | ICD-10-CM

## 2015-11-08 MED ORDER — FERUMOXYTOL INJECTION 510 MG/17 ML
510.0000 mg | Freq: Once | INTRAVENOUS | Status: AC
  Administered 2015-11-08: 510 mg via INTRAVENOUS
  Filled 2015-11-08: qty 17

## 2015-11-08 MED ORDER — SODIUM CHLORIDE 0.9 % IV SOLN
Freq: Once | INTRAVENOUS | Status: AC
  Administered 2015-11-08: 11:00:00 via INTRAVENOUS

## 2015-11-08 NOTE — Progress Notes (Signed)
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 Minimal exertion  INTERVAL HISTORY: [name] is a 45 year old with above-mentioned history of iron deficiency anemia due to malabsorption from prior gastric bypass surgery is here for a follow-up and reports that she started to have more fatigue issues along with shortness of breath with minimal exertion. She is planning to go out of country for a month in about 10 days. She does have diarrhea.  REVIEW OF SYSTEMS:   Constitutional: Denies fevers, chills or abnormal weight loss Eyes: Denies blurriness of vision Ears, nose, mouth, throat, and face: Denies mucositis or sore throat Respiratory: Shortness of breath and dizziness Cardiovascular: Denies palpitation, chest discomfort Gastrointestinal:  Denies nausea, heartburn or change in bowel habits Skin: Denies abnormal skin rashes Lymphatics: Denies new lymphadenopathy or easy bruising Neurological:Denies numbness, tingling or new weaknesses Behavioral/Psych: Mood is stable, no new changes  Extremities: No lower extremity edema  All other systems were reviewed with the patient and are negative.  I have reviewed the past medical history, past surgical history, social history and family history with the patient and they are unchanged from previous note.  ALLERGIES:  has No Known Allergies.  MEDICATIONS:  Current Outpatient Prescriptions  Medication Sig Dispense Refill  . acetaminophen (TYLENOL) 500 MG tablet Take 500 mg by mouth daily as needed for mild pain.    . Calcium Citrate-Vitamin D (CALCIUM CITRATE + PO) Take 600 mg by mouth daily.    . Cholecalciferol (VITAMIN D-3 PO) Take 2,000 Int'l Units by mouth daily.     . furosemide (LASIX) 20 MG tablet Take 1 tablet (20 mg total) by mouth daily as needed for fluid or  edema. 90 tablet 3  . levothyroxine (SYNTHROID, LEVOTHROID) 125 MCG tablet Take 1 tablet (125 mcg total) by mouth daily. 90 tablet 3  . Multiple Vitamins-Iron (ONE DAILY MULTIVITAMIN/IRON PO) Take 1 tablet by mouth daily.     . nitroGLYCERIN (NITROSTAT) 0.4 MG SL tablet Place 1 tablet (0.4 mg total) under the tongue every 5 (five) minutes as needed for chest pain. 25 tablet 3  . pantoprazole (PROTONIX) 40 MG tablet Take 1 tablet (40 mg total) by mouth 2 (two) times daily. 180 tablet 1   No current facility-administered medications for this visit.    PHYSICAL EXAMINATION: ECOG PERFORMANCE STATUS: 1 - Symptomatic but completely ambulatory  Filed Vitals:   11/08/15 1143  BP: 105/59  Pulse: 69  Temp: 99.3 F (37.4 C)  Resp: 18   Filed Weights   11/08/15 1143  Weight: 229 lb 9.6 oz (104.146 kg)    GENERAL:alert, no distress and comfortable SKIN: skin color, texture, turgor are normal, no rashes or significant lesions EYES: normal, Conjunctiva are pink and non-injected, sclera clear OROPHARYNX:no exudate, no erythema and lips, buccal mucosa, and tongue normal  NECK: supple, thyroid normal size, non-tender, without nodularity LYMPH:  no palpable lymphadenopathy in the cervical, axillary or inguinal LUNGS: clear to auscultation and percussion with normal breathing effort HEART: regular rate & rhythm and no murmurs and no lower extremity edema ABDOMEN:abdomen soft, non-tender and normal bowel sounds MUSCULOSKELETAL:no cyanosis of digits and no clubbing  NEURO: alert & oriented x 3 with fluent speech, no focal motor/sensory deficits EXTREMITIES: No lower extremity edema  LABORATORY DATA:  I have reviewed the data as listed   Chemistry  Component Value Date/Time   NA 137 06/28/2014 1223   K 3.5 06/28/2014 1223   CL 105 06/28/2014 1223   CO2 30 06/28/2014 1223   BUN 7 06/28/2014 1223   CREATININE 0.47 06/28/2014 1223      Component Value Date/Time   CALCIUM 8.3* 06/28/2014  1223   ALKPHOS 89 06/28/2014 1223   AST 15 06/28/2014 1223   ALT 11 06/28/2014 1223   BILITOT 0.3 06/28/2014 1223       Lab Results  Component Value Date   WBC 12.3* 10/11/2015   HGB 11.0* 10/11/2015   HCT 36.0 10/11/2015   MCV 60.2* 10/11/2015   PLT 225 10/11/2015   NEUTROABS 7.4* 10/11/2015   ASSESSMENT & PLAN:  Iron deficiency anemia Severe iron deficiency anemia due to malabsorption In the presence of a prior history of thalassemia minor  I discussed with her the process of iron absorption the intestinal system. Because of her prior gastric bypass surgery she has significant malabsorption of iron.  Treatment summary: IV iron Given 03/13/2015 Response to IV iron therapy: hemoglobin is 11 and iron studies reveal iron saturation of 7%. I recommended that she get IV iron treatment because she has profound fatigue, shortness of breath to exertion, dizziness.  Patient is also leaving out of country in 10 days so we will plan to give HER-2 doses of IV iron prior to that.  Patient previously had marked improvement in clinical symptoms. Her energy levels have improved after the prior iron infusion.  Thalassemia minor: Observation Return to clinic in 3 months with labs done week ahead of time in follow-up.  Orders Placed This Encounter  Procedures  . CBC with Differential    Standing Status: Future     Number of Occurrences:      Standing Expiration Date: 11/07/2016  . Ferritin    Standing Status: Future     Number of Occurrences:      Standing Expiration Date: 11/07/2016  . Iron and TIBC    Standing Status: Future     Number of Occurrences:      Standing Expiration Date: 11/07/2016   The patient has a good understanding of the overall plan. she agrees with it. she will call with any problems that may develop before the next visit here.   Rulon Eisenmenger, MD 11/08/2015

## 2015-11-08 NOTE — Patient Instructions (Signed)

## 2015-11-08 NOTE — Progress Notes (Signed)
Patient came in needing a copy of her financial assistance approval letter. She states she requested twice and had not received. She also state she had her father call. Reviewed account and shows no balance. Called Customer Service(Jessica)@[phone] with patient present to request letter be mailed again. Patient verified info with customer service and authorized to send. Arrived patient for appointment with pager and asked her to stop by when complete so that I could advise her that the letter would be mailed. Patient verbalized understanding.

## 2015-11-08 NOTE — Progress Notes (Signed)
Patient came by and I advised her on follow up and if she has any additional questions or concerns to contact me and if she receives bills that she does not understand she may bring them in on her next visit.

## 2015-11-08 NOTE — Progress Notes (Signed)
Pt tolerated feraheme infusion well. Pt monitored 30 minutes post infusion. Pt and VS in stable condition at time of discharge.

## 2015-11-08 NOTE — Telephone Encounter (Signed)
appt made per pof and avs to be prited in treatment room

## 2015-11-08 NOTE — Assessment & Plan Note (Signed)
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 Because of [name] prior gastric bypass surgery she has significant malabsorption of iron.  Treatment summary: IV iron Given 03/13/2015 Response to IV iron therapy: hemoglobin is 13.7 and iron studies reveal a ferritin of 41. There is no indication for IV iron treatment currently. Patient also had marked improvement in clinical symptoms. [name] energy levels have improved after the prior iron infusion.  Thalassemia minor: Observation

## 2015-11-11 ENCOUNTER — Other Ambulatory Visit: Payer: Self-pay | Admitting: *Deleted

## 2015-11-11 MED ORDER — FUROSEMIDE 20 MG PO TABS: 20.0000 mg | ORAL_TABLET | Freq: Every day | ORAL | Status: DC | PRN

## 2015-11-15 ENCOUNTER — Ambulatory Visit (HOSPITAL_BASED_OUTPATIENT_CLINIC_OR_DEPARTMENT_OTHER): Payer: Self-pay

## 2015-11-15 VITALS — BP 100/63 | HR 68 | Temp 98.3°F | Resp 18

## 2015-11-15 DIAGNOSIS — K9089 Other intestinal malabsorption: Secondary | ICD-10-CM

## 2015-11-15 DIAGNOSIS — D509 Iron deficiency anemia, unspecified: Secondary | ICD-10-CM

## 2015-11-15 MED ORDER — SODIUM CHLORIDE 0.9 % IV SOLN
510.0000 mg | Freq: Once | INTRAVENOUS | Status: AC
Start: 2015-11-15 — End: 2015-11-15
  Administered 2015-11-15: 510 mg via INTRAVENOUS
  Filled 2015-11-15: qty 17

## 2015-11-15 MED ORDER — SODIUM CHLORIDE 0.9 % IV SOLN
Freq: Once | INTRAVENOUS | Status: AC
  Administered 2015-11-15: 13:00:00 via INTRAVENOUS

## 2015-11-15 NOTE — Patient Instructions (Signed)

## 2015-11-21 ENCOUNTER — Telehealth: Payer: Self-pay | Admitting: Cardiovascular Disease

## 2015-11-21 ENCOUNTER — Encounter: Payer: Self-pay | Admitting: Cardiovascular Disease

## 2015-11-21 NOTE — Telephone Encounter (Signed)
Closed enocunter °

## 2015-12-10 ENCOUNTER — Ambulatory Visit: Payer: Self-pay | Admitting: Student

## 2015-12-25 ENCOUNTER — Ambulatory Visit (INDEPENDENT_AMBULATORY_CARE_PROVIDER_SITE_OTHER): Payer: Self-pay | Admitting: Cardiology

## 2015-12-25 ENCOUNTER — Encounter: Payer: Self-pay | Admitting: Cardiology

## 2015-12-25 DIAGNOSIS — R079 Chest pain, unspecified: Secondary | ICD-10-CM

## 2015-12-25 DIAGNOSIS — D563 Thalassemia minor: Secondary | ICD-10-CM

## 2015-12-25 DIAGNOSIS — Z9189 Other specified personal risk factors, not elsewhere classified: Secondary | ICD-10-CM

## 2015-12-25 DIAGNOSIS — E662 Morbid (severe) obesity with alveolar hypoventilation: Secondary | ICD-10-CM

## 2015-12-25 NOTE — Progress Notes (Signed)
12/25/2015 [name]   Feb 17, 1971  [id number]  Primary Physician Rachael Post, MD Primary Cardiologist: Dr [name] (formerlly Dr [name])  HPI:  45 year old woman is seen for a office visit. She was previously followed by Dr [name] and is scheduled to be established with Dr [name] who also sees her father.She has an interesting past history. At one time she weighed almost on 370 pounds. She underwent a gastric bypass at [facility] by Dr. Volanda [name] and her weight is down approximately 150 pounds from its peak. The patient was a diabetic prior to her gastric bypass and had sleep apnea on C-pap. When she had the gastric bypass her blood sugars normalized almost immediately and she stopped using CV-pap. The patient also has a history of hypothyroidism and is on Synthroid.        She was admitted in January 2015 for chest pain and had a Myoview stress test which was normal, her ejection fraction was 69%. Echocardiogram on 05/23/13 showed normal left ventricular systolic and normal diastolic function and no wall motion or valvular abnormalities.          She is seeing me today because her financial aid is expiring. She has no insurance. She tells me she had chest pain a few days ago while cooking. She says she got "dizzy" then had "sharp" mid chest pain followed by Rt arm pain. He symptoms lasted less than 5 minutes. She has not had recurrence.    Current Outpatient Prescriptions  Medication Sig Dispense Refill  . acetaminophen (TYLENOL) 500 MG tablet Take 500 mg by mouth daily as needed for mild pain.    . Calcium Citrate-Vitamin D (CALCIUM CITRATE + PO) Take 600 mg by mouth daily.    . Cholecalciferol (VITAMIN D-3 PO) Take 2,000 Int'l Units by mouth daily.     . furosemide (LASIX) 20 MG tablet Take 1 tablet (20 mg total) by mouth daily as needed for fluid or edema. 90 tablet 3  . levothyroxine (SYNTHROID, LEVOTHROID) 125 MCG tablet Take 1 tablet (125 mcg total) by mouth daily.  90 tablet 3  . Multiple Vitamins-Iron (ONE DAILY MULTIVITAMIN/IRON PO) Take 1 tablet by mouth daily.     . nitroGLYCERIN (NITROSTAT) 0.4 MG SL tablet Place 1 tablet (0.4 mg total) under the tongue every 5 (five) minutes as needed for chest pain. 25 tablet 3  . pantoprazole (PROTONIX) 40 MG tablet Take 1 tablet (40 mg total) by mouth 2 (two) times daily. 180 tablet 1   No current facility-administered medications for this visit.     No Known Allergies  Social History   Social History  . Marital status: Married    Spouse name: N/A  . Number of children: N/A  . Years of education: N/A   Occupational History  . Not on file.   Social History Main Topics  . Smoking status: Never Smoker  . Smokeless tobacco: Never Used  . Alcohol use No  . Drug use: No  . Sexual activity: Not on file   Other Topics Concern  . Not on file   Social History Narrative  . No narrative on file     Review of Systems: General: negative for chills, fever, night sweats or weight changes.  Cardiovascular: negative for chest pain, dyspnea on exertion, edema, orthopnea, palpitations, paroxysmal nocturnal dyspnea or shortness of breath Dermatological: negative for rash Respiratory: negative for cough or wheezing Urologic: negative for hematuria Abdominal: negative for nausea, vomiting, diarrhea, bright red blood per  rectum, melena, or hematemesis Neurologic: negative for visual changes, syncope, or dizziness All other systems reviewed and are otherwise negative except as noted above.    Blood pressure 140/82, pulse 67, height 5\' 2"  (1.575 m), weight 226 lb 9.6 oz (102.8 kg).  General appearance: alert, cooperative, no distress and moderately obese Neck: no carotid bruit and no JVD Lungs: clear to auscultation bilaterally Heart: regular rate and rhythm Extremities: trace edema Skin: Skin color, texture, turgor normal. No rashes or lesions Neuro: grossly intact   ASSESSMENT AND PLAN:   Chest pain  with minimal risk of acute coronary syndrome Pt says she had "sharp" chest pain with radiation to her Rt arm and dizziness, lasted about 5 minutes.  Obesity hypoventilation syndrome-n C-pap till she had GBP surg Her wgt has been pretty stable last three OV  Thalassemia trait She requires intermittent transfusions, last Hgb 11 in June   PLAN  I suggested she call us if she has another episode of chest pain. We would probably need to repeat her Myoview.   Rachael Ransom PA-C 12/25/2015 4:04 PM

## 2015-12-25 NOTE — Assessment & Plan Note (Signed)
She requires intermittent transfusions, last Hgb 11 in June

## 2015-12-25 NOTE — Assessment & Plan Note (Signed)
Her wgt has been pretty stable last three OV

## 2015-12-25 NOTE — Patient Instructions (Signed)
Your physician wants you to follow-up in: 6 months with Dr. Kelly.You will receive a reminder letter in the mail two months in advance. If you don't receive a letter, please call our office to schedule the follow-up appointment.   If you need a refill on your cardiac medications before your next appointment, please call your pharmacy.  

## 2015-12-25 NOTE — Assessment & Plan Note (Signed)
Pt says she had "sharp" chest pain with radiation to her Rt arm and dizziness, lasted about 5 minutes.

## 2016-01-16 ENCOUNTER — Telehealth: Payer: Self-pay | Admitting: Hematology and Oncology

## 2016-01-16 ENCOUNTER — Ambulatory Visit (HOSPITAL_BASED_OUTPATIENT_CLINIC_OR_DEPARTMENT_OTHER): Payer: Self-pay | Admitting: Hematology and Oncology

## 2016-01-16 ENCOUNTER — Encounter: Payer: Self-pay | Admitting: Hematology and Oncology

## 2016-01-16 ENCOUNTER — Ambulatory Visit (HOSPITAL_BASED_OUTPATIENT_CLINIC_OR_DEPARTMENT_OTHER): Payer: Self-pay

## 2016-01-16 DIAGNOSIS — D509 Iron deficiency anemia, unspecified: Secondary | ICD-10-CM

## 2016-01-16 DIAGNOSIS — K909 Intestinal malabsorption, unspecified: Secondary | ICD-10-CM

## 2016-01-16 DIAGNOSIS — D563 Thalassemia minor: Secondary | ICD-10-CM

## 2016-01-16 LAB — CBC & DIFF AND RETIC
BASO%: 0.5 % (ref 0.0–2.0)
BASOS ABS: 0.1 10*3/uL (ref 0.0–0.1)
EOS%: 1.6 % (ref 0.0–7.0)
Eosinophils Absolute: 0.2 10*3/uL (ref 0.0–0.5)
HCT: 37.5 % (ref 34.8–46.6)
HGB: 11.5 g/dL — ABNORMAL LOW (ref 11.6–15.9)
Immature Retic Fract: 2.5 % (ref 1.60–10.00)
LYMPH#: 3.9 10*3/uL — AB (ref 0.9–3.3)
LYMPH%: 35.5 % (ref 14.0–49.7)
MCH: 18.9 pg — ABNORMAL LOW (ref 25.1–34.0)
MCHC: 30.7 g/dL — AB (ref 31.5–36.0)
MCV: 61.8 fL — ABNORMAL LOW (ref 79.5–101.0)
MONO#: 0.5 10*3/uL (ref 0.1–0.9)
MONO%: 4.7 % (ref 0.0–14.0)
NEUT%: 57.7 % (ref 38.4–76.8)
NEUTROS ABS: 6.3 10*3/uL (ref 1.5–6.5)
Platelets: 236 10*3/uL (ref 145–400)
RBC: 6.07 10*6/uL — AB (ref 3.70–5.45)
RDW: 18.2 % — AB (ref 11.2–14.5)
RETIC %: 0.93 % (ref 0.70–2.10)
RETIC CT ABS: 56.45 10*3/uL (ref 33.70–90.70)
WBC: 10.9 10*3/uL — ABNORMAL HIGH (ref 3.9–10.3)

## 2016-01-16 NOTE — Progress Notes (Signed)
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 She is here for a 55-month follow-up after receiving IV iron treatment. She reports that energy levels have improved but there are still moderately fatigued. She is losing a lot of hair and is very concerned about that.  REVIEW OF SYSTEMS:   Constitutional: Denies fevers, chills or abnormal weight loss Eyes: Denies blurriness of vision Ears, nose, mouth, throat, and face: Denies mucositis or sore throat Respiratory: Denies cough, dyspnea or wheezes Cardiovascular: Denies palpitation, chest discomfort Gastrointestinal:  Denies nausea, heartburn or change in bowel habits Skin: Denies abnormal skin rashes Lymphatics: Denies new lymphadenopathy or easy bruising Neurological:Denies numbness, tingling or new weaknesses Behavioral/Psych: Mood is stable, no new changes  Extremities: No lower extremity edema  All other systems were reviewed with the patient and are negative.  I have reviewed the past medical history, past surgical history, social history and family history with the patient and they are unchanged from previous note.  ALLERGIES:  has No Known Allergies.  MEDICATIONS:  Current Outpatient Prescriptions  Medication Sig Dispense Refill  . acetaminophen (TYLENOL) 500 MG tablet Take 500 mg by mouth daily as needed for mild pain.    . Calcium Citrate-Vitamin D (CALCIUM CITRATE + PO) Take 600 mg by mouth daily.    . Cholecalciferol (VITAMIN D-3 PO) Take 2,000 Int'l Units by mouth daily.     . furosemide (LASIX) 20 MG tablet Take 1 tablet (20  mg total) by mouth daily as needed for fluid or edema. 90 tablet 3  . levothyroxine (SYNTHROID, LEVOTHROID) 125 MCG tablet Take 1 tablet (125 mcg total) by mouth daily. 90 tablet 3  . Multiple Vitamins-Iron (ONE DAILY MULTIVITAMIN/IRON PO) Take 1 tablet by mouth daily.     . nitroGLYCERIN (NITROSTAT) 0.4 MG SL tablet Place 1 tablet (0.4 mg total) under the tongue every 5 (five) minutes as needed for chest pain. 25 tablet 3  . pantoprazole (PROTONIX) 40 MG tablet Take 1 tablet (40 mg total) by mouth 2 (two) times daily. 180 tablet 1   No current facility-administered medications for this visit.     PHYSICAL EXAMINATION: ECOG PERFORMANCE STATUS: 1 - Symptomatic but completely ambulatory  There were no vitals filed for this visit. There were no vitals filed for this visit.  GENERAL:alert, no distress and comfortable SKIN: skin color, texture, turgor are normal, no rashes or significant lesions EYES: normal, Conjunctiva are pink and non-injected, sclera clear OROPHARYNX:no exudate, no erythema and lips, buccal mucosa, and tongue normal  NECK: supple, thyroid normal size, non-tender, without nodularity LYMPH:  no palpable lymphadenopathy in the cervical, axillary or inguinal LUNGS: clear to auscultation and percussion with normal breathing effort HEART: regular rate & rhythm and no murmurs and no lower extremity edema ABDOMEN:abdomen soft, non-tender and normal bowel sounds MUSCULOSKELETAL:no cyanosis of digits and no clubbing  NEURO: alert & oriented x 3 with fluent speech, no focal motor/sensory deficits EXTREMITIES: No lower extremity edema  LABORATORY DATA:  I have reviewed the data as listed   Chemistry      Component Value Date/Time   NA 137 06/28/2014 1223   K 3.5 06/28/2014 1223  CL 105 06/28/2014 1223   CO2 30 06/28/2014 1223   BUN 7 06/28/2014 1223   CREATININE 0.47 06/28/2014 1223      Component Value Date/Time   CALCIUM 8.3 (L) 06/28/2014 1223   ALKPHOS 89 06/28/2014  1223   AST 15 06/28/2014 1223   ALT 11 06/28/2014 1223   BILITOT 0.3 06/28/2014 1223       Lab Results  Component Value Date   WBC 10.9 (H) 01/16/2016   HGB 11.5 (L) 01/16/2016   HCT 37.5 01/16/2016   MCV 61.8 (L) 01/16/2016   PLT 236 01/16/2016   NEUTROABS 6.3 01/16/2016     ASSESSMENT & PLAN:  Iron deficiency anemia Severe iron deficiency anemia due to malabsorption In the presence of a prior history of thalassemia minor  I discussed with her the process of iron absorption the intestinal system. Because of her prior gastric bypass surgery she has significant malabsorption of iron.  Treatment summary: IV iron Given 03/13/2015; 11/08/2015 2 doses Response to IV iron therapy: Patient previously had marked improvement in clinical symptoms. Her energy levels have improved after the prior iron infusion. Today her hemoglobin is 11.5. Iron studies are pending. I will call her with the results of iron studies and see if additional and will need to be given.  Thalassemia minor: Observation  Return to clinic in 3 months with labs done ahead of time   Orders Placed This Encounter  Procedures  . CBC & Diff and Retic    Standing Status:   Standing    Number of Occurrences:   20    Standing Expiration Date:   01/15/2017  . Ferritin    Standing Status:   Standing    Number of Occurrences:   20    Standing Expiration Date:   01/15/2017  . Iron and TIBC    Standing Status:   Standing    Number of Occurrences:   20    Standing Expiration Date:   01/15/2017   The patient has a good understanding of the overall plan. she agrees with it. she will call with any problems that may develop before the next visit here.   Rulon Eisenmenger, MD 01/16/16

## 2016-01-16 NOTE — Telephone Encounter (Signed)
appt made and avs printed °

## 2016-01-16 NOTE — Assessment & Plan Note (Signed)
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 Because of her prior gastric bypass surgery she has significant malabsorption of iron.  Treatment summary: IV iron Given 03/13/2015; 11/08/2015 2 doses Response to IV iron therapy: Patient previously had marked improvement in clinical symptoms. Her energy levels have improved after the prior iron infusion.  Thalassemia minor: Observation  Return to clinic in 3 months with labs done ahead of time

## 2016-01-17 ENCOUNTER — Telehealth: Payer: Self-pay

## 2016-01-17 LAB — IRON AND TIBC
%SAT: 24 % (ref 21–57)
IRON: 61 ug/dL (ref 41–142)
TIBC: 250 ug/dL (ref 236–444)
UIBC: 190 ug/dL (ref 120–384)

## 2016-01-17 LAB — FERRITIN: FERRITIN: 221 ng/mL (ref 9–269)

## 2016-01-17 NOTE — Telephone Encounter (Signed)
Per Dr. Geralyn Flash request, called pt to inform her Dr. Lindi Adie has reviewed her lab work from yesterday and she does not need iron at this time.  Pt verbalized understanding and is without further questions or concerns at time of call.

## 2016-02-06 ENCOUNTER — Ambulatory Visit: Payer: Self-pay | Admitting: Hematology and Oncology

## 2016-04-04 ENCOUNTER — Encounter: Payer: Self-pay | Admitting: Family Medicine

## 2016-04-06 ENCOUNTER — Encounter: Payer: Self-pay | Admitting: Family Medicine

## 2016-04-07 ENCOUNTER — Encounter: Payer: Self-pay | Admitting: Family Medicine

## 2016-04-15 NOTE — Assessment & Plan Note (Signed)
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 Because of her prior gastric bypass surgery she has significant malabsorption of iron.  Treatment summary: IV iron Given 03/13/2015; 11/08/2015 2 doses Response to IV iron therapy: Patient previously had marked improvement in clinical symptoms. Her energy levels have improved after the prior iron infusion. Today her hemoglobin is 11.5. Iron studies are pending. I will call her with the results of iron studies and see if additional and will need to be given.  Thalassemia minor: Observation  Return to clinic in 3 months with labs done ahead of time

## 2016-04-16 ENCOUNTER — Encounter: Payer: Self-pay | Admitting: Hematology and Oncology

## 2016-04-16 ENCOUNTER — Ambulatory Visit (HOSPITAL_BASED_OUTPATIENT_CLINIC_OR_DEPARTMENT_OTHER): Payer: Self-pay | Admitting: Hematology and Oncology

## 2016-04-16 ENCOUNTER — Telehealth: Payer: Self-pay | Admitting: Hematology and Oncology

## 2016-04-16 ENCOUNTER — Other Ambulatory Visit (HOSPITAL_BASED_OUTPATIENT_CLINIC_OR_DEPARTMENT_OTHER): Payer: Self-pay

## 2016-04-16 DIAGNOSIS — D509 Iron deficiency anemia, unspecified: Secondary | ICD-10-CM

## 2016-04-16 DIAGNOSIS — D500 Iron deficiency anemia secondary to blood loss (chronic): Secondary | ICD-10-CM

## 2016-04-16 DIAGNOSIS — D563 Thalassemia minor: Secondary | ICD-10-CM

## 2016-04-16 LAB — CBC WITH DIFFERENTIAL/PLATELET
BASO%: 1.2 % (ref 0.0–2.0)
Basophils Absolute: 0.1 10*3/uL (ref 0.0–0.1)
EOS%: 2.4 % (ref 0.0–7.0)
Eosinophils Absolute: 0.3 10*3/uL (ref 0.0–0.5)
HCT: 41.1 % (ref 34.8–46.6)
HGB: 12.5 g/dL (ref 11.6–15.9)
LYMPH%: 31.5 % (ref 14.0–49.7)
MCH: 18.8 pg — ABNORMAL LOW (ref 25.1–34.0)
MCHC: 30.5 g/dL — AB (ref 31.5–36.0)
MCV: 61.7 fL — ABNORMAL LOW (ref 79.5–101.0)
MONO#: 0.7 10*3/uL (ref 0.1–0.9)
MONO%: 6.5 % (ref 0.0–14.0)
NEUT%: 58.4 % (ref 38.4–76.8)
NEUTROS ABS: 6.3 10*3/uL (ref 1.5–6.5)
Platelets: 222 10*3/uL (ref 145–400)
RBC: 6.67 10*6/uL — AB (ref 3.70–5.45)
RDW: 15.5 % — ABNORMAL HIGH (ref 11.2–14.5)
WBC: 10.9 10*3/uL — AB (ref 3.9–10.3)
lymph#: 3.4 10*3/uL — ABNORMAL HIGH (ref 0.9–3.3)

## 2016-04-16 NOTE — Telephone Encounter (Signed)
Gave patient avs report and appointments for March  °

## 2016-04-16 NOTE — Progress Notes (Signed)
Patient Care Team: Eulas Post, MD as PCP - General (Family Medicine)  DIAGNOSIS:  Encounter Diagnosis  Name Primary?  . Iron deficiency anemia due to chronic blood loss    CHIEF COMPLIANT: Fatigue and cold symptoms and mild dizziness  INTERVAL HISTORY: [name] is a 45 year old with above-mentioned history of recurrent iron deficiency anemia who is currently here for recheck of her blood counts. Today her hemoglobin is 12.5. She is recently had a cold and feels slightly dizzy and congested. Denies any fevers or chills.  REVIEW OF SYSTEMS:   Constitutional: Denies fevers, chills or abnormal weight loss Eyes: Denies blurriness of vision Ears, nose, mouth, throat, and face: Cold symptoms Respiratory: Denies cough, dyspnea or wheezes Cardiovascular: Denies palpitation, chest discomfort Gastrointestinal:  Denies nausea, heartburn or change in bowel habits Skin: Denies abnormal skin rashes Lymphatics: Denies new lymphadenopathy or easy bruising Neurological:Denies numbness, tingling or new weaknesses Behavioral/Psych: Mood is stable, no new changes  Extremities: No lower extremity edema  All other systems were reviewed with the patient and are negative.  I have reviewed the past medical history, past surgical history, social history and family history with the patient and they are unchanged from previous note.  ALLERGIES:  has No Known Allergies.  MEDICATIONS:  Current Outpatient Prescriptions  Medication Sig Dispense Refill  . acetaminophen (TYLENOL) 500 MG tablet Take 500 mg by mouth daily as needed for mild pain.    . Calcium Citrate-Vitamin D (CALCIUM CITRATE + PO) Take 600 mg by mouth daily.    . Cholecalciferol (VITAMIN D-3 PO) Take 2,000 Int'l Units by mouth daily.     . furosemide (LASIX) 20 MG tablet Take 1 tablet (20 mg total) by mouth daily as needed for fluid or edema. 90 tablet 3  . levothyroxine (SYNTHROID, LEVOTHROID) 125 MCG tablet Take 1 tablet (125 mcg  total) by mouth daily. 90 tablet 3  . Multiple Vitamins-Iron (ONE DAILY MULTIVITAMIN/IRON PO) Take 1 tablet by mouth daily.     . nitroGLYCERIN (NITROSTAT) 0.4 MG SL tablet Place 1 tablet (0.4 mg total) under the tongue every 5 (five) minutes as needed for chest pain. 25 tablet 3  . pantoprazole (PROTONIX) 40 MG tablet Take 1 tablet (40 mg total) by mouth 2 (two) times daily. 180 tablet 1   No current facility-administered medications for this visit.     PHYSICAL EXAMINATION: ECOG PERFORMANCE STATUS: 0 - Asymptomatic  Vitals:   04/16/16 1518  BP: 140/77  Pulse: 77  Resp: 18  Temp: 98.9 F (37.2 C)   Filed Weights   04/16/16 1518  Weight: 237 lb 11.2 oz (107.8 kg)    GENERAL:alert, no distress and comfortable SKIN: skin color, texture, turgor are normal, no rashes or significant lesions EYES: normal, Conjunctiva are pink and non-injected, sclera clear OROPHARYNX:no exudate, no erythema and lips, buccal mucosa, and tongue normal  NECK: supple, thyroid normal size, non-tender, without nodularity LYMPH:  no palpable lymphadenopathy in the cervical, axillary or inguinal LUNGS: clear to auscultation and percussion with normal breathing effort HEART: regular rate & rhythm and no murmurs and no lower extremity edema ABDOMEN:abdomen soft, non-tender and normal bowel sounds MUSCULOSKELETAL:no cyanosis of digits and no clubbing  NEURO: alert & oriented x 3 with fluent speech, no focal motor/sensory deficits EXTREMITIES: No lower extremity edema  LABORATORY DATA:  I have reviewed the data as listed   Chemistry      Component Value Date/Time   NA 137 06/28/2014 1223   K 3.5  06/28/2014 1223   CL 105 06/28/2014 1223   CO2 30 06/28/2014 1223   BUN 7 06/28/2014 1223   CREATININE 0.47 06/28/2014 1223      Component Value Date/Time   CALCIUM 8.3 (L) 06/28/2014 1223   ALKPHOS 89 06/28/2014 1223   AST 15 06/28/2014 1223   ALT 11 06/28/2014 1223   BILITOT 0.3 06/28/2014 1223        Lab Results  Component Value Date   WBC 10.9 (H) 04/16/2016   HGB 12.5 04/16/2016   HCT 41.1 04/16/2016   MCV 61.7 (L) 04/16/2016   PLT 222 04/16/2016   NEUTROABS 6.3 04/16/2016    ASSESSMENT & PLAN:  Iron deficiency anemia Severe iron deficiency anemia due to malabsorption In the presence of a prior history of thalassemia minor  I discussed with her the process of iron absorption the intestinal system. Because of her prior gastric bypass surgery she has significant malabsorption of iron.   Treatment summary: IV iron Given 03/13/2015; 11/08/2015 2 doses  Response to IV iron therapy: Patient previously had marked improvement in clinical symptoms. Her energy levels have improved after the prior iron infusion. Today her hemoglobin is 12.5. Iron studies are pending. I will call her with the results of iron studies and see if additional iron will need to be given.  Beta-Thalassemia minor: Observation I encouraged the patient to get her annual mammograms. She will call [facility] and get them. Return to clinic in 3 months with labs done ahead of time  No orders of the defined types were placed in this encounter.  The patient has a good understanding of the overall plan. she agrees with it. she will call with any problems that may develop before the next visit here.   Rulon Eisenmenger, MD 04/16/16

## 2016-04-17 ENCOUNTER — Ambulatory Visit: Payer: Self-pay | Admitting: Family Medicine

## 2016-04-17 LAB — FERRITIN: Ferritin: 206 ng/ml (ref 9–269)

## 2016-04-17 LAB — IRON AND TIBC
%SAT: 13 % — ABNORMAL LOW (ref 21–57)
Iron: 39 ug/dL — ABNORMAL LOW (ref 41–142)
TIBC: 305 ug/dL (ref 236–444)
UIBC: 266 ug/dL (ref 120–384)

## 2016-04-24 ENCOUNTER — Ambulatory Visit (INDEPENDENT_AMBULATORY_CARE_PROVIDER_SITE_OTHER): Payer: Self-pay | Admitting: Family Medicine

## 2016-04-24 VITALS — BP 94/60 | HR 71 | Temp 98.1°F | Ht 62.0 in | Wt 240.2 lb

## 2016-04-24 DIAGNOSIS — D500 Iron deficiency anemia secondary to blood loss (chronic): Secondary | ICD-10-CM

## 2016-04-24 DIAGNOSIS — E039 Hypothyroidism, unspecified: Secondary | ICD-10-CM

## 2016-04-24 DIAGNOSIS — Z87898 Personal history of other specified conditions: Secondary | ICD-10-CM

## 2016-04-24 DIAGNOSIS — E559 Vitamin D deficiency, unspecified: Secondary | ICD-10-CM

## 2016-04-24 DIAGNOSIS — Z23 Encounter for immunization: Secondary | ICD-10-CM

## 2016-04-24 DIAGNOSIS — E1165 Type 2 diabetes mellitus with hyperglycemia: Secondary | ICD-10-CM | POA: Insufficient documentation

## 2016-04-24 DIAGNOSIS — R7303 Prediabetes: Secondary | ICD-10-CM

## 2016-04-24 DIAGNOSIS — R7989 Other specified abnormal findings of blood chemistry: Secondary | ICD-10-CM

## 2016-04-24 LAB — VITAMIN D 25 HYDROXY (VIT D DEFICIENCY, FRACTURES): VITD: 9.5 ng/mL — AB (ref 30.00–100.00)

## 2016-04-24 LAB — HEMOGLOBIN A1C: HEMOGLOBIN A1C: 6.2 % (ref 4.6–6.5)

## 2016-04-24 LAB — TSH: TSH: 3.68 u[IU]/mL (ref 0.35–4.50)

## 2016-04-24 NOTE — Progress Notes (Signed)
Subjective:     Patient ID: [name], female   DOB: [birth year], 45 y.o.   MRN: [id number]  HPI Patient seen for routine medical follow-up. She has history of morbid obesity. She had gastric bypass 2012. Her weight has been up around 450 pounds prior to then. She has gained about 17 pounds last spring. Poor compliance with diet. She is very frustrated with the weight gain. She does have hypothyroidism and TSH was at goal 9 months ago. She is compliant with therapy. She has history of low vitamin D. She has history of anemia with thalassemia as well as probably poor absorption from her gastric surgery. She's been getting iron infusions and recent hemoglobin 12.5. She still needs flu vaccine. Not consistently exercising.  She's had past history of prediabetes. Does not monitor sugars regularly. No polyuria or polydipsia. No recent hemoglobin A1c  Past Medical History:  Diagnosis Date  . Chest pain   . GERD (gastroesophageal reflux disease)   . Headache   . Thyroid disease    Past Surgical History:  Procedure Laterality Date  . BARIATRIC SURGERY    . CHOLECYSTECTOMY    . galbladder    . UMBILICAL HERNIA REPAIR      reports that she has never smoked. She has never used smokeless tobacco. She reports that she does not drink alcohol or use drugs. family history includes Diabetes in her father and sister; Heart attack in her mother; Heart disease in her father; Hypertension in her father. Allergies  Allergen Reactions  . Augmentin [Amoxicillin-Pot Clavulanate] Shortness Of Breath     Review of Systems  Constitutional: Positive for fatigue and unexpected weight change.  Eyes: Negative for visual disturbance.  Respiratory: Negative for cough, chest tightness, shortness of breath and wheezing.   Cardiovascular: Negative for chest pain, palpitations and leg swelling.  Gastrointestinal: Negative for abdominal pain, nausea and vomiting.  Endocrine: Negative for polydipsia and polyuria.   Genitourinary: Negative for dysuria.  Neurological: Negative for dizziness, seizures, syncope, weakness, light-headedness and headaches.       Objective:   Physical Exam  Constitutional: She appears well-developed and well-nourished.  HENT:  Mouth/Throat: Oropharynx is clear and moist.  Cardiovascular: Normal rate.   Pulmonary/Chest: Effort normal and breath sounds normal. No respiratory distress. She has no wheezes. She has no rales.  Abdominal: Soft. Bowel sounds are normal. She exhibits no distension and no mass. There is no tenderness. There is no rebound and no guarding.  Musculoskeletal: She exhibits no edema.       Assessment:     #1 obesity. History of gastric bypass  #2 hypothyroidism  #3 history of anemia-combined thalassemia and poor iron absorption  #4 history of prediabetes    Plan:     -Check TSH, hemoglobin A1c, 25-hydroxy vitamin D -We discussed possible referral to nutritionist but she is concerned about coverage with her lack of insurance -Flu vaccine given -Routine follow-up 6 months  Eulas Post MD [facility]

## 2016-04-24 NOTE — Progress Notes (Signed)
Pre visit review using our clinic review tool, if applicable. No additional management support is needed unless otherwise documented below in the visit note. 

## 2016-04-28 ENCOUNTER — Other Ambulatory Visit: Payer: Self-pay

## 2016-04-28 DIAGNOSIS — E559 Vitamin D deficiency, unspecified: Secondary | ICD-10-CM

## 2016-04-28 MED ORDER — VITAMIN D (ERGOCALCIFEROL) 1.25 MG (50000 UNIT) PO CAPS: 50000.0000 [IU] | ORAL_CAPSULE | ORAL | 2 refills | Status: DC

## 2016-06-25 ENCOUNTER — Ambulatory Visit: Payer: Self-pay | Admitting: Cardiovascular Disease

## 2016-07-16 ENCOUNTER — Other Ambulatory Visit (HOSPITAL_BASED_OUTPATIENT_CLINIC_OR_DEPARTMENT_OTHER): Payer: Self-pay

## 2016-07-16 DIAGNOSIS — D509 Iron deficiency anemia, unspecified: Secondary | ICD-10-CM

## 2016-07-16 LAB — CBC & DIFF AND RETIC
BASO%: 0.4 % (ref 0.0–2.0)
Basophils Absolute: 0.1 10*3/uL (ref 0.0–0.1)
EOS%: 1.4 % (ref 0.0–7.0)
Eosinophils Absolute: 0.2 10*3/uL (ref 0.0–0.5)
HCT: 38.8 % (ref 34.8–46.6)
HGB: 11.9 g/dL (ref 11.6–15.9)
Immature Retic Fract: 5.5 % (ref 1.60–10.00)
LYMPH%: 23.6 % (ref 14.0–49.7)
MCH: 19.3 pg — AB (ref 25.1–34.0)
MCHC: 30.7 g/dL — AB (ref 31.5–36.0)
MCV: 63.1 fL — ABNORMAL LOW (ref 79.5–101.0)
MONO#: 0.5 10*3/uL (ref 0.1–0.9)
MONO%: 3.3 % (ref 0.0–14.0)
NEUT%: 71.3 % (ref 38.4–76.8)
NEUTROS ABS: 9.9 10*3/uL — AB (ref 1.5–6.5)
Platelets: 224 10*3/uL (ref 145–400)
RBC: 6.15 10*6/uL — AB (ref 3.70–5.45)
RDW: 15.5 % — ABNORMAL HIGH (ref 11.2–14.5)
RETIC %: 1.18 % (ref 0.70–2.10)
Retic Ct Abs: 72.57 10*3/uL (ref 33.70–90.70)
WBC: 13.9 10*3/uL — AB (ref 3.9–10.3)
lymph#: 3.3 10*3/uL (ref 0.9–3.3)

## 2016-07-16 LAB — IRON AND TIBC
%SAT: 14 % — ABNORMAL LOW (ref 21–57)
IRON: 39 ug/dL — AB (ref 41–142)
TIBC: 284 ug/dL (ref 236–444)
UIBC: 244 ug/dL (ref 120–384)

## 2016-07-16 LAB — FERRITIN: FERRITIN: 159 ng/mL (ref 9–269)

## 2016-07-17 ENCOUNTER — Encounter: Payer: Self-pay | Admitting: Hematology and Oncology

## 2016-07-17 ENCOUNTER — Ambulatory Visit (HOSPITAL_BASED_OUTPATIENT_CLINIC_OR_DEPARTMENT_OTHER): Payer: Self-pay | Admitting: Hematology and Oncology

## 2016-07-17 DIAGNOSIS — D500 Iron deficiency anemia secondary to blood loss (chronic): Secondary | ICD-10-CM

## 2016-07-17 DIAGNOSIS — D563 Thalassemia minor: Secondary | ICD-10-CM

## 2016-07-17 DIAGNOSIS — K9089 Other intestinal malabsorption: Secondary | ICD-10-CM

## 2016-07-17 NOTE — Progress Notes (Signed)
Patient Care Team: Eulas Post, MD as PCP - General (Family Medicine)  DIAGNOSIS:  Encounter Diagnosis  Name Primary?  . Iron deficiency anemia due to chronic blood loss     CHIEF COMPLIANT: Follow-up of iron deficiency anemia  INTERVAL HISTORY: [name] is a 46 year old with above-mentioned history of iron deficiency due to chronic blood loss was here for a follow-up with blood work done yesterday. She reports that she does feel weak and tired but denies any lightheadedness or dizziness. Denies any palpitations. She has had one or 2 episodes of fatigue and weakness are quite significant.  REVIEW OF SYSTEMS:   Constitutional: Denies fevers, chills or abnormal weight loss Eyes: Denies blurriness of vision Ears, nose, mouth, throat, and face: Denies mucositis or sore throat Respiratory: Denies cough, dyspnea or wheezes Cardiovascular: Denies palpitation, chest discomfort Gastrointestinal:  Denies nausea, heartburn or change in bowel habits Skin: Denies abnormal skin rashes Lymphatics: Denies new lymphadenopathy or easy bruising Neurological:Denies numbness, tingling or new weaknesses Behavioral/Psych: Mood is stable, no new changes  Extremities: No lower extremity edema Breast:  denies any pain or lumps or nodules in either breasts All other systems were reviewed with the patient and are negative.  I have reviewed the past medical history, past surgical history, social history and family history with the patient and they are unchanged from previous note.  ALLERGIES:  is allergic to augmentin [amoxicillin-pot clavulanate].  MEDICATIONS:  Current Outpatient Prescriptions  Medication Sig Dispense Refill  . acetaminophen (TYLENOL) 500 MG tablet Take 500 mg by mouth daily as needed for mild pain.    . Calcium Citrate-Vitamin D (CALCIUM CITRATE + PO) Take 600 mg by mouth daily.    . Cholecalciferol (VITAMIN D-3 PO) Take 2,000 Int'l Units by mouth daily.     . furosemide  (LASIX) 20 MG tablet Take 1 tablet (20 mg total) by mouth daily as needed for fluid or edema. 90 tablet 3  . levothyroxine (SYNTHROID, LEVOTHROID) 125 MCG tablet Take 1 tablet (125 mcg total) by mouth daily. 90 tablet 3  . Multiple Vitamins-Iron (ONE DAILY MULTIVITAMIN/IRON PO) Take 1 tablet by mouth daily.     . nitroGLYCERIN (NITROSTAT) 0.4 MG SL tablet Place 1 tablet (0.4 mg total) under the tongue every 5 (five) minutes as needed for chest pain. 25 tablet 3  . pantoprazole (PROTONIX) 40 MG tablet Take 1 tablet (40 mg total) by mouth 2 (two) times daily. 180 tablet 1  . Vitamin D, Ergocalciferol, (DRISDOL) 50000 units CAPS capsule Take 1 capsule (50,000 Units total) by mouth every 7 (seven) days. 12 capsule 2   No current facility-administered medications for this visit.     PHYSICAL EXAMINATION: ECOG PERFORMANCE STATUS: 1 - Symptomatic but completely ambulatory  Vitals:   07/17/16 1140  BP: 118/76  Pulse: 65  Resp: 18  Temp: 97.3 F (36.3 C)   Filed Weights   07/17/16 1140  Weight: 251 lb 3.2 oz (113.9 kg)    GENERAL:alert, no distress and comfortable SKIN: skin color, texture, turgor are normal, no rashes or significant lesions EYES: normal, Conjunctiva are pink and non-injected, sclera clear OROPHARYNX:no exudate, no erythema and lips, buccal mucosa, and tongue normal  NECK: supple, thyroid normal size, non-tender, without nodularity LYMPH:  no palpable lymphadenopathy in the cervical, axillary or inguinal LUNGS: clear to auscultation and percussion with normal breathing effort HEART: regular rate & rhythm and no murmurs and no lower extremity edema ABDOMEN:abdomen soft, non-tender and normal bowel sounds MUSCULOSKELETAL:no cyanosis  of digits and no clubbing  NEURO: alert & oriented x 3 with fluent speech, no focal motor/sensory deficits EXTREMITIES: No lower extremity edema  LABORATORY DATA:  I have reviewed the data as listed   Chemistry      Component Value  Date/Time   NA 137 06/28/2014 1223   K 3.5 06/28/2014 1223   CL 105 06/28/2014 1223   CO2 30 06/28/2014 1223   BUN 7 06/28/2014 1223   CREATININE 0.47 06/28/2014 1223      Component Value Date/Time   CALCIUM 8.3 (L) 06/28/2014 1223   ALKPHOS 89 06/28/2014 1223   AST 15 06/28/2014 1223   ALT 11 06/28/2014 1223   BILITOT 0.3 06/28/2014 1223       Lab Results  Component Value Date   WBC 13.9 (H) 07/16/2016   HGB 11.9 07/16/2016   HCT 38.8 07/16/2016   MCV 63.1 (L) 07/16/2016   PLT 224 07/16/2016   NEUTROABS 9.9 (H) 07/16/2016    ASSESSMENT & PLAN:  Iron deficiency anemia Severe iron deficiency anemia due to malabsorption In the presence of a prior history of thalassemia minor  I discussed with her the process of iron absorption the intestinal system. Because of her prior gastric bypass surgery she has significant malabsorption of iron.   Treatment summary: IV iron Given 03/13/2015; 11/08/2015 2 doses  Response to IV iron therapy: Patient previously had marked improvement in clinical symptoms. Her energy levels have improved after the prior iron infusion. Yesterday her hemoglobin was 11.9, MCV 63, RDW 15.5. Iron studies and ferritin 159, iron saturation 14%.  I do not believe she needs IV iron at this time.  Beta-Thalassemia minor: Observation  Return to clinic in 3 months for lab check day before the visit and follow-up   I spent 25 minutes talking to the patient of which more than half was spent in counseling and coordination of care.  Orders Placed This Encounter  Procedures  . CBC & Diff and Retic    Standing Status:   Future    Standing Expiration Date:   07/17/2017  . Iron and TIBC    Standing Status:   Future    Standing Expiration Date:   07/17/2017  . Ferritin    Standing Status:   Future    Standing Expiration Date:   07/17/2017   The patient has a good understanding of the overall plan. she agrees with it. she will call with any problems that may  develop before the next visit here.   Rulon Eisenmenger, MD 07/17/16

## 2016-07-17 NOTE — Assessment & Plan Note (Signed)
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 Because of her prior gastric bypass surgery she has significant malabsorption of iron.   Treatment summary: IV iron Given 03/13/2015; 11/08/2015 2 doses  Response to IV iron therapy: Patient previously had marked improvement in clinical symptoms. Her energy levels have improved after the prior iron infusion. Yesterday her hemoglobin was 11.9, MCV 63, RDW 15.5. Iron studies and ferritin 159, iron saturation 14%.  I do not believe she needs IV iron at this time.  Beta-Thalassemia minor: Observation  Return to clinic in 3 months for lab check day before the visit and follow-up

## 2016-07-20 ENCOUNTER — Encounter: Payer: Self-pay | Admitting: Family Medicine

## 2016-07-22 ENCOUNTER — Encounter: Payer: Self-pay | Admitting: Cardiovascular Disease

## 2016-07-22 ENCOUNTER — Ambulatory Visit (INDEPENDENT_AMBULATORY_CARE_PROVIDER_SITE_OTHER): Payer: Self-pay | Admitting: Cardiovascular Disease

## 2016-07-22 VITALS — BP 116/76 | HR 71 | Ht 62.0 in | Wt 244.8 lb

## 2016-07-22 DIAGNOSIS — R079 Chest pain, unspecified: Secondary | ICD-10-CM

## 2016-07-22 DIAGNOSIS — G4733 Obstructive sleep apnea (adult) (pediatric): Secondary | ICD-10-CM

## 2016-07-22 DIAGNOSIS — Z9889 Other specified postprocedural states: Secondary | ICD-10-CM

## 2016-07-22 DIAGNOSIS — Z9884 Bariatric surgery status: Secondary | ICD-10-CM

## 2016-07-22 DIAGNOSIS — D563 Thalassemia minor: Secondary | ICD-10-CM

## 2016-07-22 MED ORDER — PANTOPRAZOLE SODIUM 40 MG PO TBEC: 40.0000 mg | DELAYED_RELEASE_TABLET | Freq: Every day | ORAL | 3 refills | Status: DC

## 2016-07-22 MED ORDER — FUROSEMIDE 20 MG PO TABS: 20.0000 mg | ORAL_TABLET | Freq: Every day | ORAL | 3 refills | Status: DC | PRN

## 2016-07-22 NOTE — Patient Instructions (Addendum)
Your physician recommends that you schedule a follow-up appointment only as needed with Dr Claiborne Billings.

## 2016-07-22 NOTE — Progress Notes (Signed)
Cardiology Office Note    Date:  07/24/2016   ID:  [name], DOB 1971-01-02, MRN [id number]  PCP:  Eulas Post, MD  Cardiologist:  Shelva Majestic, MD    She is a former patient of Dr. Mare Ferrari.  She presents to establish cardiology care with me.  History of Present Illness:  [name] is a 46 y.o. female  click previously seen Dr. Mare Ferrari last saw him in February 2016.  She has a history of super morbid obesity and admits that her peak weight was 499 pounds 4.  She ultimately underwent gastric bypass surgery with a gastric sleeve in 2012.  She had lost down to 180 pounds following her surgery, but over time has gradually gained some weight back, such that her present weight is in the 244 range.  Remotely, she was diagnosed with sleep apnea and had been on CPAP therapy, but has not used treatment since her weight loss.  In 2015, she had atypical chest pain and a nuclear perfusion study was normal without ischemia.  EF was 69%.  Her study showed normal systolic and diastolic function, normal valves, and no wall motion abnormalities.  He sees Dr. Carolann Littler, for primary care who checks laboratory.  Previously, she states she is sleeping fairly well.  She is unaware of any snoring.  She denies any exertionally precipitated chest pain.  He does not routinely exercise.  She presents for evaluation.  There is a history of hypothyroidism for which she takes Synthroid 125 g daily and has GERD for which he takes Protonix and has been doing this twice per day.  She takes furosemide on an as-needed basis for leg swelling.   Past Medical History:  Diagnosis Date  . Chest pain   . GERD (gastroesophageal reflux disease)   . Headache   . Thyroid disease     Past Surgical History:  Procedure Laterality Date  . BARIATRIC SURGERY    . CHOLECYSTECTOMY    . galbladder    . UMBILICAL HERNIA REPAIR      Current Medications: Outpatient Medications Prior to Visit  Medication Sig  Dispense Refill  . acetaminophen (TYLENOL) 500 MG tablet Take 500 mg by mouth daily as needed for mild pain.    . Calcium Citrate-Vitamin D (CALCIUM CITRATE + PO) Take 600 mg by mouth daily.    . Cholecalciferol (VITAMIN D-3 PO) Take 2,000 Int'l Units by mouth daily.     Marland Kitchen levothyroxine (SYNTHROID, LEVOTHROID) 125 MCG tablet Take 1 tablet (125 mcg total) by mouth daily. 90 tablet 3  . Multiple Vitamins-Iron (ONE DAILY MULTIVITAMIN/IRON PO) Take 1 tablet by mouth daily.     . nitroGLYCERIN (NITROSTAT) 0.4 MG SL tablet Place 1 tablet (0.4 mg total) under the tongue every 5 (five) minutes as needed for chest pain. 25 tablet 3  . Vitamin D, Ergocalciferol, (DRISDOL) 50000 units CAPS capsule Take 1 capsule (50,000 Units total) by mouth every 7 (seven) days. 12 capsule 2  . furosemide (LASIX) 20 MG tablet Take 1 tablet (20 mg total) by mouth daily as needed for fluid or edema. 90 tablet 3  . pantoprazole (PROTONIX) 40 MG tablet Take 1 tablet (40 mg total) by mouth 2 (two) times daily. (Patient taking differently: Take 40 mg by mouth daily. ) 180 tablet 1   No facility-administered medications prior to visit.      Allergies:   Augmentin [amoxicillin-pot clavulanate]   Social History   Social History  . Marital status: Married  Spouse name: N/A  . Number of children: N/A  . Years of education: N/A   Social History Main Topics  . Smoking status: Never Smoker  . Smokeless tobacco: Never Used  . Alcohol use No  . Drug use: No  . Sexual activity: Not Asked   Other Topics Concern  . None   Social History Narrative  . None     Family History:  The patient's family history includes Diabetes in her father and sister; Heart attack in her mother; Heart disease in her father; Hypertension in her father.   ROS General: Negative; No fevers, chills, or night sweats;  HEENT: Negative; No changes in vision or hearing, sinus congestion, difficulty swallowing Pulmonary: Negative; No cough,  wheezing, shortness of breath, hemoptysis Cardiovascular: Negative; No recent  chest pain, presyncope, syncope, palpitations GI: Negative; No nausea, vomiting, diarrhea, or abdominal pain GU: Negative; No dysuria, hematuria, or difficulty voiding Musculoskeletal: Negative; no myalgias, joint pain, or weakness Hematologic/Oncology: Negative; no easy bruising, bleeding Endocrine: Negative; no heat/cold intolerance; no diabetes Neuro: Negative; no changes in balance, headaches Skin: Negative; No rashes or skin lesions Psychiatric: Negative; No behavioral problems, depression Sleep: History of previous obstructive sleep apnea wall super morbidly obese.; No awareness of recent snoring, daytime sleepiness, hypersomnolence, bruxism, restless legs, hypnogognic hallucinations, no cataplexy Other comprehensive 14 point system review is negative.   PHYSICAL EXAM:   VS:  BP 116/76   Pulse 71   Ht _0  (1.575 m)   Wt 244 lb 12.8 oz (111 kg)   BMI 44.77 kg/m     Repeat blood pressure by me 120/74.  Wt Readings from Last 3 Encounters:  07/22/16 244 lb 12.8 oz (111 kg)  07/17/16 251 lb 3.2 oz (113.9 kg)  04/24/16 240 lb 3.2 oz (109 kg)    General: Alert, oriented, no distress.  Skin: normal turgor, no rashes, warm and dry HEENT: Normocephalic, atraumatic. Pupils equal round and reactive to light; sclera anicteric; extraocular muscles intact; Fundi Normal Nose without nasal septal hypertrophy Mouth/Parynx benign; Mallinpatti scale 3 Neck: No JVD, no carotid bruits; normal carotid upstroke Lungs: clear to ausculatation and percussion; no wheezing or rales Chest wall: without tenderness to palpitation Heart: PMI not displaced, RRR, s1 s2 normal, 1/6 systolic murmur, no diastolic murmur, no rubs, gallops, thrills, or heaves Abdomen: soft, nontender; no hepatosplenomehaly, BS+; abdominal aorta nontender and not dilated by palpation. Back: no CVA tenderness Pulses 2+ Musculoskeletal: full range  of motion, normal strength, no joint deformities Extremities: no clubbing cyanosis or edema, Homan's sign negative  Neurologic: grossly nonfocal; Cranial nerves grossly wnl Psychologic: Normal mood and affect   Studies/Labs Reviewed:   EKG:  EKG is ordered today.  ECG (independently read by me): Normal sinus rhythm at 71 bpm.  Normal intervals.  No significant ST segment change.  Recent Labs: BMP Latest Ref Rng & Units 06/28/2014 05/21/2013 12/02/2009  Glucose 70 - 99 mg/dL 91 94 102(H)  BUN 6 - 23 mg/dL _1 Creatinine 0.40 - 1.20 mg/dL 0.47 0.49(L) 0.6  Sodium 135 - 145 mEq/L 137 138 138  Potassium 3.5 - 5.1 mEq/L 3.5 3.9 4.4  Chloride 96 - 112 mEq/L 105 100 99  CO2 19 - 32 mEq/L 30 25 -  Calcium 8.4 - 10.5 mg/dL 8.3(L) 7.9(L) -     Hepatic Function Latest Ref Rng & Units 06/28/2014 04/27/2006 04/05/2006  Total Protein 6.0 - 8.3 g/dL 7.4 7.8 7.8  Albumin 3.5 - 5.2 g/dL 3.5 3.7  3.5  AST 0 - 37 U/L _0 ALT 0 - 35 U/L _1 Alk Phosphatase 39 - 117 U/L 89 70 69  Total Bilirubin 0.2 - 1.2 mg/dL 0.3 0.3 0.4  Bilirubin, Direct 0.0 - 0.3 mg/dL 0.1 - -    CBC Latest Ref Rng & Units 07/16/2016 04/16/2016 01/16/2016  WBC 3.9 - 10.3 10e3/uL 13.9(H) 10.9(H) 10.9(H)  Hemoglobin 11.6 - 15.9 g/dL 11.9 12.5 11.5(L)  Hematocrit 34.8 - 46.6 % 38.8 41.1 37.5  Platelets 145 - 400 10e3/uL 224 222 236   Lab Results  Component Value Date   MCV 63.1 (L) 07/16/2016   MCV 61.7 (L) 04/16/2016   MCV 61.8 (L) 01/16/2016   Lab Results  Component Value Date   TSH 3.68 04/24/2016   Lab Results  Component Value Date   HGBA1C 6.2 04/24/2016     BNP No results found for: BNP  ProBNP No results found for: PROBNP   Lipid Panel     Component Value Date/Time   CHOL 123 06/28/2014 1223   TRIG 82.0 06/28/2014 1223   HDL 37.10 (L) 06/28/2014 1223   CHOLHDL 3 06/28/2014 1223   VLDL 16.4 06/28/2014 1223   LDLCALC 70 06/28/2014 1223     RADIOLOGY: No results found.   Additional  studies/ records that were reviewed today include:  I reviewed the records from Dr. Mare Ferrari.  I also reviewed the office note from [facility].    ASSESSMENT:    1. Chest pain with minimal risk of acute coronary syndrome   2. OSA (obstructive sleep apnea)   3. Thalassemia trait   4. Morbid obesity ([place])   5. History of gastric bypass      PLAN:  Ms. edema, is a 46 year old morbidly obese female who remotely had weighed 499 pounds prior to undergoing gastric bypass surgery.  She has a history of obstructive sleep apnea but since her 2012 gastric sleeve bypass with weight loss down to a nadir of 180 pounds, she had stopped using CPAP therapy.  She has gained a proximally 60 pounds back.  I discussed with her at length potential signs and symptoms of OSA and at present she feels she is sleeping fairly well.  She denies nocturia.  She believes her sleep is restorative.  She is unaware of any residual daytime sleepiness.  He notes some stress mediated.  Sharp vague chest discomfort episodes which are which sound more musculoskeletal and nonischemic mediated.  Remotely, in the past she had used her family members nitroglycerin.  Her blood pressure today is stable.  Her BMI is 44, which is compatible with morbid obesity.  Weight loss was recommended.  I discussed the importance of exercise at least 5 days per week for a minimum of 30 minutes.  I recommended she reduce her pantoprazole to at least 40 mg daily rather than twice a day since there is no history of recent active ulcer.  She only rarely takes furosemide as needed for swelling.  She has a history of thalassemia trait and this has been stable.  She will return to her primary care.  I will see her on an as-needed basis in the future if problems arise.   Medication Adjustments/Labs and Tests Ordered: Current medicines are reviewed at length with the patient today.  Concerns regarding medicines are outlined above.  Medication changes, Labs and  Tests ordered today are listed in the Patient Instructions below. Patient Instructions  Your physician recommends that you  schedule a follow-up appointment only as needed with Dr Claiborne Billings.     Signed, Shelva Majestic, MD  07/24/2016 12:51 PM    [facility] [address] [place], [place], [place]  [address] Phone: [phone]

## 2016-09-30 ENCOUNTER — Encounter: Payer: Self-pay | Admitting: Cardiology

## 2016-09-30 DIAGNOSIS — Z9119 Patient's noncompliance with other medical treatment and regimen: Secondary | ICD-10-CM | POA: Insufficient documentation

## 2016-09-30 DIAGNOSIS — Z91199 Patient's noncompliance with other medical treatment and regimen due to unspecified reason: Secondary | ICD-10-CM | POA: Insufficient documentation

## 2016-09-30 DIAGNOSIS — R197 Diarrhea, unspecified: Secondary | ICD-10-CM | POA: Insufficient documentation

## 2016-10-01 ENCOUNTER — Encounter: Payer: Self-pay | Admitting: Family Medicine

## 2016-10-15 ENCOUNTER — Other Ambulatory Visit (HOSPITAL_BASED_OUTPATIENT_CLINIC_OR_DEPARTMENT_OTHER): Payer: Self-pay

## 2016-10-15 DIAGNOSIS — D509 Iron deficiency anemia, unspecified: Secondary | ICD-10-CM

## 2016-10-15 LAB — CBC & DIFF AND RETIC
BASO%: 0.5 % (ref 0.0–2.0)
BASOS ABS: 0.1 10*3/uL (ref 0.0–0.1)
EOS%: 1.4 % (ref 0.0–7.0)
Eosinophils Absolute: 0.2 10*3/uL (ref 0.0–0.5)
HEMATOCRIT: 38.7 % (ref 34.8–46.6)
HGB: 11.8 g/dL (ref 11.6–15.9)
Immature Retic Fract: 1.7 % (ref 1.60–10.00)
LYMPH#: 4.5 10*3/uL — AB (ref 0.9–3.3)
LYMPH%: 35.3 % (ref 14.0–49.7)
MCH: 19.2 pg — AB (ref 25.1–34.0)
MCHC: 30.5 g/dL — AB (ref 31.5–36.0)
MCV: 63 fL — ABNORMAL LOW (ref 79.5–101.0)
MONO#: 0.5 10*3/uL (ref 0.1–0.9)
MONO%: 3.8 % (ref 0.0–14.0)
NEUT#: 7.5 10*3/uL — ABNORMAL HIGH (ref 1.5–6.5)
NEUT%: 59 % (ref 38.4–76.8)
PLATELETS: 208 10*3/uL (ref 145–400)
RBC: 6.14 10*6/uL — AB (ref 3.70–5.45)
RDW: 15.6 % — AB (ref 11.2–14.5)
RETIC %: 0.98 % (ref 0.70–2.10)
RETIC CT ABS: 60.17 10*3/uL (ref 33.70–90.70)
WBC: 12.8 10*3/uL — ABNORMAL HIGH (ref 3.9–10.3)

## 2016-10-15 LAB — IRON AND TIBC
%SAT: 21 % (ref 21–57)
Iron: 62 ug/dL (ref 41–142)
TIBC: 289 ug/dL (ref 236–444)
UIBC: 227 ug/dL (ref 120–384)

## 2016-10-15 LAB — FERRITIN: Ferritin: 166 ng/ml (ref 9–269)

## 2016-10-16 ENCOUNTER — Ambulatory Visit (HOSPITAL_BASED_OUTPATIENT_CLINIC_OR_DEPARTMENT_OTHER): Payer: Self-pay | Admitting: Hematology and Oncology

## 2016-10-16 ENCOUNTER — Encounter: Payer: Self-pay | Admitting: Hematology and Oncology

## 2016-10-16 VITALS — BP 100/52 | HR 69 | Temp 97.8°F | Resp 18 | Ht 62.0 in | Wt 250.4 lb

## 2016-10-16 DIAGNOSIS — D561 Beta thalassemia: Secondary | ICD-10-CM

## 2016-10-16 DIAGNOSIS — D500 Iron deficiency anemia secondary to blood loss (chronic): Secondary | ICD-10-CM

## 2016-10-16 NOTE — Progress Notes (Signed)
Patient Care Team: Eulas Post, MD as PCP - General (Family Medicine)  DIAGNOSIS:  Encounter Diagnosis  Name Primary?  . Iron deficiency anemia due to chronic blood loss Yes    SUMMARY OF ONCOLOGIC HISTORY: IV iron given July 2017 CHIEF COMPLIANT: Denies any symptoms of iron deficiency  INTERVAL HISTORY: [name] is a 46 year old with above-mentioned history of iron deficiency anemia due to chronic blood loss who appears to be doing quite well. The blood loss has subsided. She has not required any IV iron since July 2017. She is here for three-month follow-up with blood work done yesterday. She is currently fasting for [place].   REVIEW OF SYSTEMS:   Constitutional: Denies fevers, chills or abnormal weight loss Eyes: Denies blurriness of vision Ears, nose, mouth, throat, and face: Denies mucositis or sore throat Respiratory: Denies cough, dyspnea or wheezes Cardiovascular: Denies palpitation, chest discomfort Gastrointestinal:  Denies nausea, heartburn or change in bowel habits Skin: Denies abnormal skin rashes Lymphatics: Denies new lymphadenopathy or easy bruising Neurological:Denies numbness, tingling or new weaknesses Behavioral/Psych: Mood is stable, no new changes  Extremities: No lower extremity edema  All other systems were reviewed with the patient and are negative.  I have reviewed the past medical history, past surgical history, social history and family history with the patient and they are unchanged from previous note.  ALLERGIES:  is allergic to augmentin [amoxicillin-pot clavulanate].  MEDICATIONS:  Current Outpatient Prescriptions  Medication Sig Dispense Refill  . acetaminophen (TYLENOL) 500 MG tablet Take 500 mg by mouth daily as needed for mild pain.    . Calcium Citrate-Vitamin D (CALCIUM CITRATE + PO) Take 600 mg by mouth daily.    . Cholecalciferol (VITAMIN D-3 PO) Take 2,000 Int'l Units by mouth daily.     . furosemide (LASIX) 20 MG tablet  Take 1 tablet (20 mg total) by mouth daily as needed for fluid or edema. 30 tablet 3  . levothyroxine (SYNTHROID, LEVOTHROID) 125 MCG tablet Take 1 tablet (125 mcg total) by mouth daily. 90 tablet 3  . Multiple Vitamins-Iron (ONE DAILY MULTIVITAMIN/IRON PO) Take 1 tablet by mouth daily.     . nitroGLYCERIN (NITROSTAT) 0.4 MG SL tablet Place 1 tablet (0.4 mg total) under the tongue every 5 (five) minutes as needed for chest pain. 25 tablet 3  . pantoprazole (PROTONIX) 40 MG tablet Take 1 tablet (40 mg total) by mouth daily. 30 tablet 3  . Vitamin D, Ergocalciferol, (DRISDOL) 50000 units CAPS capsule Take 1 capsule (50,000 Units total) by mouth every 7 (seven) days. 12 capsule 2   No current facility-administered medications for this visit.     PHYSICAL EXAMINATION: ECOG PERFORMANCE STATUS: 0 - Asymptomatic  Vitals:   10/16/16 1155  BP: (!) 100/52  Pulse: 69  Resp: 18  Temp: 97.8 F (36.6 C)   Filed Weights   10/16/16 1155  Weight: 250 lb 6.4 oz (113.6 kg)    GENERAL:alert, no distress and comfortable SKIN: skin color, texture, turgor are normal, no rashes or significant lesions EYES: normal, Conjunctiva are pink and non-injected, sclera clear OROPHARYNX:no exudate, no erythema and lips, buccal mucosa, and tongue normal  NECK: supple, thyroid normal size, non-tender, without nodularity LYMPH:  no palpable lymphadenopathy in the cervical, axillary or inguinal LUNGS: clear to auscultation and percussion with normal breathing effort HEART: regular rate & rhythm and no murmurs and no lower extremity edema ABDOMEN:abdomen soft, non-tender and normal bowel sounds MUSCULOSKELETAL:no cyanosis of digits and no clubbing  NEURO:  alert & oriented x 3 with fluent speech, no focal motor/sensory deficits EXTREMITIES: No lower extremity edema  LABORATORY DATA:  I have reviewed the data as listed   Chemistry      Component Value Date/Time   NA 137 06/28/2014 1223   K 3.5 06/28/2014 1223    CL 105 06/28/2014 1223   CO2 30 06/28/2014 1223   BUN 7 06/28/2014 1223   CREATININE 0.47 06/28/2014 1223      Component Value Date/Time   CALCIUM 8.3 (L) 06/28/2014 1223   ALKPHOS 89 06/28/2014 1223   AST 15 06/28/2014 1223   ALT 11 06/28/2014 1223   BILITOT 0.3 06/28/2014 1223       Lab Results  Component Value Date   WBC 12.8 (H) 10/15/2016   HGB 11.8 10/15/2016   HCT 38.7 10/15/2016   MCV 63.0 (L) 10/15/2016   PLT 208 10/15/2016   NEUTROABS 7.5 (H) 10/15/2016    ASSESSMENT & PLAN:  Iron deficiency anemia due to chronic blood loss: Iron studies were reviewed and she does not have any evidence of iron deficiency. Patient does not have any symptoms of iron deficiency No further problems with blood loss  I recommended a six-month follow-up with iron studies. If these results are also normal then maybe we can see her in one year and after that as needed.  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 Return to clinic in 6 months I spent 15 minutes talking to the patient of which more than half was spent in counseling and coordination of care.  Orders Placed This Encounter  Procedures  . CBC with Differential    Standing Status:   Future    Standing Expiration Date:   10/16/2017  . Iron and TIBC    Standing Status:   Future    Standing Expiration Date:   10/16/2017  . Ferritin    Standing Status:   Future    Standing Expiration Date:   10/16/2017   The patient has a good understanding of the overall plan. she agrees with it. she will call with any problems that may develop before the next visit here.   Rulon Eisenmenger, MD 10/16/16

## 2016-12-20 ENCOUNTER — Other Ambulatory Visit: Payer: Self-pay | Admitting: Family Medicine

## 2017-01-28 ENCOUNTER — Encounter: Payer: Self-pay | Admitting: Family Medicine

## 2017-04-19 ENCOUNTER — Other Ambulatory Visit: Payer: Self-pay

## 2017-04-20 ENCOUNTER — Encounter: Payer: Self-pay | Admitting: Family Medicine

## 2017-04-20 ENCOUNTER — Ambulatory Visit: Payer: Self-pay | Admitting: Hematology and Oncology

## 2017-04-21 ENCOUNTER — Other Ambulatory Visit (HOSPITAL_BASED_OUTPATIENT_CLINIC_OR_DEPARTMENT_OTHER): Payer: Self-pay

## 2017-04-21 DIAGNOSIS — D500 Iron deficiency anemia secondary to blood loss (chronic): Secondary | ICD-10-CM

## 2017-04-21 DIAGNOSIS — D509 Iron deficiency anemia, unspecified: Secondary | ICD-10-CM

## 2017-04-21 LAB — CBC & DIFF AND RETIC
BASO%: 0.3 % (ref 0.0–2.0)
BASOS ABS: 0 10*3/uL (ref 0.0–0.1)
EOS%: 1.4 % (ref 0.0–7.0)
Eosinophils Absolute: 0.2 10*3/uL (ref 0.0–0.5)
HEMATOCRIT: 39.8 % (ref 34.8–46.6)
HGB: 12.2 g/dL (ref 11.6–15.9)
Immature Retic Fract: 3.3 % (ref 1.60–10.00)
LYMPH%: 28.6 % (ref 14.0–49.7)
MCH: 19.3 pg — ABNORMAL LOW (ref 25.1–34.0)
MCHC: 30.7 g/dL — AB (ref 31.5–36.0)
MCV: 62.9 fL — ABNORMAL LOW (ref 79.5–101.0)
MONO#: 0.5 10*3/uL (ref 0.1–0.9)
MONO%: 3.7 % (ref 0.0–14.0)
NEUT#: 9.1 10*3/uL — ABNORMAL HIGH (ref 1.5–6.5)
NEUT%: 66 % (ref 38.4–76.8)
Platelets: 239 10*3/uL (ref 145–400)
RBC: 6.33 10*6/uL — AB (ref 3.70–5.45)
RDW: 15.9 % — AB (ref 11.2–14.5)
RETIC %: 1.01 % (ref 0.70–2.10)
RETIC CT ABS: 63.93 10*3/uL (ref 33.70–90.70)
WBC: 13.8 10*3/uL — ABNORMAL HIGH (ref 3.9–10.3)
lymph#: 3.9 10*3/uL — ABNORMAL HIGH (ref 0.9–3.3)

## 2017-04-21 LAB — IRON AND TIBC
%SAT: 10 % — AB (ref 21–57)
Iron: 32 ug/dL — ABNORMAL LOW (ref 41–142)
TIBC: 321 ug/dL (ref 236–444)
UIBC: 289 ug/dL (ref 120–384)

## 2017-04-21 LAB — FERRITIN: Ferritin: 140 ng/ml (ref 9–269)

## 2017-04-22 ENCOUNTER — Ambulatory Visit (HOSPITAL_BASED_OUTPATIENT_CLINIC_OR_DEPARTMENT_OTHER): Payer: Self-pay | Admitting: Hematology and Oncology

## 2017-04-22 ENCOUNTER — Telehealth: Payer: Self-pay | Admitting: Hematology and Oncology

## 2017-04-22 VITALS — BP 114/80 | HR 75 | Temp 98.8°F | Resp 19 | Ht 62.0 in | Wt 246.1 lb

## 2017-04-22 DIAGNOSIS — D509 Iron deficiency anemia, unspecified: Secondary | ICD-10-CM

## 2017-04-22 DIAGNOSIS — Z1239 Encounter for other screening for malignant neoplasm of breast: Secondary | ICD-10-CM

## 2017-04-22 DIAGNOSIS — D500 Iron deficiency anemia secondary to blood loss (chronic): Secondary | ICD-10-CM

## 2017-04-22 NOTE — Telephone Encounter (Signed)
Gave patient avs with appts per 12/13 los.  °

## 2017-04-22 NOTE — Progress Notes (Signed)
Patient Care Team: Eulas Post, MD as PCP - General (Family Medicine)  DIAGNOSIS:  Encounter Diagnoses  Name Primary?  . Iron deficiency anemia due to chronic blood loss   . Screening for breast cancer Yes   Last iron infusion June 2017  CHIEF COMPLIANT: Follow-up of iron deficiency anemia  INTERVAL HISTORY: [name] is a 46 year old with above-mentioned history of iron deficiency anemia who is here for follow-up and reports no new bleeding symptoms.  She has been feeling reasonably well.  Denies any headache shortness of breath lightheadedness dizziness.  She had felt a small lump in the right breast and was concerned about it.  She has not had a mammogram in a very long time.  REVIEW OF SYSTEMS:   Constitutional: Denies fevers, chills or abnormal weight loss Eyes: Denies blurriness of vision Ears, nose, mouth, throat, and face: Denies mucositis or sore throat Respiratory: Denies cough, dyspnea or wheezes Cardiovascular: Denies palpitation, chest discomfort Gastrointestinal:  Denies nausea, heartburn or change in bowel habits Skin: Denies abnormal skin rashes Lymphatics: Denies new lymphadenopathy or easy bruising Neurological:Denies numbness, tingling or new weaknesses Behavioral/Psych: Mood is stable, no new changes  Extremities: No lower extremity edema Breast: Small lump in the right breast All other systems were reviewed with the patient and are negative.  I have reviewed the past medical history, past surgical history, social history and family history with the patient and they are unchanged from previous note.  ALLERGIES:  is allergic to augmentin [amoxicillin-pot clavulanate].  MEDICATIONS:  Current Outpatient Medications  Medication Sig Dispense Refill  . acetaminophen (TYLENOL) 500 MG tablet Take 500 mg by mouth daily as needed for mild pain.    . Calcium Citrate-Vitamin D (CALCIUM CITRATE + PO) Take 600 mg by mouth daily.    . Cholecalciferol (VITAMIN  D-3 PO) Take 2,000 Int'l Units by mouth daily.     . furosemide (LASIX) 20 MG tablet Take 1 tablet (20 mg total) by mouth daily as needed for fluid or edema. 30 tablet 3  . levothyroxine (SYNTHROID, LEVOTHROID) 125 MCG tablet TAKE ONE TABLET BY MOUTH ONCE DAILY 90 tablet 1  . Multiple Vitamins-Iron (ONE DAILY MULTIVITAMIN/IRON PO) Take 1 tablet by mouth daily.     . nitroGLYCERIN (NITROSTAT) 0.4 MG SL tablet Place 1 tablet (0.4 mg total) under the tongue every 5 (five) minutes as needed for chest pain. 25 tablet 3  . pantoprazole (PROTONIX) 40 MG tablet Take 1 tablet (40 mg total) by mouth daily. 30 tablet 3  . Vitamin D, Ergocalciferol, (DRISDOL) 50000 units CAPS capsule Take 1 capsule (50,000 Units total) by mouth every 7 (seven) days. 12 capsule 2   No current facility-administered medications for this visit.     PHYSICAL EXAMINATION: ECOG PERFORMANCE STATUS: 1 - Symptomatic but completely ambulatory  Vitals:   04/22/17 1454  BP: 114/80  Pulse: 75  Resp: 19  Temp: 98.8 F (37.1 C)  SpO2: 99%   Filed Weights   04/22/17 1454  Weight: 246 lb 1.6 oz (111.6 kg)    GENERAL:alert, no distress and comfortable SKIN: skin color, texture, turgor are normal, no rashes or significant lesions EYES: normal, Conjunctiva are pink and non-injected, sclera clear OROPHARYNX:no exudate, no erythema and lips, buccal mucosa, and tongue normal  NECK: supple, thyroid normal size, non-tender, without nodularity LYMPH:  no palpable lymphadenopathy in the cervical, axillary or inguinal LUNGS: clear to auscultation and percussion with normal breathing effort HEART: regular rate & rhythm and no murmurs  and no lower extremity edema ABDOMEN:abdomen soft, non-tender and normal bowel sounds MUSCULOSKELETAL:no cyanosis of digits and no clubbing  NEURO: alert & oriented x 3 with fluent speech, no focal motor/sensory deficits EXTREMITIES: No lower extremity edema BREAST: Right breast palpable lump no palpable  axillary supraclavicular or infraclavicular adenopathy no breast tenderness or nipple discharge. (exam performed in the presence of a chaperone)  LABORATORY DATA:  I have reviewed the data as listed   Chemistry      Component Value Date/Time   NA 137 06/28/2014 1223   K 3.5 06/28/2014 1223   CL 105 06/28/2014 1223   CO2 30 06/28/2014 1223   BUN 7 06/28/2014 1223   CREATININE 0.47 06/28/2014 1223      Component Value Date/Time   CALCIUM 8.3 (L) 06/28/2014 1223   ALKPHOS 89 06/28/2014 1223   AST 15 06/28/2014 1223   ALT 11 06/28/2014 1223   BILITOT 0.3 06/28/2014 1223       Lab Results  Component Value Date   WBC 13.8 (H) 04/21/2017   HGB 12.2 04/21/2017   HCT 39.8 04/21/2017   MCV 62.9 (L) 04/21/2017   PLT 239 04/21/2017   NEUTROABS 9.1 (H) 04/21/2017    ASSESSMENT & PLAN:  Iron deficiency anemia Iron deficiency anemia due to chronic blood loss: Iron studies were reviewed and she does not have any evidence of iron deficiency. 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  Lab review: Ferritin 140, hemoglobin 12.2, iron saturation 10%, TIBC 231  Palpable lump in the right breast: I will set her up for a mammogram.  Since she does not have insurance.  We will try to request mammogram scholarship fund to pay for her mammogram.  Return to clinic 1 year for follow-up    I spent 25 minutes talking to the patient of which more than half was spent in counseling and coordination of care.  Orders Placed This Encounter  Procedures  . MM DIAG BREAST TOMO BILATERAL    Patient doesnot have insurance. Please look at financial aid options    Standing Status:   Future    Standing Expiration Date:   04/22/2018    Order Specific Question:   Reason for Exam (SYMPTOM  OR DIAGNOSIS REQUIRED)    Answer:   left breast lump    Order Specific Question:   Is  the patient pregnant?    Answer:   No    Order Specific Question:   Preferred imaging location?    Answer:   [facility]  . CBC with Differential    Standing Status:   Future    Standing Expiration Date:   04/22/2018  . Iron and TIBC    Standing Status:   Future    Standing Expiration Date:   04/22/2018  . Ferritin    Standing Status:   Future    Standing Expiration Date:   04/22/2018   The patient has a good understanding of the overall plan. she agrees with it. she will call with any problems that may develop before the next visit here.   Rulon Eisenmenger, MD 04/22/17

## 2017-04-22 NOTE — Assessment & Plan Note (Signed)
Iron deficiency anemia due to chronic blood loss: Iron studies were reviewed and she does not have any evidence of iron deficiency. 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   Lab review: Ferritin 140, hemoglobin 12.2, iron saturation 10%, TIBC 231  Return to clinic 1 year for follow-up

## 2017-05-07 ENCOUNTER — Other Ambulatory Visit (HOSPITAL_COMMUNITY)
Admission: RE | Admit: 2017-05-07 | Discharge: 2017-05-07 | Disposition: A | Discharge status: Home / Self Care | Payer: Self-pay | Source: Ambulatory Visit | Attending: Family Medicine | Admitting: Family Medicine

## 2017-05-07 ENCOUNTER — Ambulatory Visit (INDEPENDENT_AMBULATORY_CARE_PROVIDER_SITE_OTHER): Payer: Self-pay | Admitting: Family Medicine

## 2017-05-07 ENCOUNTER — Encounter: Payer: Self-pay | Admitting: Family Medicine

## 2017-05-07 VITALS — BP 104/82 | HR 81 | Temp 99.0°F | Ht 61.0 in | Wt 293.5 lb

## 2017-05-07 DIAGNOSIS — Z124 Encounter for screening for malignant neoplasm of cervix: Secondary | ICD-10-CM

## 2017-05-07 DIAGNOSIS — Z9884 Bariatric surgery status: Secondary | ICD-10-CM

## 2017-05-07 DIAGNOSIS — Z0000 Encounter for general adult medical examination without abnormal findings: Secondary | ICD-10-CM

## 2017-05-07 DIAGNOSIS — Z1331 Encounter for screening for depression: Secondary | ICD-10-CM

## 2017-05-07 LAB — BASIC METABOLIC PANEL
BUN: 7 mg/dL (ref 6–23)
CALCIUM: 8.4 mg/dL (ref 8.4–10.5)
CHLORIDE: 102 meq/L (ref 96–112)
CO2: 29 meq/L (ref 19–32)
CREATININE: 0.54 mg/dL (ref 0.40–1.20)
GFR: 128.58 mL/min (ref >60.00)
GLUCOSE: 83 mg/dL (ref 70–99)
Potassium: 4.1 mEq/L (ref 3.5–5.1)
Sodium: 137 mEq/L (ref 135–145)

## 2017-05-07 LAB — HEPATIC FUNCTION PANEL
ALBUMIN: 3.7 g/dL (ref 3.5–5.2)
ALK PHOS: 76 U/L (ref 39–117)
ALT: 18 U/L (ref 0–35)
AST: 13 U/L (ref 0–37)
BILIRUBIN DIRECT: 0.1 mg/dL (ref 0.0–0.3)
Total Bilirubin: 0.4 mg/dL (ref 0.2–1.2)
Total Protein: 7.3 g/dL (ref 6.0–8.3)

## 2017-05-07 LAB — CBC WITH DIFFERENTIAL/PLATELET
BASOS ABS: 0.1 10*3/uL (ref 0.0–0.1)
Basophils Relative: 0.8 % (ref 0.0–3.0)
EOS PCT: 1.2 % (ref 0.0–5.0)
Eosinophils Absolute: 0.1 10*3/uL (ref 0.0–0.7)
HCT: 40.5 % (ref 36.0–46.0)
HEMOGLOBIN: 12.3 g/dL (ref 12.0–15.0)
Lymphocytes Relative: 34.9 % (ref 12.0–46.0)
Lymphs Abs: 3.7 10*3/uL (ref 0.7–4.0)
MCHC: 30.3 g/dL (ref 30.0–36.0)
MONOS PCT: 5.1 % (ref 3.0–12.0)
Monocytes Absolute: 0.5 10*3/uL (ref 0.1–1.0)
Neutro Abs: 6.1 10*3/uL (ref 1.4–7.7)
Neutrophils Relative %: 58 % (ref 43.0–77.0)
Platelets: 244 10*3/uL (ref 150.0–400.0)
RBC: 6.52 Mil/uL — AB (ref 3.87–5.11)
RDW: 16.1 % — ABNORMAL HIGH (ref 11.5–15.5)
WBC: 10.6 10*3/uL — AB (ref 4.0–10.5)

## 2017-05-07 LAB — VITAMIN B12: Vitamin B-12: 827 pg/mL (ref 211–911)

## 2017-05-07 LAB — VITAMIN D 25 HYDROXY (VIT D DEFICIENCY, FRACTURES): VITD: 27.16 ng/mL — ABNORMAL LOW (ref 30.00–100.00)

## 2017-05-07 LAB — LIPID PANEL
Cholesterol: 128 mg/dL (ref 0–200)
HDL: 36.3 mg/dL — ABNORMAL LOW (ref >39.00)
LDL Cholesterol: 70 mg/dL (ref 0–99)
NONHDL: 91.25
Total CHOL/HDL Ratio: 4
Triglycerides: 108 mg/dL (ref 0.0–149.0)
VLDL: 21.6 mg/dL (ref 0.0–40.0)

## 2017-05-07 LAB — HEMOGLOBIN A1C: Hgb A1c MFr Bld: 6.3 % (ref 4.6–6.5)

## 2017-05-07 LAB — TSH: TSH: 3.21 u[IU]/mL (ref 0.35–4.50)

## 2017-05-07 NOTE — Progress Notes (Signed)
Subjective:     Patient ID: [name], female   DOB: 1970-11-23, 46 y.o.   MRN: [id number]  HPI Patient is here for physical exam. Her chronic problems include history of morbid obesity, history of gastric bypass surgery 2012, history of prediabetes, thalassemia trait, history of obstructive sleep apnea, hypothyroidism, GERD, and history of iron deficiency. She is followed by hematology. She has history of low vitamin D and takes currently 6000 international units daily.  She's not had mammogram in several years and also no Pap smear in estimated 8 years. She is not sexually active and is low risk. She had colonoscopy 2017 with recommend five-year follow-up. Nonsmoker.  Past Medical History:  Diagnosis Date  . Chest pain   . GERD (gastroesophageal reflux disease)   . Headache   . Thyroid disease    Past Surgical History:  Procedure Laterality Date  . BARIATRIC SURGERY    . CHOLECYSTECTOMY    . galbladder    . UMBILICAL HERNIA REPAIR      reports that  has never smoked. she has never used smokeless tobacco. She reports that she does not drink alcohol or use drugs. family history includes Diabetes in her father and sister; Heart attack in her mother; Heart disease in her father; Hypertension in her father. Allergies  Allergen Reactions  . Augmentin [Amoxicillin-Pot Clavulanate] Shortness Of Breath     Review of Systems  Constitutional: Negative for activity change, appetite change, fatigue, fever and unexpected weight change.  HENT: Negative for ear pain, hearing loss, sore throat and trouble swallowing.   Eyes: Negative for visual disturbance.  Respiratory: Negative for cough and shortness of breath.   Cardiovascular: Negative for chest pain and palpitations.  Gastrointestinal: Negative for abdominal pain, blood in stool, constipation and diarrhea.  Genitourinary: Negative for dysuria and hematuria.  Musculoskeletal: Negative for arthralgias, back pain and myalgias.  Skin:  Negative for rash.  Neurological: Negative for dizziness, syncope and headaches.  Hematological: Negative for adenopathy.  Psychiatric/Behavioral: Negative for confusion and dysphoric mood.       Objective:   Physical Exam  Constitutional: She is oriented to person, place, and time. She appears well-developed and well-nourished.  HENT:  Head: Normocephalic and atraumatic.  Eyes: EOM are normal. Pupils are equal, round, and reactive to light.  Neck: Normal range of motion. Neck supple. No thyromegaly present.  Cardiovascular: Normal rate, regular rhythm and normal heart sounds.  No murmur heard. Pulmonary/Chest: Breath sounds normal. No respiratory distress. She has no wheezes. She has no rales.  Breasts symmetric with no mass.  Abdominal: Soft. Bowel sounds are normal. She exhibits no distension and no mass. There is no tenderness. There is no rebound and no guarding.  Genitourinary: Vagina normal. No vaginal discharge found.  Genitourinary Comments: Cervix normal appearance.  Pap obtained.  Bimanual difficult secondary to her size- but no mass and no tenderness noted.  Musculoskeletal: Normal range of motion. She exhibits no edema.  Lymphadenopathy:    She has no cervical adenopathy.  Neurological: She is alert and oriented to person, place, and time. She displays normal reflexes. No cranial nerve deficit.  Skin: No rash noted.  Psychiatric: She has a normal mood and affect. Her behavior is normal. Judgment and thought content normal.       Assessment:     Physical exam. The following issues were addressed    Plan:     -Obtain labwork. Include hemoglobin A1c, vitamin D level, and B12 given her past history of  gastric bypass surgery -Pap smear obtained -Set up repeat mammogram  Eulas Post MD [facility] at [facility]

## 2017-05-07 NOTE — Patient Instructions (Signed)
We will call you with lab work done today Set up repeat mammogram

## 2017-05-10 LAB — CYTOLOGY - PAP
Diagnosis: NEGATIVE
HPV (WINDOPATH): NOT DETECTED

## 2017-05-17 ENCOUNTER — Other Ambulatory Visit (HOSPITAL_COMMUNITY): Payer: Self-pay | Admitting: *Deleted

## 2017-05-17 DIAGNOSIS — N6310 Unspecified lump in the right breast, unspecified quadrant: Secondary | ICD-10-CM

## 2017-05-18 ENCOUNTER — Telehealth: Payer: Self-pay | Admitting: Cardiovascular Disease

## 2017-05-18 NOTE — Telephone Encounter (Signed)
That will be fine with me. 

## 2017-05-18 NOTE — Telephone Encounter (Signed)
Patient calling, states that she would like to switch from Dr. Claiborne Billings to Dr. Acie Fredrickson, because Dr. Acie Fredrickson is her mother's cardiologist. Would the switch be okay?

## 2017-05-19 NOTE — Telephone Encounter (Signed)
Okay by me.

## 2017-05-20 NOTE — Telephone Encounter (Signed)
Routing to scheduling to arrange follow-up with Dr. Acie Fredrickson

## 2017-05-27 ENCOUNTER — Other Ambulatory Visit: Payer: Self-pay | Admitting: Cardiovascular Disease

## 2017-05-27 ENCOUNTER — Inpatient Hospital Stay (HOSPITAL_COMMUNITY): Admission: RE | Admit: 2017-05-27 | Payer: Self-pay | Source: Ambulatory Visit

## 2017-05-27 ENCOUNTER — Other Ambulatory Visit: Payer: Self-pay

## 2017-05-28 ENCOUNTER — Encounter: Payer: Self-pay | Admitting: Cardiovascular Disease

## 2017-05-28 NOTE — Telephone Encounter (Signed)
REFILL 

## 2017-06-02 ENCOUNTER — Telehealth: Payer: Self-pay | Admitting: Family Medicine

## 2017-06-02 NOTE — Telephone Encounter (Signed)
Pt would like to have a note stating that she is not able to exercise and would like to get out of the contract.  She is unable to work out for 2 years due to medical issues and [place] (Facility: [employer] at [employer]).

## 2017-06-03 ENCOUNTER — Ambulatory Visit (HOSPITAL_COMMUNITY)
Admission: RE | Admit: 2017-06-03 | Discharge: 2017-06-03 | Disposition: A | Discharge status: Home / Self Care | Payer: Self-pay | Source: Ambulatory Visit | Attending: Obstetrics and Gynecology | Admitting: Obstetrics and Gynecology

## 2017-06-03 ENCOUNTER — Encounter (HOSPITAL_COMMUNITY): Payer: Self-pay

## 2017-06-03 VITALS — BP 112/84 | Ht 62.0 in

## 2017-06-03 DIAGNOSIS — Z1239 Encounter for other screening for malignant neoplasm of breast: Secondary | ICD-10-CM

## 2017-06-03 DIAGNOSIS — N644 Mastodynia: Secondary | ICD-10-CM

## 2017-06-03 NOTE — Patient Instructions (Signed)
Explained breast self awareness with [name]. Patient did not need a Pap smear today due to last Pap smear and HPV typing was 05/07/2017. Let her know BCCCP will cover Pap smears and HPV typing every 5 years unless has a history of abnormal Pap smears. Referred patient to the [facility] for diagnostic mammogram and possible right breast ultrasound. Appointment scheduled for Friday, June 04, 2017 at 1030.  Patient aware of appointment and will be there. [name] verbalized understanding.  Timara Loma, Arvil Chaco, RN 1:34 PM

## 2017-06-03 NOTE — Progress Notes (Signed)
Complaints of right breast lump and pain x 2 months that comes and goes. Patient rates the pain at a 4-5 out of 10 stating it is worse during her menstrual period.  Pap Smear: Pap smear not completed today. Last Pap smear was 05/07/2017 at [facility] and normal with negative HPV. Per patient has no history of an abnormal Pap smear. Last Pap smear result is in Epic.  Physical exam: Breasts Breasts symmetrical. Healed scars bilateral lower breast that per patient was from surgery to remove excess skin. Bilateral nipple inversion that is greater within the right nipple and has always been that way per patient. No nipple discharge bilateral breasts. No lymphadenopathy. No lumps palpated bilateral breasts. Unable to palpated a lump in patients area of concern. Complaints of right axillary tenderness on exam. Referred patient to the [facility] for diagnostic mammogram and possible right breast ultrasound. Appointment scheduled for Friday, June 04, 2017 at 1030.        Pelvic/Bimanual No Pap smear completed today since last Pap smear and HPV typing was 05/07/2017. Pap smear not indicated per BCCCP guidelines.   Smoking History: Patient has never smoked.  Patient Navigation: Patient education provided. Access to services provided for patient through [facility] program.

## 2017-06-04 ENCOUNTER — Ambulatory Visit
Admission: RE | Admit: 2017-06-04 | Discharge: 2017-06-04 | Disposition: A | Discharge status: Home / Self Care | Payer: No Typology Code available for payment source | Source: Ambulatory Visit | Attending: Obstetrics and Gynecology | Admitting: Obstetrics and Gynecology

## 2017-06-04 ENCOUNTER — Encounter (HOSPITAL_COMMUNITY): Payer: Self-pay | Admitting: *Deleted

## 2017-06-04 DIAGNOSIS — N6310 Unspecified lump in the right breast, unspecified quadrant: Secondary | ICD-10-CM

## 2017-06-06 ENCOUNTER — Encounter: Payer: Self-pay | Admitting: Family Medicine

## 2017-06-07 ENCOUNTER — Encounter: Payer: Self-pay | Admitting: Family Medicine

## 2017-06-07 NOTE — Telephone Encounter (Signed)
Message sent through Mychart

## 2017-06-07 NOTE — Telephone Encounter (Signed)
Can we find out briefly why she can't exercise (eg back pain, other physical ailment, etc) so I can put together a note for her.

## 2017-06-17 ENCOUNTER — Encounter: Payer: Self-pay | Admitting: Family Medicine

## 2017-06-18 ENCOUNTER — Encounter: Payer: Self-pay | Admitting: Family Medicine

## 2017-06-19 ENCOUNTER — Ambulatory Visit (HOSPITAL_COMMUNITY)
Admission: EM | Admit: 2017-06-19 | Discharge: 2017-06-19 | Disposition: A | Discharge status: Home / Self Care | Payer: No Typology Code available for payment source | Attending: Family Medicine | Admitting: Family Medicine

## 2017-06-19 ENCOUNTER — Other Ambulatory Visit: Payer: Self-pay

## 2017-06-19 ENCOUNTER — Encounter (HOSPITAL_COMMUNITY): Payer: Self-pay

## 2017-06-19 ENCOUNTER — Encounter: Payer: Self-pay | Admitting: Family Medicine

## 2017-06-19 DIAGNOSIS — J0100 Acute maxillary sinusitis, unspecified: Secondary | ICD-10-CM

## 2017-06-19 MED ORDER — FLUTICASONE PROPIONATE 50 MCG/ACT NA SUSP: 2.0000 | Freq: Every day | NASAL | 0 refills | Status: DC

## 2017-06-19 MED ORDER — DOXYCYCLINE HYCLATE 100 MG PO CAPS: 100.0000 mg | ORAL_CAPSULE | Freq: Two times a day (BID) | ORAL | 0 refills | Status: DC

## 2017-06-19 MED ORDER — CETIRIZINE-PSEUDOEPHEDRINE ER 5-120 MG PO TB12: 1.0000 | ORAL_TABLET | Freq: Every day | ORAL | 0 refills | Status: DC

## 2017-06-19 MED ORDER — BENZONATATE 100 MG PO CAPS: 100.0000 mg | ORAL_CAPSULE | Freq: Three times a day (TID) | ORAL | 0 refills | Status: DC

## 2017-06-19 NOTE — ED Provider Notes (Signed)
[place]    CSN: [id number] Arrival date & time: 06/19/17  [place]     History   Chief Complaint Chief Complaint  Patient presents with  . Cough    HPI [name] is a 47 y.o. female.   47 year old female comes in for 1 week history of URI symptoms.  Has had cough, sore throat, nasal congestion, rhinorrhea, bilateral ear pain. Denies fever, night sweats, but has had some chills. otc cold medicine with some relief. Never smoker.       Past Medical History:  Diagnosis Date  . Chest pain   . GERD (gastroesophageal reflux disease)   . Headache   . Thyroid disease     Patient Active Problem List   Diagnosis Date Noted  . History of prediabetes 04/24/2016  . Iron deficiency anemia 03/12/2015  . Microcytic anemia 01/10/2015  . Thalassemia trait 01/10/2015  . Obesity- s/p GBP surg 05/22/2013  . GERD (gastroesophageal reflux disease) 05/22/2013  . Dyspepsia 04/15/2011  . Hypothyroid 04/15/2011  . Chest pain with minimal risk of acute coronary syndrome   . Obesity hypoventilation syndrome-n C-pap till she had GBP surg 11/24/2009  . HYPOXEMIA 07/29/2009  . ASTHMA 12/09/2007  . SLEEP APNEA, OBSTRUCTIVE 11/14/2007  . ALLERGIC RHINITIS 11/14/2007    Past Surgical History:  Procedure Laterality Date  . BARIATRIC SURGERY    . CHOLECYSTECTOMY    . galbladder    . UMBILICAL HERNIA REPAIR      OB History    No data available       Home Medications    Prior to Admission medications   Medication Sig Start Date End Date Taking? Authorizing Provider  Calcium Citrate-Vitamin D (CALCIUM CITRATE + PO) Take 600 mg by mouth daily.   Yes [provider]  Cholecalciferol (VITAMIN D-3 PO) Take 2,000 Int'l Units by mouth daily.    Yes [provider]  furosemide (LASIX) 20 MG tablet Take 1 tablet (20 mg total) by mouth daily as needed for fluid or edema. 07/22/16  Yes Troy Sine, MD  levothyroxine (SYNTHROID, LEVOTHROID) 125 MCG tablet TAKE  ONE TABLET BY MOUTH ONCE DAILY 12/21/16  Yes Burchette, Alinda Sierras, MD  Multiple Vitamins-Iron (ONE DAILY MULTIVITAMIN/IRON PO) Take 1 tablet by mouth daily.    Yes [provider]  acetaminophen (TYLENOL) 500 MG tablet Take 500 mg by mouth daily as needed for mild pain.    [provider]  benzonatate (TESSALON) 100 MG capsule Take 1 capsule (100 mg total) by mouth every 8 (eight) hours. 06/19/17   Tasia Catchings, Ange Puskas V, PA-C  cetirizine-pseudoephedrine (ZYRTEC-D) 5-120 MG tablet Take 1 tablet by mouth daily. 06/19/17   Tasia Catchings, Sherrey North V, PA-C  doxycycline (VIBRAMYCIN) 100 MG capsule Take 1 capsule (100 mg total) by mouth 2 (two) times daily. Start 2/13 if symptoms not improving 06/23/17   Tasia Catchings, Dietra Stokely V, PA-C  fluticasone (FLONASE) 50 MCG/ACT nasal spray Place 2 sprays into both nostrils daily. 06/19/17   Tasia Catchings, Jovanni Rash V, PA-C  nitroGLYCERIN (NITROSTAT) 0.4 MG SL tablet Place 1 tablet (0.4 mg total) under the tongue every 5 (five) minutes as needed for chest pain. 06/17/15   Darlin Coco, MD  pantoprazole (PROTONIX) 40 MG tablet TAKE 1 TABLET BY MOUTH ONCE DAILY 05/28/17   Troy Sine, MD    Family History Family History  Problem Relation Age of Onset  . Heart disease Father   . Hypertension Father   . Diabetes Father   . Heart attack  Mother   . Diabetes Sister   . Stroke Neg Hx     Social History Social History   Tobacco Use  . Smoking status: Never Smoker  . Smokeless tobacco: Never Used  Substance Use Topics  . Alcohol use: No    Alcohol/week: 0.0 oz  . Drug use: No     Allergies   Augmentin [amoxicillin-pot clavulanate]   Review of Systems Review of Systems  Reason unable to perform ROS: See HPI as above.     Physical Exam Triage Vital Signs ED Triage Vitals  Enc Vitals Group     BP      Pulse      Resp      Temp      Temp src      SpO2      Weight      Height      Head Circumference      Peak Flow      Pain Score      Pain Loc      Pain Edu?      Excl. in [place]?     No data found.  Updated Vital Signs BP (!) 118/95 (BP Location: Left Arm)   Pulse 68   Temp (!) 97.5 F (36.4 C) (Oral)   Resp 16   LMP 06/07/2017 (Exact Date)   SpO2 100%   Physical Exam  Constitutional: She is oriented to person, place, and time. She appears well-developed and well-nourished. No distress.  HENT:  Head: Normocephalic and atraumatic.  Right Ear: Tympanic membrane, external ear and ear canal normal. Tympanic membrane is not erythematous and not bulging.  Left Ear: Tympanic membrane, external ear and ear canal normal. Tympanic membrane is not erythematous and not bulging.  Nose: Mucosal edema and rhinorrhea present. Right sinus exhibits maxillary sinus tenderness. Right sinus exhibits no frontal sinus tenderness. Left sinus exhibits maxillary sinus tenderness. Left sinus exhibits no frontal sinus tenderness.  Mouth/Throat: Uvula is midline and mucous membranes are normal. Posterior oropharyngeal erythema present. No tonsillar exudate.  Eyes: Conjunctivae are normal. Pupils are equal, round, and reactive to light.  Neck: Normal range of motion. Neck supple.  Cardiovascular: Normal rate, regular rhythm and normal heart sounds. Exam reveals no gallop and no friction rub.  No murmur heard. Pulmonary/Chest: Effort normal and breath sounds normal. She has no decreased breath sounds. She has no wheezes. She has no rhonchi. She has no rales.  Lymphadenopathy:    She has no cervical adenopathy.  Neurological: She is alert and oriented to person, place, and time.  Skin: Skin is warm and dry.  Psychiatric: She has a normal mood and affect. Her behavior is normal. Judgment normal.     UC Treatments / Results  Labs (all labs ordered are listed, but only abnormal results are displayed) Labs Reviewed - No data to display  EKG  EKG Interpretation None       Radiology No results found.  Procedures Procedures (including critical care time)  Medications Ordered in  UC Medications - No data to display   Initial Impression / Assessment and Plan / UC Course  I have reviewed the triage vital signs and the nursing notes.  Pertinent labs & imaging results that were available during my care of the patient were reviewed by me and considered in my medical decision making (see chart for details).    History and exam most consistent with acute sinusitis.  Given 7-day history of symptoms, will start with  symptomatic treatment.  Rx of doxycycline called into pharmacy, patient to start on 2/13 if symptoms not improving.  Return precautions given.  Patient expresses understanding and agrees to plan.  Final Clinical Impressions(s) / UC Diagnoses   Final diagnoses:  Acute non-recurrent maxillary sinusitis    ED Discharge Orders        Ordered    doxycycline (VIBRAMYCIN) 100 MG capsule  2 times daily     06/19/17 2039    fluticasone (FLONASE) 50 MCG/ACT nasal spray  Daily     06/19/17 2039    cetirizine-pseudoephedrine (ZYRTEC-D) 5-120 MG tablet  Daily     06/19/17 2039    benzonatate (TESSALON) 100 MG capsule  Every 8 hours     06/19/17 2040       Ok Edwards, PA-C 06/19/17 2303

## 2017-06-19 NOTE — ED Notes (Signed)
Patient discharged by provider.

## 2017-06-19 NOTE — ED Triage Notes (Signed)
Patient presents to [facility] for cough and sore throat x1 weeks, pt has taken OTC medications but has no relief

## 2017-06-19 NOTE — Discharge Instructions (Signed)
Tessalon for cough. Start flonase, zyrtec-D for nasal congestion/drainage. You can use over the counter nasal saline rinse such as neti pot for nasal congestion. Keep hydrated, your urine should be clear to pale yellow in color. Tylenol/motrin for fever and pain. Monitor for any worsening of symptoms, chest pain, shortness of breath, wheezing, swelling of the throat, follow up for reevaluation.   If symptoms not improving, can fill doxycycline on 2/13.  For sore throat try using a honey-based tea. Use 3 teaspoons of honey with juice squeezed from half lemon. Place shaved pieces of ginger into 1/2-1 cup of water and warm over stove top. Then mix the ingredients and repeat every 4 hours as needed.

## 2017-06-21 ENCOUNTER — Encounter: Payer: Self-pay | Admitting: Family Medicine

## 2017-07-21 ENCOUNTER — Ambulatory Visit: Payer: No Typology Code available for payment source | Admitting: Podiatry

## 2017-07-21 VITALS — BP 99/75 | HR 95 | Ht 62.0 in | Wt 239.0 lb

## 2017-07-21 DIAGNOSIS — Q828 Other specified congenital malformations of skin: Secondary | ICD-10-CM

## 2017-07-21 DIAGNOSIS — M722 Plantar fascial fibromatosis: Secondary | ICD-10-CM

## 2017-07-21 DIAGNOSIS — M7751 Other enthesopathy of right foot: Secondary | ICD-10-CM

## 2017-07-21 MED ORDER — MELOXICAM 15 MG PO TABS: 15.0000 mg | ORAL_TABLET | Freq: Every day | ORAL | 1 refills | Status: AC

## 2017-07-21 NOTE — Progress Notes (Signed)
   Subjective:    Patient ID: [name], female    DOB: 08/15/1970, 47 y.o.   MRN: [id number]  HPI  Chief Complaint  Patient presents with  . Callouses    bottom of right foot - she has trimmed it herself in the past but is no longer able to; it has become painful       Review of Systems  All other systems reviewed and are negative.      Objective:   Physical Exam        Assessment & Plan:

## 2017-07-22 NOTE — Progress Notes (Signed)
   Subjective: 47 year old female presenting today as a new patient with a chief complaint of a painful callus lesion to the plantar aspect of the right foot that has been present for several months. She reports trimming the area down herself in the past but states the pain is too great to do so now. She also reports some intermittent right heel pain that is exacerbated by walking for long periods of time. She has not done anything to treat this pain. Patient is here for further evaluation and treatment.   Past Medical History:  Diagnosis Date  . Chest pain   . GERD (gastroesophageal reflux disease)   . Headache   . Thyroid disease      Objective:  Physical Exam General: Alert and oriented x3 in no acute distress  Dermatology: Hyperkeratotic lesion present on the right 3rd toe. Pain on palpation with a central nucleated core noted.  Skin is warm, dry and supple bilateral lower extremities. Negative for open lesions or macerations.  Vascular: Palpable pedal pulses bilaterally. No edema or erythema noted. Capillary refill within normal limits.  Neurological: Epicritic and protective threshold grossly intact bilaterally.   Musculoskeletal Exam: Tenderness to palpation to the plantar aspect of the right heel along the plantar fascia. Pain with palpation noted to the 3rd toe of the right foot. Range of motion within normal limits bilateral. Muscle strength 5/5 in all groups bilateral.  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  #3 Dressed area with light dressing. #4 Injection of 0.5 mLs Celestone Soluspan injected into the plantar fascia of the right foot.  #5 Injection of 0.5 mLs Celestone Soluspan injected into the 3rd toe of the right foot.  #6 Prescription for Meloxicam provided to  patient.  #7 Patient is to return to the clinic PRN.   Edrick Kins, DPM [facility]  Dr. Edrick Kins, [address]                                        [place], [place] [address]                Office [phone]  Fax [phone]

## 2017-08-24 ENCOUNTER — Ambulatory Visit (INDEPENDENT_AMBULATORY_CARE_PROVIDER_SITE_OTHER): Payer: Self-pay | Admitting: Cardiovascular Disease

## 2017-08-24 ENCOUNTER — Encounter: Payer: Self-pay | Admitting: Cardiovascular Disease

## 2017-08-24 VITALS — BP 108/82 | HR 76 | Ht 62.0 in | Wt 246.5 lb

## 2017-08-24 DIAGNOSIS — Z9189 Other specified personal risk factors, not elsewhere classified: Secondary | ICD-10-CM

## 2017-08-24 DIAGNOSIS — R0609 Other forms of dyspnea: Secondary | ICD-10-CM

## 2017-08-24 DIAGNOSIS — R079 Chest pain, unspecified: Secondary | ICD-10-CM

## 2017-08-24 MED ORDER — METOPROLOL TARTRATE 50 MG PO TABS: ORAL_TABLET | ORAL | 0 refills | Status: DC

## 2017-08-24 MED ORDER — NITROGLYCERIN 0.4 MG SL SUBL: 0.4000 mg | SUBLINGUAL_TABLET | SUBLINGUAL | 3 refills | Status: AC | PRN

## 2017-08-24 NOTE — Patient Instructions (Addendum)
Medication Instructions:  Your physician recommends that you continue on your current medications as directed. Please refer to the Current Medication list given to you today.   Labwork: Your physician recommends that you return for lab work: 1 week before your CT for basic metabolic panel    Testing/Procedures: Your physician has requested that you have an echocardiogram. Echocardiography is a painless test that uses sound waves to create images of your heart. It provides your doctor with information about the size and shape of your heart and how well your heart's chambers and valves are working. This procedure takes approximately one hour. There are no restrictions for this procedure.  Cardiac CT scanning, (CAT scanning), is a noninvasive, special x-ray that produces cross-sectional images of the body using x-rays and a computer. CT scans help physicians diagnose and treat medical conditions. For some CT exams, a contrast material is used to enhance visibility in the area of the body being studied. CT scans provide greater clarity and reveal more details than regular x-ray exams.  Please arrive at the [facility] main entrance of [facility] at xx:xx AM (30-45 minutes prior to test start time)  [facility] [place], [place] [address] [phone]  Proceed to the [facility] Radiology Department (First Floor).  Please follow these instructions carefully (unless otherwise directed):  Hold all erectile dysfunction medications at least 48 hours prior to test.  On the Night Before the Test: . Drink plenty of water. . Do not consume any caffeinated/decaffeinated beverages or chocolate 12 hours prior to your test. . Do not take any antihistamines 12 hours prior to your test. . If you take Metformin do not take 24 hours prior to test. . If the patient has contrast allergy: ? Patient will need a prescription for Prednisone and very clear instructions (as  follows): 1. Prednisone 50 mg - take 13 hours prior to test 2. Take another Prednisone 50 mg 7 hours prior to test 3. Take another Prednisone 50 mg 1 hour prior to test 4. Take Benadryl 50 mg 1 hour prior to test . Patient must complete all four doses of above prophylactic medications. . Patient will need a ride after test due to Benadryl.  On the Day of the Test: . Drink plenty of water. Do not drink any water within one hour of the test. . Do not eat any food 4 hours prior to the test. . You may take your regular medications prior to the test. . IF NOT ON A BETA BLOCKER - Take 50 mg of lopressor (metoprolol) one hour before the test. . HOLD Furosemide morning of the test.  After the Test: . Drink plenty of water. . After receiving IV contrast, you may experience a mild flushed feeling. This is normal. . On occasion, you may experience a mild rash up to 24 hours after the test. This is not dangerous. If this occurs, you can take Benadryl 25 mg and increase your fluid intake. . If you experience trouble breathing, this can be serious. If it is severe call 911 IMMEDIATELY. If it is mild, please call our office. . If you take any of these medications: Glipizide/Metformin, Avandament, Glucavance, please do not take 48 hours after completing test.    Follow-Up: Your physician recommends that you schedule a follow-up appointment in: 3 months with Dr. Acie Fredrickson              [facility] Low Glycemic Diet (Source: [employer]  [facility], 2006) Low Glycemic Foods (20-49) (Decrease risk of developing heart disease) Breakfast Cereals: All-Bran All-Bran Fruit 'n Oats Fiber One Oatmeal (not instant) Oat bran Fruits and fruit juices: (Limit to 1-2 servings per day) Apples Apricots (fresh & dried) Blackberries Blueberries Cherries Cranberries Peaches Pears Plums Prunes Grapefruit Raspberries Strawberries Tangerine Apple juice Grapefruit juice Tomato juice Beans and  legumes (fresh-cooked): Black-eyed peas Butter beans Chick peas Lentils  Green beans Lima beans Kidney beans Navy beans Pinto beans Snow peas Non-starchy vegetables: Asparagus, avocado, broccoli, cabbage, cauliflower, celery, cucumber, greens, lettuce, mushrooms, peppers, tomatoes, okra, onions, spinach, summer squash Grains: Barley Bulgur Rye Wild rice Nuts and oils : Almonds Peanuts Sunflower seeds Hazelnuts Pecans Walnuts Oils that are liquid at room temperature Dairy, fish, meat, soy, and eggs: Milk, skim Lowfat cheese Yogurt, lowfat, fruit sugar sweetened Lean red meat Fish  Skinless chicken & [place] Shellfish Egg whites (up to 3 daily) Soy products  Egg yolks (up to 7 or _____ per week) Moderate Glycemic Foods (50-69) Breakfast Cereals: Bran Buds Bran Chex Just Right Mini-Wheats  Special K Swiss muesli Fruits: Banana (under-ripe) Dates Figs Grapes Kiwi Mango Oranges Raisins Fruit Juices: Cranberry juice Orange juice Beans and legumes: Boston-type baked beans Canned pinto, kidney, or navy beans Green peas Vegetables: Beets Carrots  Sweet potato Yam Corn on the cob Breads: Pita (pocket) bread Oat bran bread Pumpernickel bread Rye bread Wheat bread, high fiber  Grains: Cornmeal Rice, brown Rice, white Couscous Pasta: Macaroni Pizza, cheese Ravioli, meat filled Spaghetti, white  Nuts: Cashews Macadamia Snacks: Chocolate Ice cream, lowfat Muffin Popcorn High Glycemic Foods (70-100)  Breakfast Cereals: Cheerios Corn Chex Corn Flakes Cream of Wheat Grape Nuts Grape Nut Flakes Grits Nutri-Grain Puffed Rice Puffed Wheat Rice Chex Rice Krispies Shredded Wheat Team Total Fruits: Pineapple Watermelon Banana (over-ripe) Beverages: Sodas, sweet tea, pineapple juice Vegetables: Potato, baked, boiled, fried, mashed [place] fries Canned or frozen corn Parsnips Winter squash Breads: Most breads (white and whole grain) Bagels Bread sticks Bread stuffing  Kaiser roll Dinner rolls Grains: Rice, instant Tapioca, with milk Candy and most cookies Snacks: Donuts Corn chips Jelly beans Pretzels Pastries              If you need a refill on your cardiac medications before your next appointment, please call your pharmacy.   Thank you for choosing [facility]! Christen Bame, RN [phone]

## 2017-08-24 NOTE — Progress Notes (Signed)
Cardiology Office Note:    Date:  08/24/2017   ID:  [name], DOB 11/24/1970, MRN [id number]  PCP:  Eulas Post, MD  Cardiologist:  Wendelyn Kiesling   Referring MD: Eulas Post, MD   Problem list 1.  Morbid obesity-peak weight of 499 pounds status post gastric bypass and gastric sleeve in 2012. 2.  Atypical chest pain 3.  Hypothyroidism 4.  Beta thalassemia trait  Chief Complaint  Patient presents with  . Chest Pain     History of Present Illness:    [name] is a 47 y.o. female with a hx of morbid obesity and atypical chest pain.  She presents with chest pressure - occurs off and on.  Associated with dyspnea. Previous patient of [facility].  Has tried SL NTG  The pain is a pressure like sensation., radiates to right arm Occurs when she wakes up in the am. Pain will last a few minutes.   Pressure  Also has pain when she lifts heavy objects ( like a case of water or heavy bags.)  Has a previous dx of OSA but lost weight and does not need it any more  Has occasional CP when she brings groceries in from the car.  Has started exercising now.  Is slowly losing weight now  Has had stress test in the past.      Past Medical History:  Diagnosis Date  . Chest pain   . GERD (gastroesophageal reflux disease)   . Headache   . Thyroid disease     Past Surgical History:  Procedure Laterality Date  . BARIATRIC SURGERY    . CHOLECYSTECTOMY    . galbladder    . UMBILICAL HERNIA REPAIR      Current Medications: Current Meds  Medication Sig  . acetaminophen (TYLENOL) 500 MG tablet Take 500 mg by mouth daily as needed for mild pain.  . benzonatate (TESSALON) 100 MG capsule Take 1 capsule (100 mg total) by mouth every 8 (eight) hours.  . Calcium Citrate-Vitamin D (CALCIUM CITRATE + PO) Take 600 mg by mouth daily.  . cetirizine-pseudoephedrine (ZYRTEC-D) 5-120 MG tablet Take 1 tablet by mouth daily.  . Cholecalciferol (VITAMIN D-3 PO) Take 2,000 Int'l Units by  mouth daily.   Marland Kitchen doxycycline (VIBRAMYCIN) 100 MG capsule Take 1 capsule (100 mg total) by mouth 2 (two) times daily. Start 2/13 if symptoms not improving  . fluticasone (FLONASE) 50 MCG/ACT nasal spray Place 2 sprays into both nostrils daily.  . furosemide (LASIX) 20 MG tablet Take 1 tablet (20 mg total) by mouth daily as needed for fluid or edema.  Marland Kitchen levothyroxine (SYNTHROID, LEVOTHROID) 125 MCG tablet TAKE ONE TABLET BY MOUTH ONCE DAILY  . Multiple Vitamins-Iron (ONE DAILY MULTIVITAMIN/IRON PO) Take 1 tablet by mouth daily.   . nitroGLYCERIN (NITROSTAT) 0.4 MG SL tablet Place 1 tablet (0.4 mg total) under the tongue every 5 (five) minutes as needed for chest pain.  . pantoprazole (PROTONIX) 40 MG tablet TAKE 1 TABLET BY MOUTH ONCE DAILY  . phentermine (ADIPEX-P) 37.5 MG tablet TAKE 1/2 TABLET BY MOUTH TWICE DAILY  . [DISCONTINUED] nitroGLYCERIN (NITROSTAT) 0.4 MG SL tablet Place 1 tablet (0.4 mg total) under the tongue every 5 (five) minutes as needed for chest pain.     Allergies:   Augmentin [amoxicillin-pot clavulanate]   Social History   Socioeconomic History  . Marital status: Married    Spouse name: Not on file  . Number of children: Not on file  . Years  of education: Not on file  . Highest education level: Not on file  Occupational History  . Not on file  Social Needs  . Financial resource strain: Not on file  . Food insecurity:    Worry: Not on file    Inability: Not on file  . Transportation needs:    Medical: Not on file    Non-medical: Not on file  Tobacco Use  . Smoking status: Never Smoker  . Smokeless tobacco: Never Used  Substance and Sexual Activity  . Alcohol use: No    Alcohol/week: 0.0 oz  . Drug use: No  . Sexual activity: Never  Lifestyle  . Physical activity:    Days per week: 0 days    Minutes per session: 0 min  . Stress: Only a little  Relationships  . Social connections:    Talks on phone: More than three times a week    Gets together: More  than three times a week    Attends religious service: More than 4 times per year    Active member of club or organization: No    Attends meetings of clubs or organizations: Never    Relationship status: Divorced  Other Topics Concern  . Not on file  Social History Narrative  . Not on file     Family History: The patient's family history includes Diabetes in her father and sister; Heart attack in her mother; Heart disease in her father; Hypertension in her father. There is no history of Stroke.  ROS:   Please see the history of present illness.     All other systems reviewed and are negative.  EKGs/Labs/Other Studies Reviewed:    The following studies were reviewed today: Previous notes from Dr. Claiborne Billings   EKG:  August 24 2017:   NSR at 76.  Poor R wave progression   Recent Labs: 05/07/2017: ALT 18; BUN 7; Creatinine, Ser 0.54; Hemoglobin 12.3; Platelets 244.0; Potassium 4.1; Sodium 137; TSH 3.21  Recent Lipid Panel    Component Value Date/Time   CHOL 128 05/07/2017 1107   TRIG 108.0 05/07/2017 1107   HDL 36.30 (L) 05/07/2017 1107   CHOLHDL 4 05/07/2017 1107   VLDL 21.6 05/07/2017 1107   [place] 70 05/07/2017 1107    Physical Exam:    VS:  BP 108/82   Pulse 76   Ht 5\' 2"  (1.575 m)   Wt 246 lb 8 oz (111.8 kg)   SpO2 98%   BMI 45.09 kg/m     Wt Readings from Last 3 Encounters:  08/24/17 246 lb 8 oz (111.8 kg)  07/21/17 239 lb (108.4 kg)  05/07/17 293 lb 8 oz (133.1 kg)     GEN: middle age, obese female  HEENT: Normal NECK: No JVD; No carotid bruits LYMPHATICS: No lymphadenopathy CARDIAC: RRR, no murmurs, rubs, gallops RESPIRATORY:  Clear to auscultation without rales, wheezing or rhonchi  ABDOMEN:  obese, soft, good bowel sounds. MUSCULOSKELETAL:  No edema; No deformity  SKIN: Warm and dry NEUROLOGIC:  Alert and oriented x 3 PSYCHIATRIC:  Normal affect   ASSESSMENT:    1. DOE (dyspnea on exertion)   2. Chest pain with minimal risk of acute coronary  syndrome   3. Chest pain, unspecified type    PLAN:    In order of problems listed above:  1. Chest pain: Patient has a long history of chest discomfort.  She is been treated with nitroglycerin on an as-needed basis.  This seems to provide her some  relief.  She has had a stress test which did not reveal any ischemia.  It is possible that she may have esophageal spasm. She does have numerous risk factors for coronary artery disease.  We will schedule her for a coronary CT angiogram.  2.  Shortness of breath with exertion.  She has a high likelihood of having diastolic dysfunction.  We will get an echocardiogram for further evaluation.  In addition, will want to rule out pulmonary hypertension.  Her mother had a history of markedly elevated pulmonary pressures.   Medication Adjustments/Labs and Tests Ordered: Current medicines are reviewed at length with the patient today.  Concerns regarding medicines are outlined above.  Orders Placed This Encounter  Procedures  . EKG 12-Lead  . ECHOCARDIOGRAM COMPLETE   Meds ordered this encounter  Medications  . nitroGLYCERIN (NITROSTAT) 0.4 MG SL tablet    Sig: Place 1 tablet (0.4 mg total) under the tongue every 5 (five) minutes as needed for chest pain.    Dispense:  25 tablet    Refill:  3  . metoprolol tartrate (LOPRESSOR) 50 MG tablet    Sig: Take 1 hour before your coronary CT    Dispense:  1 tablet    Refill:  0       Signed, Mertie Moores, MD  08/24/2017 9:47 AM    [place]

## 2017-09-06 ENCOUNTER — Telehealth (HOSPITAL_COMMUNITY): Payer: Self-pay | Admitting: Cardiovascular Disease

## 2017-09-07 ENCOUNTER — Other Ambulatory Visit: Payer: Self-pay

## 2017-09-07 ENCOUNTER — Ambulatory Visit (HOSPITAL_COMMUNITY): Payer: Self-pay | Attending: Cardiology

## 2017-09-07 DIAGNOSIS — D649 Anemia, unspecified: Secondary | ICD-10-CM | POA: Insufficient documentation

## 2017-09-07 DIAGNOSIS — R0609 Other forms of dyspnea: Secondary | ICD-10-CM | POA: Insufficient documentation

## 2017-09-07 DIAGNOSIS — R079 Chest pain, unspecified: Secondary | ICD-10-CM | POA: Insufficient documentation

## 2017-09-07 DIAGNOSIS — G4733 Obstructive sleep apnea (adult) (pediatric): Secondary | ICD-10-CM | POA: Insufficient documentation

## 2017-09-07 MED ORDER — PERFLUTREN LIPID MICROSPHERE
1.0000 mL | INTRAVENOUS | Status: AC | PRN
  Administered 2017-09-07: 2 mL via INTRAVENOUS

## 2017-09-10 NOTE — Telephone Encounter (Signed)
User: Cherie Dark A Date/time: 09/06/17 11:15 AM  Comment: Returned patient call and lmsg for her to CB..  Context:  Outcome: Left Message  Phone number: [phone] Phone Type: Home Phone  Comm. type: Telephone Call type: Outgoing  Contact: [name] Relation to patient: Self

## 2017-09-16 ENCOUNTER — Encounter: Payer: Self-pay | Admitting: Hematology and Oncology

## 2017-09-16 NOTE — Progress Notes (Signed)
Patient came in wanting to know when her financial assistance expires.   Reviewed information in media tab and reprinted letter for her with expiration date and circled phone number to customer service for any additional questions regarding her re-application.

## 2017-09-27 ENCOUNTER — Other Ambulatory Visit: Payer: Self-pay | Admitting: *Deleted

## 2017-09-27 DIAGNOSIS — R0609 Other forms of dyspnea: Principal | ICD-10-CM

## 2017-09-27 DIAGNOSIS — R079 Chest pain, unspecified: Secondary | ICD-10-CM

## 2017-09-27 LAB — BASIC METABOLIC PANEL
BUN/Creatinine Ratio: 9 (ref 9–23)
BUN: 5 mg/dL — AB (ref 6–24)
CALCIUM: 8.9 mg/dL (ref 8.7–10.2)
CHLORIDE: 100 mmol/L (ref 96–106)
CO2: 23 mmol/L (ref 20–29)
Creatinine, Ser: 0.54 mg/dL — ABNORMAL LOW (ref 0.57–1.00)
GFR calc non Af Amer: 113 mL/min/{1.73_m2} (ref >59)
GFR, EST AFRICAN AMERICAN: 130 mL/min/{1.73_m2} (ref >59)
Glucose: 142 mg/dL — ABNORMAL HIGH (ref 65–99)
POTASSIUM: 3.5 mmol/L (ref 3.5–5.2)
Sodium: 138 mmol/L (ref 134–144)

## 2017-10-01 ENCOUNTER — Ambulatory Visit (HOSPITAL_COMMUNITY): Payer: Self-pay

## 2017-10-01 ENCOUNTER — Encounter (HOSPITAL_COMMUNITY): Payer: Self-pay

## 2017-10-01 ENCOUNTER — Ambulatory Visit (HOSPITAL_COMMUNITY)
Admission: RE | Admit: 2017-10-01 | Discharge: 2017-10-01 | Disposition: A | Discharge status: Home / Self Care | Payer: Self-pay | Source: Ambulatory Visit | Attending: Cardiovascular Disease | Admitting: Cardiovascular Disease

## 2017-10-01 DIAGNOSIS — R079 Chest pain, unspecified: Secondary | ICD-10-CM

## 2017-10-01 DIAGNOSIS — R0609 Other forms of dyspnea: Secondary | ICD-10-CM | POA: Insufficient documentation

## 2017-10-01 MED ORDER — METOPROLOL TARTRATE 5 MG/5ML IV SOLN
INTRAVENOUS | Status: AC
  Administered 2017-10-01: 5 mg
  Filled 2017-10-01: qty 75

## 2017-10-01 MED ORDER — NITROGLYCERIN 0.4 MG SL SUBL
0.8000 mg | SUBLINGUAL_TABLET | SUBLINGUAL | Status: DC | PRN
  Administered 2017-10-01: 0.8 mg via SUBLINGUAL

## 2017-10-01 MED ORDER — IOPAMIDOL (ISOVUE-370) INJECTION 76%
INTRAVENOUS | Status: AC
  Filled 2017-10-01: qty 100

## 2017-10-01 MED ORDER — NITROGLYCERIN 0.4 MG SL SUBL
SUBLINGUAL_TABLET | SUBLINGUAL | Status: AC
  Administered 2017-10-01: 0.4 mg
  Filled 2017-10-01: qty 1

## 2017-10-01 MED ORDER — METOPROLOL TARTRATE 5 MG/5ML IV SOLN
5.0000 mg | INTRAVENOUS | Status: DC | PRN
  Administered 2017-10-01: 5 mg via INTRAVENOUS

## 2017-10-01 MED ORDER — IOPAMIDOL (ISOVUE-370) INJECTION 76%
100.0000 mL | Freq: Once | INTRAVENOUS | Status: AC | PRN
  Administered 2017-10-01: 100 mL via INTRAVENOUS

## 2017-10-30 ENCOUNTER — Encounter: Payer: Self-pay | Admitting: Family Medicine

## 2017-11-04 ENCOUNTER — Other Ambulatory Visit: Payer: Self-pay | Admitting: Cardiovascular Disease

## 2017-11-08 ENCOUNTER — Ambulatory Visit: Payer: Self-pay | Admitting: Family Medicine

## 2017-11-26 ENCOUNTER — Encounter: Payer: Self-pay | Admitting: Cardiovascular Disease

## 2017-11-26 ENCOUNTER — Ambulatory Visit (INDEPENDENT_AMBULATORY_CARE_PROVIDER_SITE_OTHER): Payer: Self-pay | Admitting: Cardiovascular Disease

## 2017-11-26 VITALS — BP 102/78 | HR 62 | Ht 62.0 in | Wt 253.1 lb

## 2017-11-26 DIAGNOSIS — R0609 Other forms of dyspnea: Secondary | ICD-10-CM

## 2017-11-26 DIAGNOSIS — R079 Chest pain, unspecified: Secondary | ICD-10-CM

## 2017-11-26 NOTE — Progress Notes (Signed)
Cardiology Office Note:    Date:  11/26/2017   ID:  [name], DOB 1970/07/06, MRN [id number]  PCP:  Rachael Post, MD  Cardiologist:  [name]   Referring MD: Rachael Post, MD   Problem list 1.  Morbid obesity-peak weight of 499 pounds status [name] gastric bypass and gastric sleeve in 2012. 2.  Atypical chest pain 3.  Hypothyroidism 4.  Beta thalassemia trait  Chief Complaint  Patient presents with  . Shortness of Breath     History of Present Illness:    [name] is a 47 y.o. female with a hx of morbid obesity and atypical chest pain.  She presents with chest pressure - occurs off and on.  Associated with dyspnea. Previous patient of [name].  Has tried SL NTG  The pain is a pressure like sensation., radiates to right arm Occurs when she wakes up in the am. Pain will last a few minutes.   Pressure  Also has pain when she lifts heavy objects ( like a case of water or heavy bags.)  Has a previous dx of OSA but lost weight and does not need it any more  Has occasional CP when she brings groceries in from the car.  Has started exercising now.  Is slowly losing weight now  Has had stress test in the past.    November 26, 2017:  No edema seen today for follow-up of her shortness of breath. She had a coronary CT angiogram which showed normal coronary arteries.  Her coronary calcium score is 0. Echocardiogram shows normal left ventricular systolic and diastolic function.  Had gastric bypass surgery in 2011.   Has been gaining weight recently  Is not eatign well.  Craving junk food.   Has lots of fatigue     Past Medical History:  Diagnosis Date  . Chest pain   . GERD (gastroesophageal reflux disease)   . Headache   . Thyroid disease     Past Surgical History:  Procedure Laterality Date  . BARIATRIC SURGERY    . CHOLECYSTECTOMY    . galbladder    . UMBILICAL HERNIA REPAIR      Current Medications: Current Meds  Medication Sig  .  acetaminophen (TYLENOL) 500 MG tablet Take 500 mg by mouth daily as needed for mild pain.  . benzonatate (TESSALON) 100 MG capsule Take 1 capsule (100 mg total) by mouth every 8 (eight) hours.  . Calcium Citrate-Vitamin D (CALCIUM CITRATE + PO) Take 600 mg by mouth daily.  . cetirizine-pseudoephedrine (ZYRTEC-D) 5-120 MG tablet Take 1 tablet by mouth daily.  . Cholecalciferol (VITAMIN D-3 PO) Take 2,000 Int'l Units by mouth daily.   . fluticasone (FLONASE) 50 MCG/ACT nasal spray Place 2 sprays into both nostrils daily.  . furosemide (LASIX) 20 MG tablet TAKE 1 TABLET BY MOUTH ONCE DAILY AS NEEDED FOR FLUID OR EDEMA  . levothyroxine (SYNTHROID, LEVOTHROID) 125 MCG tablet TAKE ONE TABLET BY MOUTH ONCE DAILY  . Multiple Vitamins-Iron (ONE DAILY MULTIVITAMIN/IRON PO) Take 1 tablet by mouth daily.   . nitroGLYCERIN (NITROSTAT) 0.4 MG SL tablet Place 1 tablet (0.4 mg total) under the tongue every 5 (five) minutes as needed for chest pain.  . pantoprazole (PROTONIX) 40 MG tablet TAKE 1 TABLET BY MOUTH ONCE DAILY  . phentermine (ADIPEX-P) 37.5 MG tablet TAKE 1/2 TABLET BY MOUTH TWICE DAILY     Allergies:   Augmentin [amoxicillin-pot clavulanate]   Social History   Socioeconomic History  .  Marital status: Divorced    Spouse name: Not on file  . Number of children: Not on file  . Years of education: Not on file  . Highest education level: Not on file  Occupational History  . Not on file  Social Needs  . Financial resource strain: Not on file  . Food insecurity:    Worry: Not on file    Inability: Not on file  . Transportation needs:    Medical: Not on file    Non-medical: Not on file  Tobacco Use  . Smoking status: Never Smoker  . Smokeless tobacco: Never Used  Substance and Sexual Activity  . Alcohol use: No    Alcohol/week: 0.0 oz  . Drug use: No  . Sexual activity: Never  Lifestyle  . Physical activity:    Days per week: 0 days    Minutes per session: 0 min  . Stress: Only a  little  Relationships  . Social connections:    Talks on phone: More than three times a week    Gets together: More than three times a week    Attends religious service: More than 4 times per year    Active member of club or organization: No    Attends meetings of clubs or organizations: Never    Relationship status: Divorced  Other Topics Concern  . Not on file  Social History Narrative  . Not on file     Family History: The patient's family history includes Diabetes in her father and sister; Heart attack in her mother; Heart disease in her father; Hypertension in her father. There is no history of Stroke.  ROS:   Please see the history of present illness.     All other systems reviewed and are negative.  EKGs/Labs/Other Studies Reviewed:    The following studies were reviewed today: Previous notes from Dr. Claiborne [name]   EKG:  August 24 2017:   NSR at 76.  Poor R wave progression   Recent Labs: 05/07/2017: ALT 18; Hemoglobin 12.3; Platelets 244.0; TSH 3.21 09/27/2017: BUN 5; Creatinine, Ser 0.54; Potassium 3.5; Sodium 138  Recent Lipid Panel    Component Value Date/Time   CHOL 128 05/07/2017 1107   TRIG 108.0 05/07/2017 1107   HDL 36.30 (L) 05/07/2017 1107   CHOLHDL 4 05/07/2017 1107   VLDL 21.6 05/07/2017 1107   LDLCALC 70 05/07/2017 1107    Physical Exam:    Physical Exam: Blood pressure 102/78, pulse 62, height 5\' 2"  (1.575 m), weight 253 lb 1.9 oz (114.8 kg), SpO2 97 %.  GEN:  Middle age, morbidly obese female  HEENT: Normal NECK: No JVD; No carotid bruits LYMPHATICS: No lymphadenopathy CARDIAC: RR  RESPIRATORY:  Clear to auscultation without rales, wheezing or rhonchi  ABDOMEN: Soft, non-tender, non-distended MUSCULOSKELETAL:  No edema; No deformity  SKIN: Warm and dry NEUROLOGIC:  Alert and oriented x 3   ASSESSMENT:    No diagnosis found. PLAN:    In order of problems listed above:  1.  Chest pain: Patient has a long history of chest discomfort.    . Coronary CT angiogram shows normal coronaries.   2.  Shortness of breath with exertion.    Echo shows normal LV systolic and diastolic function   3.  Morbid obesity :  She wants to lose weight.   I agree that the most likely cause of her shortness of breath is her morbid obesity. I encouraged her to get back with her general medical doctor. At  this Point there is nothing further I can add from a cardiac standpoint.  I will see her on an as-needed basis.  Medication Adjustments/Labs and Tests Ordered: Current medicines are reviewed at length with the patient today.  Concerns regarding medicines are outlined above.  No orders of the defined types were placed in this encounter.  No orders of the defined types were placed in this encounter.      Signed, Mertie Moores, MD  11/26/2017 11:55 AM    [place]

## 2017-11-26 NOTE — Patient Instructions (Signed)

## 2018-04-25 ENCOUNTER — Other Ambulatory Visit: Payer: Self-pay

## 2018-04-25 ENCOUNTER — Inpatient Hospital Stay: Payer: Self-pay | Attending: Hematology and Oncology

## 2018-04-25 DIAGNOSIS — D509 Iron deficiency anemia, unspecified: Secondary | ICD-10-CM

## 2018-04-25 DIAGNOSIS — D500 Iron deficiency anemia secondary to blood loss (chronic): Secondary | ICD-10-CM

## 2018-04-25 DIAGNOSIS — Z79899 Other long term (current) drug therapy: Secondary | ICD-10-CM | POA: Insufficient documentation

## 2018-04-25 DIAGNOSIS — D561 Beta thalassemia: Secondary | ICD-10-CM | POA: Insufficient documentation

## 2018-04-25 LAB — CBC WITH DIFFERENTIAL (CANCER CENTER ONLY)
Abs Immature Granulocytes: 0.05 10*3/uL (ref 0.00–0.07)
BASOS ABS: 0.1 10*3/uL (ref 0.0–0.1)
Basophils Relative: 1 %
EOS PCT: 2 %
Eosinophils Absolute: 0.2 10*3/uL (ref 0.0–0.5)
HCT: 41.6 % (ref 36.0–46.0)
Hemoglobin: 12 g/dL (ref 12.0–15.0)
Immature Granulocytes: 0 %
LYMPHS PCT: 31 %
Lymphs Abs: 3.9 10*3/uL (ref 0.7–4.0)
MCH: 18.4 pg — ABNORMAL LOW (ref 26.0–34.0)
MCHC: 28.8 g/dL — AB (ref 30.0–36.0)
MCV: 63.8 fL — ABNORMAL LOW (ref 80.0–100.0)
Monocytes Absolute: 0.6 10*3/uL (ref 0.1–1.0)
Monocytes Relative: 5 %
NRBC: 0 % (ref 0.0–0.2)
Neutro Abs: 7.9 10*3/uL — ABNORMAL HIGH (ref 1.7–7.7)
Neutrophils Relative %: 61 %
Platelet Count: 269 10*3/uL (ref 150–400)
RBC: 6.52 MIL/uL — AB (ref 3.87–5.11)
RDW: 18.3 % — AB (ref 11.5–15.5)
WBC: 12.8 10*3/uL — AB (ref 4.0–10.5)

## 2018-04-26 ENCOUNTER — Inpatient Hospital Stay: Payer: Self-pay | Admitting: Hematology and Oncology

## 2018-04-26 ENCOUNTER — Telehealth: Payer: Self-pay | Admitting: Hematology and Oncology

## 2018-04-26 LAB — IRON AND TIBC
Iron: 26 ug/dL — ABNORMAL LOW (ref 41–142)
SATURATION RATIOS: 8 % — AB (ref 21–57)
TIBC: 339 ug/dL (ref 236–444)
UIBC: 313 ug/dL (ref 120–384)

## 2018-04-26 LAB — FERRITIN: Ferritin: 67 ng/mL (ref 11–307)

## 2018-04-26 NOTE — Telephone Encounter (Signed)
R/s appt per 12/17 sch message - pt is aware of apt date and time

## 2018-04-26 NOTE — Assessment & Plan Note (Signed)
Irondeficiency anemia due to chronic blood loss: Iron studies were reviewed and she does not have any evidence of iron deficiency. 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  Lab review: Hemoglobin 12, MCV 63.8, RDW 18.3  Palpable lump in the right breast: Mammogram 06/04/2017: Benign  Return to clinic 1 year for follow-up

## 2018-05-04 NOTE — Assessment & Plan Note (Signed)
Irondeficiency anemia due to chronic blood loss: Iron studies were reviewed and she does not have any evidence of iron deficiency. 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  Lab review: Hemoglobin 12, MCV 63.8, RDW 18.3  Palpable lump in the right breast: Mammogram 06/04/2017: Benign  Return to clinic 1 year for follow-up

## 2018-05-05 ENCOUNTER — Telehealth: Payer: Self-pay | Admitting: Hematology and Oncology

## 2018-05-05 ENCOUNTER — Inpatient Hospital Stay (HOSPITAL_BASED_OUTPATIENT_CLINIC_OR_DEPARTMENT_OTHER): Payer: Self-pay | Admitting: Hematology and Oncology

## 2018-05-05 DIAGNOSIS — Z79899 Other long term (current) drug therapy: Secondary | ICD-10-CM

## 2018-05-05 DIAGNOSIS — D500 Iron deficiency anemia secondary to blood loss (chronic): Secondary | ICD-10-CM

## 2018-05-05 DIAGNOSIS — D561 Beta thalassemia: Secondary | ICD-10-CM

## 2018-05-05 MED ORDER — FUROSEMIDE 20 MG PO TABS: ORAL_TABLET | ORAL | 3 refills | Status: DC

## 2018-05-05 NOTE — Telephone Encounter (Signed)
Scheduled appt per 12/26 los - gave patient AVS And calender per los.

## 2018-05-05 NOTE — Progress Notes (Signed)
Patient Care Team: Eulas Post, MD as PCP - General (Family Medicine) Nahser, Wonda Cheng, MD as PCP - Cardiology (Cardiology)  DIAGNOSIS:  Encounter Diagnosis  Name Primary?  . Iron deficiency anemia due to chronic blood loss      CHIEF COMPLIANT: Follow-up of iron deficiency anemia  INTERVAL HISTORY: [name] is a 65-year with above-mentioned staff iron deficiency anemia who is here for routine follow-up.  She had blood work done recently and is here to discuss results.  She has been feeling significantly tired and lack of energy for the past 3 to 4 days.  She is also having issues with increased leg swelling because she ran out of Lasix.  REVIEW OF SYSTEMS:   Constitutional: Denies fevers, chills or abnormal weight loss Eyes: Denies blurriness of vision Ears, nose, mouth, throat, and face: Denies mucositis or sore throat Respiratory: Denies cough, dyspnea or wheezes Cardiovascular: Denies palpitation, chest discomfort Gastrointestinal:  Denies nausea, heartburn or change in bowel habits Skin: Denies abnormal skin rashes Lymphatics: Denies new lymphadenopathy or easy bruising Neurological:Denies numbness, tingling or new weaknesses Behavioral/Psych: Mood is stable, no new changes  Extremities: 2+ lower extremity edema  All other systems were reviewed with the patient and are negative.  I have reviewed the past medical history, past surgical history, social history and family history with the patient and they are unchanged from previous note.  ALLERGIES:  is allergic to augmentin [amoxicillin-pot clavulanate].  MEDICATIONS:  Current Outpatient Medications  Medication Sig Dispense Refill  . acetaminophen (TYLENOL) 500 MG tablet Take 500 mg by mouth daily as needed for mild pain.    . benzonatate (TESSALON) 100 MG capsule Take 1 capsule (100 mg total) by mouth every 8 (eight) hours. 21 capsule 0  . Calcium Citrate-Vitamin D (CALCIUM CITRATE + PO) Take 600 mg by mouth  daily.    . cetirizine-pseudoephedrine (ZYRTEC-D) 5-120 MG tablet Take 1 tablet by mouth daily. 15 tablet 0  . Cholecalciferol (VITAMIN D-3 PO) Take 2,000 Int'l Units by mouth daily.     . fluticasone (FLONASE) 50 MCG/ACT nasal spray Place 2 sprays into both nostrils daily. 1 g 0  . furosemide (LASIX) 20 MG tablet TAKE 1 TABLET BY MOUTH ONCE DAILY AS NEEDED FOR FLUID OR EDEMA 30 tablet 3  . levothyroxine (SYNTHROID, LEVOTHROID) 125 MCG tablet TAKE ONE TABLET BY MOUTH ONCE DAILY 90 tablet 1  . Multiple Vitamins-Iron (ONE DAILY MULTIVITAMIN/IRON PO) Take 1 tablet by mouth daily.     . nitroGLYCERIN (NITROSTAT) 0.4 MG SL tablet Place 1 tablet (0.4 mg total) under the tongue every 5 (five) minutes as needed for chest pain. 25 tablet 3  . pantoprazole (PROTONIX) 40 MG tablet TAKE 1 TABLET BY MOUTH ONCE DAILY 90 tablet 3  . phentermine (ADIPEX-P) 37.5 MG tablet TAKE 1/2 TABLET BY MOUTH TWICE DAILY     No current facility-administered medications for this visit.     PHYSICAL EXAMINATION: ECOG PERFORMANCE STATUS: 1 - Symptomatic but completely ambulatory  Vitals:   05/05/18 0900  BP: 122/68  Pulse: 67  Resp: 18  Temp: 98.8 F (37.1 C)  SpO2: 99%   Filed Weights   05/05/18 0900  Weight: 259 lb 3.2 oz (117.6 kg)    GENERAL:alert, no distress and comfortable SKIN: skin color, texture, turgor are normal, no rashes or significant lesions EYES: normal, Conjunctiva are pink and non-injected, sclera clear OROPHARYNX:no exudate, no erythema and lips, buccal mucosa, and tongue normal  NECK: supple, thyroid normal  size, non-tender, without nodularity LYMPH:  no palpable lymphadenopathy in the cervical, axillary or inguinal LUNGS: clear to auscultation and percussion with normal breathing effort HEART: regular rate & rhythm and no murmurs and no lower extremity edema ABDOMEN:abdomen soft, non-tender and normal bowel sounds MUSCULOSKELETAL:no cyanosis of digits and no clubbing  NEURO: alert &  oriented x 3 with fluent speech, no focal motor/sensory deficits EXTREMITIES: 2+ bilateral lower extremity edema   LABORATORY DATA:  I have reviewed the data as listed CMP Latest Ref Rng & Units 09/27/2017 05/07/2017 06/28/2014  Glucose 65 - 99 mg/dL 142(H) 83 91  BUN 6 - 24 mg/dL 5(L) 7 7  Creatinine 0.57 - 1.00 mg/dL 0.54(L) 0.54 0.47  Sodium 134 - 144 mmol/L 138 137 137  Potassium 3.5 - 5.2 mmol/L 3.5 4.1 3.5  Chloride 96 - 106 mmol/L 100 102 105  CO2 20 - 29 mmol/L 23 29 30   Calcium 8.7 - 10.2 mg/dL 8.9 8.4 8.3(L)  Total Protein 6.0 - 8.3 g/dL - 7.3 7.4  Total Bilirubin 0.2 - 1.2 mg/dL - 0.4 0.3  Alkaline Phos 39 - 117 U/L - 76 89  AST 0 - 37 U/L - 13 15  ALT 0 - 35 U/L - 18 11    Lab Results  Component Value Date   WBC 12.8 (H) 04/25/2018   HGB 12.0 04/25/2018   HCT 41.6 04/25/2018   MCV 63.8 (L) 04/25/2018   PLT 269 04/25/2018   NEUTROABS 7.9 (H) 04/25/2018    ASSESSMENT & PLAN:  Iron deficiency anemia Irondeficiency anemia due to chronic blood loss: Iron studies were reviewed and she does not have any evidence of iron deficiency. 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  Lab review: 04/25/2018: Hemoglobin 12, MCV 63.8, ferritin 67, iron saturation 8% Patient does feel fatigued and decreased energy.  However I do not know that giving IV iron is going to solve the problem. So we decided to see her back in 6 months with labs.  If her symptoms continue to persist then we may consider giving IV iron based on 8% iron saturation.  Patient has been gaining significant weight because she ran out of Lasix and her cardiologist is on vacation.  So I sent a new prescription for Lasix to her [place].  Palpable lump in the right breast: Mammogram 06/04/2017: Benign  Return to clinic 6 months for follow-up with labs done ahead of time.    No orders of the defined types were placed in this  encounter.  The patient has a good understanding of the overall plan. she agrees with it. she will call with any problems that may develop before the next visit here.   Harriette Ohara, MD 05/05/18

## 2018-05-26 ENCOUNTER — Ambulatory Visit (INDEPENDENT_AMBULATORY_CARE_PROVIDER_SITE_OTHER): Payer: Self-pay | Admitting: Internal Medicine

## 2018-05-26 ENCOUNTER — Encounter: Payer: Self-pay | Admitting: Internal Medicine

## 2018-05-26 VITALS — BP 124/80 | HR 73 | Temp 98.5°F | Wt 267.4 lb

## 2018-05-26 DIAGNOSIS — Z23 Encounter for immunization: Secondary | ICD-10-CM

## 2018-05-26 DIAGNOSIS — R6889 Other general symptoms and signs: Secondary | ICD-10-CM

## 2018-05-26 DIAGNOSIS — J069 Acute upper respiratory infection, unspecified: Secondary | ICD-10-CM

## 2018-05-26 LAB — POC INFLUENZA A&B (BINAX/QUICKVUE)
Influenza A, POC: NEGATIVE
Influenza B, POC: NEGATIVE

## 2018-05-26 NOTE — Progress Notes (Signed)
Acute Office Visit     CC/Reason for Visit: Cough, runny nose, congestion  HPI: [name] is a 48 y.o. female who is coming in today for the above mentioned reasons.  3 days ago patient started having a nonproductive cough that wakes her up from sleep, runny nose and significant nasal congestion.  She also describes chills, however no fever, she has had body aches and headaches but no nausea or vomiting.  She also describes some bilateral ear pain and a "popping sensation of my ears" however no sore throat.  Her dad has been sick and was recently diagnosed with influenza at urgent care.  Past Medical/Surgical History: Past Medical History:  Diagnosis Date  . Chest pain   . GERD (gastroesophageal reflux disease)   . Headache   . Thyroid disease     Past Surgical History:  Procedure Laterality Date  . BARIATRIC SURGERY    . CHOLECYSTECTOMY    . galbladder    . UMBILICAL HERNIA REPAIR      Social History:  reports that she has never smoked. She has never used smokeless tobacco. She reports that she does not drink alcohol or use drugs.  Allergies: Allergies  Allergen Reactions  . Augmentin [Amoxicillin-Pot Clavulanate] Shortness Of Breath    Family History:  Family History  Problem Relation Age of Onset  . Heart disease Father   . Hypertension Father   . Diabetes Father   . Heart attack Mother   . Diabetes Sister   . Stroke Neg Hx      Current Outpatient Medications:  .  acetaminophen (TYLENOL) 500 MG tablet, Take 500 mg by mouth daily as needed for mild pain., Disp: , Rfl:  .  benzonatate (TESSALON) 100 MG capsule, Take 1 capsule (100 mg total) by mouth every 8 (eight) hours., Disp: 21 capsule, Rfl: 0 .  Calcium Citrate-Vitamin D (CALCIUM CITRATE + PO), Take 600 mg by mouth daily., Disp: , Rfl:  .  cetirizine-pseudoephedrine (ZYRTEC-D) 5-120 MG tablet, Take 1 tablet by mouth daily., Disp: 15 tablet, Rfl: 0 .  Cholecalciferol (VITAMIN D-3 PO), Take 2,000  Int'l Units by mouth daily. , Disp: , Rfl:  .  fluticasone (FLONASE) 50 MCG/ACT nasal spray, Place 2 sprays into both nostrils daily., Disp: 1 g, Rfl: 0 .  furosemide (LASIX) 20 MG tablet, TAKE 1 TABLET BY MOUTH ONCE DAILY AS NEEDED FOR FLUID OR EDEMA, Disp: 30 tablet, Rfl: 3 .  levothyroxine (SYNTHROID, LEVOTHROID) 125 MCG tablet, TAKE ONE TABLET BY MOUTH ONCE DAILY, Disp: 90 tablet, Rfl: 1 .  Multiple Vitamins-Iron (ONE DAILY MULTIVITAMIN/IRON PO), Take 1 tablet by mouth daily. , Disp: , Rfl:  .  nitroGLYCERIN (NITROSTAT) 0.4 MG SL tablet, Place 1 tablet (0.4 mg total) under the tongue every 5 (five) minutes as needed for chest pain., Disp: 25 tablet, Rfl: 3 .  pantoprazole (PROTONIX) 40 MG tablet, TAKE 1 TABLET BY MOUTH ONCE DAILY, Disp: 90 tablet, Rfl: 3 .  phentermine (ADIPEX-P) 37.5 MG tablet, TAKE 1/2 TABLET BY MOUTH TWICE DAILY, Disp: , Rfl:   Review of Systems:  Constitutional: Denies fever, positive for chills, diaphoresis, appetite change and fatigue.  HEENT: Denies photophobia, eye pain, redness, sore throat, hearing loss, positive for ear pain, congestion, rhinorrhea, Respiratory: Denies SOB, DOE, cough, chest tightness,  and wheezing.   Cardiovascular: Denies chest pain, palpitations and leg swelling.  Gastrointestinal: Denies nausea, vomiting, abdominal pain, diarrhea, constipation, blood in stool and abdominal distention.  Genitourinary: Denies  dysuria, urgency, frequency, hematuria, flank pain and difficulty urinating.  Endocrine: Denies: hot or cold intolerance, sweats, changes in hair or nails, polyuria, polydipsia. Musculoskeletal: Denies myalgias, back pain, joint swelling, arthralgias and gait problem.  Skin: Denies pallor, rash and wound.  Neurological: Denies dizziness, seizures, syncope, weakness, light-headedness, numbness and headaches.  Hematological: Denies adenopathy. Easy bruising, personal or family bleeding history  Psychiatric/Behavioral: Denies suicidal  ideation, mood changes, confusion, nervousness, sleep disturbance and agitation    Physical Exam: Vitals:   05/26/18 1058  BP: 124/80  Pulse: 73  Temp: 98.5 F (36.9 C)  TempSrc: Oral  SpO2: 97%  Weight: 267 lb 6.4 oz (121.3 kg)    Body mass index is 48.91 kg/m.   Constitutional: NAD, calm, comfortable Eyes: PERRL, lids and conjunctivae normal ENMT: Mucous membranes are moist. Posterior pharynx is erythematous clear of any exudate or lesions. Normal dentition. Tympanic membrane is pearly white, no erythema or bulging. Neck: normal, supple, no masses, no thyromegaly Respiratory: clear to auscultation bilaterally, no wheezing, no crackles. Normal respiratory effort. No accessory muscle use.  Cardiovascular: Regular rate and rhythm, no murmurs / rubs / gallops. No extremity edema. 2+ pedal pulses. No carotid bruits.  Psychiatric: Normal judgment and insight. Alert and oriented x 3. Normal mood.    Impression and Plan:  Flu-like symptoms  Viral upper respiratory tract infection -Flu swab is negative. -Advised over-the-counter cold medication and cough suppressants. -Believe no need for antibiotics at present for Tamiflu. -Advised to follow-up with no improvement within 10 to 14 days.  Need for immunization against influenza - Plan: Flu Vaccine QUAD 36+ mos IM -Patient is requesting flu vaccine despite acute illness, will give since not contraindicated.    Patient Instructions  -It was nice meeting you today!  -Flu shot today per your request.  -May use over the counter cold medication and cough suppressants as needed for up to 5 days.  -Please come back to see [place] if no improvement in 2 weeks.  -Follow up with PCP as scheduled.     Lelon Frohlich, MD  Primary Care at [facility]

## 2018-05-26 NOTE — Patient Instructions (Signed)
-  It was nice meeting you today!  -Flu shot today per your request.  -May use over the counter cold medication and cough suppressants as needed for up to 5 days.  -Please come back to see [place] if no improvement in 2 weeks.  -Follow up with PCP as scheduled.

## 2018-06-17 ENCOUNTER — Encounter: Payer: Self-pay | Admitting: Family Medicine

## 2018-06-17 ENCOUNTER — Ambulatory Visit (INDEPENDENT_AMBULATORY_CARE_PROVIDER_SITE_OTHER): Payer: Self-pay | Admitting: Family Medicine

## 2018-06-17 ENCOUNTER — Other Ambulatory Visit: Payer: Self-pay

## 2018-06-17 VITALS — BP 118/72 | HR 121 | Temp 98.5°F | Ht 60.5 in | Wt 258.9 lb

## 2018-06-17 DIAGNOSIS — M25561 Pain in right knee: Secondary | ICD-10-CM

## 2018-06-17 DIAGNOSIS — J069 Acute upper respiratory infection, unspecified: Secondary | ICD-10-CM

## 2018-06-17 DIAGNOSIS — E039 Hypothyroidism, unspecified: Secondary | ICD-10-CM

## 2018-06-17 LAB — TSH: TSH: 6.65 u[IU]/mL — ABNORMAL HIGH (ref 0.35–4.50)

## 2018-06-17 MED ORDER — AZITHROMYCIN 250 MG PO TABS: ORAL_TABLET | ORAL | 0 refills | Status: AC

## 2018-06-17 NOTE — Patient Instructions (Signed)
Baker Cyst  A Baker cyst, also called a popliteal cyst, is a sac-like growth that forms at the back of the knee. The cyst forms when the fluid-filled sac (bursa) that cushions the knee joint becomes enlarged. The bursa that becomes a Baker cyst is located at the back of the knee joint.  What are the causes?  In most cases, a Baker cyst results from another knee problem that causes swelling inside the knee. This makes the fluid inside the knee joint (synovial fluid) flow into the bursa behind the knee, causing the bursa to enlarge.  What increases the risk?  You may be more likely to develop a Baker cyst if you already have a knee problem, such as:   A tear in cartilage that cushions the knee joint (meniscal tear).   A tear in the tissues that connect the bones of the knee joint (ligament tear).   Knee swelling from osteoarthritis, rheumatoid arthritis, or gout.  What are the signs or symptoms?  A Baker cyst does not always cause symptoms. A lump behind the knee may be the only sign of the condition. The lump may be painful, especially when the knee is straightened. If the lump is painful, the pain may come and go. The knee may also be stiff.  Symptoms may quickly get more severe if the cyst breaks open (ruptures). If your cyst ruptures, signs and symptoms may affect the knee and the back of the lower leg (calf) and may include:   Sudden or worsening pain.   Swelling.   Bruising.  How is this diagnosed?  This condition may be diagnosed based on your symptoms and medical history. Your health care provider will also do a physical exam. This may include:   Feeling the cyst to check whether it is tender.   Checking your knee for signs of another knee condition that causes swelling.  You may have imaging tests, such as:   X-rays.   MRI.   Ultrasound.  How is this treated?  A Baker cyst that is not painful may go away without treatment. If the cyst gets large or painful, it will likely get better if the  underlying knee problem is treated.  Treatment for a Baker cyst may include:   Resting.   Keeping weight off of the knee. This means not leaning on the knee to support your body weight.   NSAIDs to reduce pain and swelling.   A procedure to drain the fluid from the cyst with a needle (aspiration). You may also get an injection of a medicine that reduces swelling (steroid).   Surgery. This may be needed if other treatments do not work. This usually involves correcting knee damage and removing the cyst.  Follow these instructions at home:   Take over-the-counter and prescription medicines only as told by your health care provider.   Rest and return to your normal activities as told by your health care provider. Avoid activities that make pain or swelling worse. Ask your health care provider what activities are safe for you.   Keep all follow-up visits as told by your health care provider. This is important.  Contact a health care provider if:   You have knee pain, stiffness, or swelling that does not get better.  Get help right away if:   You have sudden or worsening pain and swelling in your calf area.  This information is not intended to replace advice given to you by your health care provider. Make   sure you discuss any questions you have with your health care provider.  Document Released: 04/27/2005 Document Revised: 01/16/2016 Document Reviewed: 01/16/2016  Elsevier Interactive Patient Education  2019 [employer]

## 2018-06-17 NOTE — Progress Notes (Signed)
Subjective:     Patient ID: [name], female   DOB: [birth year], 48 y.o.   MRN: [id number]  HPI Patient is seen today for several issues as follows  She had recent upper respiratory symptoms.  Onset about a week ago.  Was seen here few days ago and diagnosed with viral URI.  She still has some nasal congestion.  No fever.  No purulent secretions.  Nasal congestion is worse at night.  She denies any sore throat.  No body aches.  No purulent secretions.  Patient complains of some pain right posterior knee region.  Onset around October.  She states she was going on a beach trip and pushed against the seat in front of her in the car.  She has noticed some pain since then.  She did not feel any popping sensation and had no visible bruising.  No obvious swelling.  Her pain has been progressive since then.  She has had previous gastric bypass and avoids nonsteroidals.  Has taken some Tylenol with minimal relief.  Has not tried any heat or ice.  Hypothyroidism.  She is overdue for follow-up labs.  Needing refills.  Past Medical History:  Diagnosis Date  . Chest pain   . GERD (gastroesophageal reflux disease)   . Headache   . Thyroid disease    Past Surgical History:  Procedure Laterality Date  . BARIATRIC SURGERY    . CHOLECYSTECTOMY    . galbladder    . UMBILICAL HERNIA REPAIR      reports that she has never smoked. She has never used smokeless tobacco. She reports that she does not drink alcohol or use drugs. family history includes Diabetes in her father and sister; Heart attack in her mother; Heart disease in her father; Hypertension in her father. Allergies  Allergen Reactions  . Augmentin [Amoxicillin-Pot Clavulanate] Shortness Of Breath     Review of Systems  Constitutional: Negative for chills and fever.  HENT: Positive for congestion, sinus pressure and sinus pain.   Respiratory: Negative for cough.   Cardiovascular: Negative for chest pain.  Gastrointestinal: Negative  for abdominal pain.  Neurological: Negative for weakness and numbness.  Hematological: Negative for adenopathy.       Objective:   Physical Exam Constitutional:      Appearance: Normal appearance.  Neck:     Musculoskeletal: Neck supple.  Cardiovascular:     Rate and Rhythm: Normal rate and regular rhythm.  Pulmonary:     Effort: Pulmonary effort is normal.     Breath sounds: Normal breath sounds.  Musculoskeletal:     Right lower leg: No edema.     Left lower leg: No edema.     Comments: Full range of motion right knee.  She has some prominent swelling behind the right knee consistent with likely Baker's cyst.  No localized calf tenderness.  Neurological:     Mental Status: She is alert.        Assessment:     #1 recent URI type symptoms.  Suspect viral  #2 right posterior knee pain.  Question symptomatic Baker's cyst.  #3 hypothyroidism    Plan:     -Would not recommend any antibiotics at this time.  Treat symptomatically.  We did print prescription for Zithromax if her symptoms persist or worsen into next week  -Consider sports medicine referral if right posterior knee pain persists.  At this point she would like to observe  -Recheck TSH  -Schedule complete physical  Alinda Sierras  Calandria Mullings MD [place] [facility]

## 2018-06-21 ENCOUNTER — Other Ambulatory Visit: Payer: Self-pay

## 2018-06-21 MED ORDER — LEVOTHYROXINE SODIUM 125 MCG PO TABS: 125.0000 ug | ORAL_TABLET | Freq: Every day | ORAL | 1 refills | Status: DC

## 2018-07-17 ENCOUNTER — Other Ambulatory Visit: Payer: Self-pay | Admitting: Cardiovascular Disease

## 2018-08-08 ENCOUNTER — Telehealth: Payer: Self-pay

## 2018-08-08 NOTE — Telephone Encounter (Signed)
Called patient and left a detailed voice message to let patient know that Dr. Elease Hashimoto is not seeing patients in office for a physical at this time and we can schedule later or if she needs to be seen for any other reason we can schedule her for a WebEx visit.  OK for PEC to discuss and/or schedule patient.  CRM Created.

## 2018-08-09 NOTE — Telephone Encounter (Signed)
Called pt back but no answer.  Left pt a voicemail explaining we could schedule a physical later or if she needed an acute WebEx we could do that as well and to call office back.  Pt called and left voicemail on [place] 08/08/2018 stating she was trying to make an appointment and could be reached at [phone]

## 2018-08-10 ENCOUNTER — Encounter: Payer: Self-pay | Admitting: Family Medicine

## 2018-08-22 ENCOUNTER — Encounter: Payer: Self-pay | Admitting: Family Medicine

## 2018-08-23 ENCOUNTER — Encounter: Payer: Self-pay | Admitting: Family Medicine

## 2018-08-24 ENCOUNTER — Encounter: Payer: Self-pay | Admitting: Family Medicine

## 2018-08-25 ENCOUNTER — Telehealth: Payer: Self-pay

## 2018-08-25 ENCOUNTER — Other Ambulatory Visit: Payer: Self-pay

## 2018-08-25 NOTE — Telephone Encounter (Signed)
Called patient and LMOVM to return call  [place] for [facility] to Discuss results / PCP / recommendations / Schedule patient  When patient calls back please put her on the schedule for a Doxy appointment with Dr. Elease Hashimoto. I have called patient multiple times and not able to reach her and also have sent several MyChart messages. Please schedule her for available time she can do with Dr. Elease Hashimoto. Thank you!  CRM Created.

## 2018-08-25 NOTE — Telephone Encounter (Signed)
Called patient again and left a voice message for her to call back and schedule a Doxy appointment with Dr. Elease Hashimoto.  OK for PEC to discuss/advise/schedule Doxy appointment for patient.  CRM Created.  Copied from [place] [phone]. Topic: Appointment Scheduling - Scheduling Inquiry for Clinic >> Aug 24, 2018  4:07 PM Rainey Pines A wrote: Reason for CRM: Patient returning missed call to reschedule appointment. Patient would like a callback.

## 2018-08-25 NOTE — Telephone Encounter (Signed)
Copied from [place] [phone]. Topic: Quick Communication - See Telephone Encounter >> Aug 25, 2018  9:01 AM Loma [place] wrote: CRM for notification. See Telephone encounter for: 08/25/18. Pt was called this morning from [facility] for a virtual but today is not good. Would like to sch for maybe tomorrow at lunch? Tried holding for a bit and could not get thru. She was okay with you FU with her again at [phone]

## 2018-08-26 ENCOUNTER — Encounter: Payer: Self-pay | Admitting: Family Medicine

## 2018-08-26 NOTE — Telephone Encounter (Signed)
Called patient again and she has an appointment on 08/29/18 with Dr. Elease Hashimoto.

## 2018-08-29 ENCOUNTER — Ambulatory Visit (INDEPENDENT_AMBULATORY_CARE_PROVIDER_SITE_OTHER): Payer: Self-pay | Admitting: Family Medicine

## 2018-08-29 ENCOUNTER — Other Ambulatory Visit: Payer: Self-pay

## 2018-08-29 DIAGNOSIS — M79604 Pain in right leg: Secondary | ICD-10-CM

## 2018-08-29 NOTE — Progress Notes (Signed)
Patient ID: [name], female   DOB: Sep 27, 1970, 48 y.o.   MRN: [id number]  Virtual Visit via Video Note  I connected with [name] on 08/29/18 at  2:00 PM EDT by a video enabled telemedicine application and verified that I am speaking with the correct person using two identifiers.  Location patient: home Location provider:work or home office Persons participating in the virtual visit: patient, provider  I discussed the limitations of evaluation and management by telemedicine and the availability of in person appointments. The patient expressed understanding and agreed to proceed.   HPI: Patient relates since about October she has had some pain in her proximal right hamstring area.  She denies any specific injury.  No bruising or swelling.  She has had some pain since then but over the past couple weeks especially worsening of pain which she describes as sharp to achy quality radiating from the proximal hamstring area and possibly from the back down to the knee.  Pain seems to be worse early in the morning.  No definite weakness.  She initially applied some heat which seemed to help temporarily.  She is had previous gastric bypass surgery and avoids oral nonsteroidals.  Denies any numbness lower extremities.  No urine or stool incontinence.  Using topical Voltaren cream without much improvement.  No visible edema.  No calf pain.   ROS: See pertinent positives and negatives per HPI.  Past Medical History:  Diagnosis Date  . Chest pain   . GERD (gastroesophageal reflux disease)   . Headache   . Thyroid disease     Past Surgical History:  Procedure Laterality Date  . BARIATRIC SURGERY    . CHOLECYSTECTOMY    . galbladder    . UMBILICAL HERNIA REPAIR      Family History  Problem Relation Age of Onset  . Heart disease Father   . Hypertension Father   . Diabetes Father   . Heart attack Mother   . Diabetes Sister   . Stroke Neg Hx     SOCIAL HX: Smoker.  No  alcohol.   Current Outpatient Medications:  .  acetaminophen (TYLENOL) 500 MG tablet, Take 500 mg by mouth daily as needed for mild pain., Disp: , Rfl:  .  Calcium Citrate-Vitamin D (CALCIUM CITRATE + PO), Take 600 mg by mouth daily., Disp: , Rfl:  .  cetirizine-pseudoephedrine (ZYRTEC-D) 5-120 MG tablet, Take 1 tablet by mouth daily., Disp: 15 tablet, Rfl: 0 .  Cholecalciferol (VITAMIN D-3 PO), Take 2,000 Int'l Units by mouth daily. , Disp: , Rfl:  .  fluticasone (FLONASE) 50 MCG/ACT nasal spray, Place 2 sprays into both nostrils daily., Disp: 1 g, Rfl: 0 .  furosemide (LASIX) 20 MG tablet, TAKE 1 TABLET BY MOUTH ONCE DAILY AS NEEDED FOR FLUID OR EDEMA, Disp: 30 tablet, Rfl: 3 .  levothyroxine (SYNTHROID, LEVOTHROID) 125 MCG tablet, Take 1 tablet (125 mcg total) by mouth daily., Disp: 30 tablet, Rfl: 1 .  Multiple Vitamins-Iron (ONE DAILY MULTIVITAMIN/IRON PO), Take 1 tablet by mouth daily. , Disp: , Rfl:  .  nitroGLYCERIN (NITROSTAT) 0.4 MG SL tablet, Place 1 tablet (0.4 mg total) under the tongue every 5 (five) minutes as needed for chest pain., Disp: 25 tablet, Rfl: 3 .  pantoprazole (PROTONIX) 40 MG tablet, Take 1 tablet by mouth once daily, Disp: 90 tablet, Rfl: 0 .  phentermine (ADIPEX-P) 37.5 MG tablet, TAKE 1/2 TABLET BY MOUTH TWICE DAILY, Disp: , Rfl:   EXAM:  VITALS per  patient if applicable:  GENERAL: alert, oriented, appears well and in no acute distress  HEENT: atraumatic, conjunttiva clear, no obvious abnormalities on inspection of external nose and ears  NECK: normal movements of the head and neck  LUNGS: on inspection no signs of respiratory distress, breathing rate appears normal, no obvious gross SOB, gasping or wheezing  CV: no obvious cyanosis  MS: moves all visible extremities without noticeable abnormality  PSYCH/NEURO: pleasant and cooperative, no obvious depression or anxiety, speech and thought processing grossly intact  ASSESSMENT AND PLAN:  Discussed  the following assessment and plan:  Pain of right lower extremity - Plan: Ambulatory referral to Sports Medicine.  Etiology not clear from history.  She relates worsening pain radiating from the back and proximal hamstring area down to the knee.  No recent injury. -We set up sports medicine referral for further evaluation and patient agrees with this plan     I discussed the assessment and treatment plan with the patient. The patient was provided an opportunity to ask questions and all were answered. The patient agreed with the plan and demonstrated an understanding of the instructions.   The patient was advised to call back or seek an in-person evaluation if the symptoms worsen or if the condition fails to improve as anticipated.   Carolann Littler, MD

## 2018-09-13 ENCOUNTER — Other Ambulatory Visit: Payer: Self-pay

## 2018-09-13 ENCOUNTER — Ambulatory Visit: Payer: Self-pay

## 2018-09-13 ENCOUNTER — Ambulatory Visit (INDEPENDENT_AMBULATORY_CARE_PROVIDER_SITE_OTHER)
Admission: RE | Admit: 2018-09-13 | Discharge: 2018-09-13 | Disposition: A | Discharge status: Home / Self Care | Payer: Self-pay | Source: Ambulatory Visit | Attending: Family Medicine | Admitting: Family Medicine

## 2018-09-13 ENCOUNTER — Encounter: Payer: Self-pay | Admitting: Family Medicine

## 2018-09-13 ENCOUNTER — Ambulatory Visit (INDEPENDENT_AMBULATORY_CARE_PROVIDER_SITE_OTHER): Payer: Self-pay | Admitting: Family Medicine

## 2018-09-13 VITALS — BP 124/88 | HR 91 | Ht 60.5 in | Wt 254.0 lb

## 2018-09-13 DIAGNOSIS — G8929 Other chronic pain: Secondary | ICD-10-CM

## 2018-09-13 DIAGNOSIS — M25561 Pain in right knee: Principal | ICD-10-CM

## 2018-09-13 DIAGNOSIS — M1711 Unilateral primary osteoarthritis, right knee: Secondary | ICD-10-CM

## 2018-09-13 NOTE — Progress Notes (Signed)
[name] [place], [place] [address] Phone: [phone] Subjective:   [name], am serving as a scribe for Dr. Hulan Saas.  I'm seeing this patient by the request  of:  Eulas Post, MD   CC: Right leg pain.  OLM:[id number]  [name] is a 48 y.o. female coming in with complaint of right LE pain for years. Patient attributes her pain to driving longer distances. Patient states that pain starts in posterior aspect of knee and is now radiating to the anterior knee. Patient states that she has to use a cane in the morning but does not present with one at visit. Feels like her knee is unable to flex when she gets the pain. Has been using heat and bio freeze. Uses Tylenol for pain. Denies any radiating pain.  Patient does not member any specific injury.     Past Medical History:  Diagnosis Date  . Chest pain   . GERD (gastroesophageal reflux disease)   . Headache   . Thyroid disease    Past Surgical History:  Procedure Laterality Date  . BARIATRIC SURGERY    . CHOLECYSTECTOMY    . galbladder    . UMBILICAL HERNIA REPAIR     Social History   Socioeconomic History  . Marital status: Divorced    Spouse name: Not on file  . Number of children: Not on file  . Years of education: Not on file  . Highest education level: Not on file  Occupational History  . Not on file  Social Needs  . Financial resource strain: Not on file  . Food insecurity:    Worry: Not on file    Inability: Not on file  . Transportation needs:    Medical: Not on file    Non-medical: Not on file  Tobacco Use  . Smoking status: Never Smoker  . Smokeless tobacco: Never Used  Substance and Sexual Activity  . Alcohol use: [name]    Alcohol/week: 0.0 standard drinks  . Drug use: [name]  . Sexual activity: Never  Lifestyle  . Physical activity:    Days per week: 0 days    Minutes per session: 0 min  . Stress: Only a little  Relationships  .  Social connections:    Talks on phone: More than three times a week    Gets together: More than three times a week    Attends religious service: More than 4 times per year    Active member of club or organization: [name]    Attends meetings of clubs or organizations: Never    Relationship status: Divorced  Other Topics Concern  . Not on file  Social History Narrative  . Not on file   Allergies  Allergen Reactions  . Augmentin [Amoxicillin-Pot Clavulanate] Shortness Of Breath   Family History  Problem Relation Age of Onset  . Heart disease Father   . Hypertension Father   . Diabetes Father   . Heart attack Mother   . Diabetes Sister   . Stroke Neg Hx     Current Outpatient Medications (Endocrine & Metabolic):  .  levothyroxine (SYNTHROID, LEVOTHROID) 125 MCG tablet, Take 1 tablet (125 mcg total) by mouth daily.  Current Outpatient Medications (Cardiovascular):  .  furosemide (LASIX) 20 MG tablet, TAKE 1 TABLET BY MOUTH ONCE DAILY AS NEEDED FOR FLUID OR EDEMA .  nitroGLYCERIN (NITROSTAT) 0.4 MG SL tablet, Place 1 tablet (0.4 mg total) under the  tongue every 5 (five) minutes as needed for chest pain.  Current Outpatient Medications (Respiratory):  .  cetirizine-pseudoephedrine (ZYRTEC-D) 5-120 MG tablet, Take 1 tablet by mouth daily. .  fluticasone (FLONASE) 50 MCG/ACT nasal spray, Place 2 sprays into both nostrils daily.  Current Outpatient Medications (Analgesics):  .  acetaminophen (TYLENOL) 500 MG tablet, Take 500 mg by mouth daily as needed for mild pain.   Current Outpatient Medications (Other):  Marland Kitchen  Calcium Citrate-Vitamin D (CALCIUM CITRATE + PO), Take 600 mg by mouth daily. .  Cholecalciferol (VITAMIN D-3 PO), Take 2,000 Int'l Units by mouth daily.  .  Multiple Vitamins-Iron (ONE DAILY MULTIVITAMIN/IRON PO), Take 1 tablet by mouth daily.  .  pantoprazole (PROTONIX) 40 MG tablet, Take 1 tablet by mouth once daily .  phentermine (ADIPEX-P) 37.5 MG tablet, TAKE 1/2 TABLET  BY MOUTH TWICE DAILY    Past medical history, social, surgical and family history all reviewed in electronic medical record.  [name] pertanent information unless stated regarding to the chief complaint.   Review of Systems:  [name] headache, visual changes, nausea, vomiting, diarrhea, constipation, dizziness, abdominal pain, skin rash, fevers, chills, night sweats, weight loss, swollen lymph nodes, body aches, joint swelling,  chest pain, shortness of breath, mood changes.  Positive muscle aches  Objective  Blood pressure 124/88, pulse 91, height 5' 0.5" (1.537 m), weight 254 lb (115.2 kg), last menstrual period 09/07/2018, SpO2 99 %.    General: [name] apparent distress alert and oriented x3 mood and affect normal, dressed appropriately.  Morbidly obese HEENT: Pupils equal, extraocular movements intact  Respiratory: Patient's speak in full sentences and does not appear short of breath  Cardiovascular: Trace lower extremity edema, non tender, [name] erythema  Skin: Warm dry intact with [name] signs of infection or rash on extremities or on axial skeleton.  Abdomen: Soft nontender  Neuro: Cranial nerves II through XII are intact, neurovascularly intact in all extremities with 2+ DTRs and 2+ pulses.  Lymph: [name] lymphadenopathy of posterior or anterior cervical chain or axillae bilaterally.  Gait antalgic MSK:  tender with limited range of motion and good stability and symmetric strength and tone of shoulders, elbows, wrist, hip, and ankles bilaterally.  Right lower extremity exam shows the patient does have some arthritic changes of the knee.  Patient is diffuse on the knee.  [name] specific area of tenderness.  Does seem to have pretty decent stability.  Crepitus with some range of motion.  Negative pain with calf squeeze or Thompson test.  Patient is tender in the popliteal area.  Limited musculoskeletal ultrasound was performed and interpreted by Lyndal Pulley  Did ultrasound of patient's right lower extremity  difficult to assess secondary to patient's body habitus.  Patient does have some mild to moderate narrowing of the tricompartmental aspects of the knee.  [name] significant effusion noted though.  [name] loose bodies.  Degenerative changes of the medial and lateral meniscus.  Popliteal area once again difficult to assess secondary to patient's body habitus.  Unable to evaluate appropriately patient's vascular supply. 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 The above documentation has been reviewed and is accurate and complete Lyndal Pulley, DO       Note: This dictation was prepared with Dragon dictation along with smaller phrase technology. Any transcriptional errors that result from this process are unintentional.

## 2018-09-13 NOTE — Patient Instructions (Signed)
Good to see you.  Ice 20 minutes 2 times daily. Usually after activity and before bed. Continue the vitamin D Spenco orthotics "total support" online would be great  Turmeric 500mg  daily  Tart cherry extract pill form nightly 1200mg   See me again in 5-6 weeks

## 2018-09-14 ENCOUNTER — Encounter (HOSPITAL_COMMUNITY): Payer: Self-pay

## 2018-09-14 ENCOUNTER — Encounter: Payer: Self-pay | Admitting: Family Medicine

## 2018-09-14 ENCOUNTER — Ambulatory Visit (HOSPITAL_COMMUNITY)
Admission: RE | Admit: 2018-09-14 | Discharge: 2018-09-14 | Disposition: A | Discharge status: Home / Self Care | Payer: Self-pay | Source: Ambulatory Visit | Attending: Family Medicine | Admitting: Family Medicine

## 2018-09-14 DIAGNOSIS — M1711 Unilateral primary osteoarthritis, right knee: Secondary | ICD-10-CM | POA: Insufficient documentation

## 2018-09-14 DIAGNOSIS — M25561 Pain in right knee: Secondary | ICD-10-CM | POA: Insufficient documentation

## 2018-09-14 DIAGNOSIS — G8929 Other chronic pain: Secondary | ICD-10-CM | POA: Insufficient documentation

## 2018-09-14 NOTE — Assessment & Plan Note (Signed)
Likely secondary to patient's body habitus over the years.  X-rays ordered today to further evaluate the bony abnormalities of the knee that can be contributing.  Ultrasound was very difficult secondary to patient's body habitus.  We discussed with her how much weight loss could be beneficial.  Trial of topical anti-inflammatories given.  Patient declined any type of injection today.  Bracing was given to try to get more stability of the leg.  Due to patient's body habitus was unable to rule out a deep venous thrombosis so we will order a vascular exam to further evaluate but highly unlikely.  Patient does have a history of microcytic anemia and iron deficiency and encourage patient to do supplementations.  Discussed other vitamin supplements that could be beneficial.  Discussed proper shoes and over-the-counter orthotics are to be beneficial.  Follow-up again in 4 weeks

## 2018-09-14 NOTE — Progress Notes (Signed)
RT Venous doppler done 09/14/2018 was negative for DVT. Large amount of suprapatellar fluid noted right knee.  Results given to Dr. Genevie Ann nurse Mendel Ryder at 9:40am.

## 2018-09-15 ENCOUNTER — Encounter: Payer: Self-pay | Admitting: Family Medicine

## 2018-10-10 ENCOUNTER — Encounter: Payer: Self-pay | Admitting: Family Medicine

## 2018-10-14 ENCOUNTER — Encounter: Payer: Self-pay | Admitting: Family Medicine

## 2018-10-14 ENCOUNTER — Other Ambulatory Visit: Payer: Self-pay

## 2018-10-14 ENCOUNTER — Ambulatory Visit (INDEPENDENT_AMBULATORY_CARE_PROVIDER_SITE_OTHER): Payer: Self-pay | Admitting: Family Medicine

## 2018-10-14 VITALS — BP 118/78 | HR 92 | Temp 98.6°F | Ht 61.5 in | Wt 254.4 lb

## 2018-10-14 DIAGNOSIS — Z0000 Encounter for general adult medical examination without abnormal findings: Secondary | ICD-10-CM

## 2018-10-14 LAB — COMPREHENSIVE METABOLIC PANEL
ALT: 21 U/L (ref 0–35)
AST: 19 U/L (ref 0–37)
Albumin: 3.8 g/dL (ref 3.5–5.2)
Alkaline Phosphatase: 101 U/L (ref 39–117)
BUN: 7 mg/dL (ref 6–23)
CO2: 30 mEq/L (ref 19–32)
Calcium: 8.9 mg/dL (ref 8.4–10.5)
Chloride: 99 mEq/L (ref 96–112)
Creatinine, Ser: 0.58 mg/dL (ref 0.40–1.20)
GFR: 110.72 mL/min (ref >60.00)
Glucose, Bld: 125 mg/dL — ABNORMAL HIGH (ref 70–99)
Potassium: 3.8 mEq/L (ref 3.5–5.1)
Sodium: 138 mEq/L (ref 135–145)
Total Bilirubin: 0.4 mg/dL (ref 0.2–1.2)
Total Protein: 7.6 g/dL (ref 6.0–8.3)

## 2018-10-14 LAB — HEMOGLOBIN A1C: Hgb A1c MFr Bld: 6.7 % — ABNORMAL HIGH (ref 4.6–6.5)

## 2018-10-14 LAB — CBC WITH DIFFERENTIAL/PLATELET
Basophils Absolute: 0.1 10*3/uL (ref 0.0–0.1)
Basophils Relative: 0.9 % (ref 0.0–3.0)
Eosinophils Absolute: 0.2 10*3/uL (ref 0.0–0.7)
Eosinophils Relative: 1.6 % (ref 0.0–5.0)
HCT: 41 % (ref 36.0–46.0)
Hemoglobin: 12.6 g/dL (ref 12.0–15.0)
Lymphocytes Relative: 28.5 % (ref 12.0–46.0)
Lymphs Abs: 3.7 10*3/uL (ref 0.7–4.0)
MCHC: 30.8 g/dL (ref 30.0–36.0)
MCV: 61.4 fl — ABNORMAL LOW (ref 78.0–100.0)
Monocytes Absolute: 0.5 10*3/uL (ref 0.1–1.0)
Monocytes Relative: 3.5 % (ref 3.0–12.0)
Neutro Abs: 8.5 10*3/uL — ABNORMAL HIGH (ref 1.4–7.7)
Neutrophils Relative %: 65.5 % (ref 43.0–77.0)
Platelets: 238 10*3/uL (ref 150.0–400.0)
RBC: 6.67 Mil/uL — ABNORMAL HIGH (ref 3.87–5.11)
RDW: 17.3 % — ABNORMAL HIGH (ref 11.5–15.5)
WBC: 13 10*3/uL — ABNORMAL HIGH (ref 4.0–10.5)

## 2018-10-14 LAB — LIPID PANEL
Cholesterol: 170 mg/dL (ref 0–200)
HDL: 42.5 mg/dL (ref >39.00)
LDL Cholesterol: 103 mg/dL — ABNORMAL HIGH (ref 0–99)
NonHDL: 127.37
Total CHOL/HDL Ratio: 4
Triglycerides: 120 mg/dL (ref 0.0–149.0)
VLDL: 24 mg/dL (ref 0.0–40.0)

## 2018-10-14 LAB — FERRITIN: Ferritin: 83.8 ng/mL (ref 10.0–291.0)

## 2018-10-14 LAB — TSH: TSH: 5.42 u[IU]/mL — ABNORMAL HIGH (ref 0.35–4.50)

## 2018-10-14 LAB — VITAMIN B12: Vitamin B-12: >1500 pg/mL — ABNORMAL HIGH (ref 211–911)

## 2018-10-14 LAB — VITAMIN D 25 HYDROXY (VIT D DEFICIENCY, FRACTURES): VITD: 37.19 ng/mL (ref 30.00–100.00)

## 2018-10-14 LAB — FOLATE: Folate: >23.9 ng/mL (ref >5.9)

## 2018-10-14 MED ORDER — PANTOPRAZOLE SODIUM 40 MG PO TBEC: 40.0000 mg | DELAYED_RELEASE_TABLET | Freq: Every day | ORAL | 3 refills | Status: AC

## 2018-10-14 NOTE — Progress Notes (Signed)
Subjective:     Patient ID: [name], female   DOB: November 10, 1970, 48 y.o.   MRN: [id number]  HPI   Patient is here for well visit/physical exam.  Her chronic problems including morbid obesity, GERD, hypothyroidism, history of prediabetes, thalassemia trait, and past history of iron deficiency.  She has not been exercising consistently.  Health maintenance reviewed  -She gets yearly flu vaccine -Tetanus up-to-date (2016) -Last Pap smear was 05/07/2017 with recommended 3-year follow-up -Colonoscopy 08/20/2015 with recommended 5-year follow-up -Last mammogram was January 2019.  She plans to set up follow-up soon -She is had one-time previous Pneumovax  Brings in a sheet from her surgeon who did her gastric sleeve surgery 2012.  They are requesting several labs including hemoglobin A1c, TSH, comprehensive metabolic panel, PTH, vitamin D level, ferritin, B12, folate, B6, prealbumin  Wt Readings from Last 3 Encounters:  10/14/18 254 lb 6.4 oz (115.4 kg)  09/13/18 254 lb (115.2 kg)  06/17/18 258 lb 14.4 oz (117.4 kg)     Past Medical History:  Diagnosis Date  . Chest pain   . GERD (gastroesophageal reflux disease)   . Headache   . Thyroid disease    Past Surgical History:  Procedure Laterality Date  . BARIATRIC SURGERY    . CHOLECYSTECTOMY    . galbladder    . UMBILICAL HERNIA REPAIR      reports that she has never smoked. She has never used smokeless tobacco. She reports that she does not drink alcohol or use drugs. family history includes Diabetes in her father and sister; Heart attack in her mother; Heart disease in her father; Hypertension in her father. Allergies  Allergen Reactions  . Augmentin [Amoxicillin-Pot Clavulanate] Shortness Of Breath     Review of Systems  Constitutional: Negative for activity change, appetite change, fatigue, fever and unexpected weight change.  HENT: Negative for ear pain, hearing loss, sore throat and trouble swallowing.   Eyes:  Negative for visual disturbance.  Respiratory: Negative for cough and shortness of breath.   Cardiovascular: Negative for chest pain and palpitations.  Gastrointestinal: Negative for abdominal pain, blood in stool, constipation and diarrhea.  Genitourinary: Negative for dysuria and hematuria.  Musculoskeletal: Negative for arthralgias, back pain and myalgias.  Skin: Negative for rash.  Neurological: Negative for dizziness, syncope and headaches.  Hematological: Negative for adenopathy.  Psychiatric/Behavioral: Negative for confusion and dysphoric mood.       Objective:   Physical Exam Constitutional:      Appearance: She is well-developed.  HENT:     Head: Normocephalic and atraumatic.  Eyes:     Pupils: Pupils are equal, round, and reactive to light.  Neck:     Musculoskeletal: Normal range of motion and neck supple.     Thyroid: No thyromegaly.  Cardiovascular:     Rate and Rhythm: Normal rate and regular rhythm.     Heart sounds: Normal heart sounds. No murmur.  Pulmonary:     Effort: No respiratory distress.     Breath sounds: Normal breath sounds. No wheezing or rales.  Abdominal:     General: Bowel sounds are normal. There is no distension.     Palpations: Abdomen is soft. There is no mass.     Tenderness: There is no abdominal tenderness. There is no guarding or rebound.  Musculoskeletal: Normal range of motion.  Lymphadenopathy:     Cervical: No cervical adenopathy.  Skin:    Findings: No rash.  Neurological:     Mental Status:  She is alert and oriented to person, place, and time.     Cranial Nerves: No cranial nerve deficit.     Deep Tendon Reflexes: Reflexes normal.  Psychiatric:        Behavior: Behavior normal.        Thought Content: Thought content normal.        Judgment: Judgment normal.        Assessment:     Physical exam.  Patient has multiple medical problems as above including history of morbid obesity and previous gastric sleeve surgery 2012.   We discussed the following health maintenance issues    Plan:     -Continue with yearly flu vaccine -Ordered multiple labs that were requested as above.  She also needs follow-up TSH as we adjusted her thyroid medication several months ago -Patient will schedule repeat mammogram later this year  Eulas Post MD [facility]

## 2018-10-17 LAB — PTH, INTACT AND CALCIUM
Calcium: 8.7 mg/dL (ref 8.6–10.2)
PTH: 71 pg/mL — ABNORMAL HIGH (ref 14–64)

## 2018-10-17 LAB — EXTRA SPECIMEN

## 2018-10-17 LAB — VITAMIN B6: Vitamin B6: 20.1 ng/mL (ref 2.1–21.7)

## 2018-10-17 LAB — PREALBUMIN: Prealbumin: 16 mg/dL — ABNORMAL LOW (ref 17–34)

## 2018-10-19 ENCOUNTER — Telehealth: Payer: Self-pay | Admitting: Family Medicine

## 2018-10-19 NOTE — Telephone Encounter (Signed)
Pt given lab results per notes of Dr. Elease Hashimoto on 10/17/18. Pt verbalized understanding. She asks will she need to start taking Metformin for her diabetes. I advised I will send this question to Dr. Elease Hashimoto and someone will call back with his recommendation.   Pharmacy: [facility] [address] [place] [address] [phone] (Phone) [phone] (Fax)

## 2018-10-19 NOTE — Telephone Encounter (Signed)
Metformin is an option to start now, but what I would prefer is that she tighten up diet and pick up exercise and repeat A1C in 3 months.  If increasing at that time would consider Metformin.

## 2018-10-25 ENCOUNTER — Encounter: Payer: Self-pay | Admitting: Family Medicine

## 2018-10-25 ENCOUNTER — Ambulatory Visit: Payer: Self-pay

## 2018-10-25 ENCOUNTER — Other Ambulatory Visit: Payer: Self-pay

## 2018-10-25 ENCOUNTER — Ambulatory Visit (INDEPENDENT_AMBULATORY_CARE_PROVIDER_SITE_OTHER): Payer: Self-pay | Admitting: Family Medicine

## 2018-10-25 VITALS — BP 114/86 | Ht 61.5 in | Wt 252.0 lb

## 2018-10-25 DIAGNOSIS — M25561 Pain in right knee: Secondary | ICD-10-CM

## 2018-10-25 DIAGNOSIS — M1711 Unilateral primary osteoarthritis, right knee: Secondary | ICD-10-CM

## 2018-10-25 DIAGNOSIS — G8929 Other chronic pain: Secondary | ICD-10-CM

## 2018-10-25 NOTE — Assessment & Plan Note (Signed)
Given injection and attempted aspiration.  Was very difficult secondary to patient's body habitus and the synovitis noted within the axilla.  Patient hopefully will do well with the steroid injection but could be a candidate for Visco supplementation based on the x-rays.  We may have this done in the near future by myself or someone else due to patient's young age We will avoid any surgical intervention.

## 2018-10-25 NOTE — Patient Instructions (Addendum)
See me again in 4 weeks  Look for hinged brace online

## 2018-10-25 NOTE — Progress Notes (Signed)
[name] [place], [place] [address] Phone: [phone] Subjective:     [name], am serving as a scribe for Dr. Hulan Saas.   CC: Right knee pain follow-up  QQP:[id number]   09/13/2018: Likely secondary to patient's body habitus over the years.  X-rays ordered today to further evaluate the bony abnormalities of the knee that can be contributing.  Ultrasound was very difficult secondary to patient's body habitus.  We discussed with her how much weight loss could be beneficial.  Trial of topical anti-inflammatories given.  Patient declined any type of injection today.  Bracing was given to try to get more stability of the leg.  Due to patient's body habitus was unable to rule out a deep venous thrombosis so we will order a vascular exam to further evaluate but highly unlikely.  Patient does have a history of microcytic anemia and iron deficiency and encourage patient to do supplementations.  Discussed other vitamin supplements that could be beneficial.  Discussed proper shoes and over-the-counter orthotics are to be beneficial.  Follow-up again in 4 weeks  Update 10/25/2018: [name] is a 48 y.o. female coming in with complaint of  Right knee pain. States that her pain is not better than last visit. Pain is over anterior and posterior aspect of knee. In constant pain. Has been using Tylenol for pain and heating pad.  Feels the knee continues to be somewhat swollen.  Feels some decreased range of motion.  Making it difficult to walk long distances.   Patient did have x-rays taken Sep 13, 2018 showing mild to moderate tricompartmental degenerative changes.  Consistent with patient's previous ultrasound imaging. Ultrasound of the lower extremity negative for DVT this was done on Sep 14, 2018  Past Medical History:  Diagnosis Date  . Chest pain   . GERD (gastroesophageal reflux disease)   . Headache   . Thyroid disease    Past Surgical  History:  Procedure Laterality Date  . BARIATRIC SURGERY    . CHOLECYSTECTOMY    . galbladder    . UMBILICAL HERNIA REPAIR     Social History   Socioeconomic History  . Marital status: Divorced    Spouse name: Not on file  . Number of children: Not on file  . Years of education: Not on file  . Highest education level: Not on file  Occupational History  . Not on file  Social Needs  . Financial resource strain: Not on file  . Food insecurity    Worry: Not on file    Inability: Not on file  . Transportation needs    Medical: Not on file    Non-medical: Not on file  Tobacco Use  . Smoking status: Never Smoker  . Smokeless tobacco: Never Used  Substance and Sexual Activity  . Alcohol use: No    Alcohol/week: 0.0 standard drinks  . Drug use: No  . Sexual activity: Never  Lifestyle  . Physical activity    Days per week: 0 days    Minutes per session: 0 min  . Stress: Only a little  Relationships  . Social connections    Talks on phone: More than three times a week    Gets together: More than three times a week    Attends religious service: More than 4 times per year    Active member of club or organization: No    Attends meetings of clubs or organizations: Never  Relationship status: Divorced  Other Topics Concern  . Not on file  Social History Narrative  . Not on file   Allergies  Allergen Reactions  . Augmentin [Amoxicillin-Pot Clavulanate] Shortness Of Breath   Family History  Problem Relation Age of Onset  . Heart disease Father   . Hypertension Father   . Diabetes Father   . Heart attack Mother   . Diabetes Sister   . Stroke Neg Hx     Current Outpatient Medications (Endocrine & Metabolic):  .  levothyroxine (SYNTHROID, LEVOTHROID) 125 MCG tablet, Take 1 tablet (125 mcg total) by mouth daily.  Current Outpatient Medications (Cardiovascular):  .  furosemide (LASIX) 20 MG tablet, TAKE 1 TABLET BY MOUTH ONCE DAILY AS NEEDED FOR FLUID OR EDEMA .   nitroGLYCERIN (NITROSTAT) 0.4 MG SL tablet, Place 1 tablet (0.4 mg total) under the tongue every 5 (five) minutes as needed for chest pain.  Current Outpatient Medications (Respiratory):  .  cetirizine-pseudoephedrine (ZYRTEC-D) 5-120 MG tablet, Take 1 tablet by mouth daily. .  fluticasone (FLONASE) 50 MCG/ACT nasal spray, Place 2 sprays into both nostrils daily.  Current Outpatient Medications (Analgesics):  .  acetaminophen (TYLENOL) 500 MG tablet, Take 500 mg by mouth daily as needed for mild pain.   Current Outpatient Medications (Other):  Marland Kitchen  Calcium Citrate-Vitamin D (CALCIUM CITRATE + PO), Take 600 mg by mouth daily. .  Cholecalciferol (VITAMIN D-3 PO), Take 2,000 Int'l Units by mouth daily.  .  Multiple Vitamins-Iron (ONE DAILY MULTIVITAMIN/IRON PO), Take 1 tablet by mouth daily.  .  pantoprazole (PROTONIX) 40 MG tablet, Take 1 tablet (40 mg total) by mouth daily. .  phentermine (ADIPEX-P) 37.5 MG tablet, TAKE 1/2 TABLET BY MOUTH TWICE DAILY    Past medical history, social, surgical and family history all reviewed in electronic medical record.  No pertanent information unless stated regarding to the chief complaint.   Review of Systems:  No headache, visual changes, nausea, vomiting, diarrhea, constipation, dizziness, abdominal pain, skin rash, fevers, chills, night sweats, weight loss, swollen lymph nodes,, chest pain, shortness of breath, mood changes.  Positive muscle aches, body aches and joint swelling  Objective  Blood pressure 114/86, height 5' 1.5" (1.562 m), weight 252 lb (114.3 kg).    General: No apparent distress alert and oriented x3 mood and affect normal, dressed appropriately.  HEENT: Pupils equal, extraocular movements intact  Respiratory: Patient's speak in full sentences and does not appear short of breath  Cardiovascular: 1+ lower extremity edema, non tender, no erythema  Skin: Warm dry intact with no signs of infection or rash on extremities or on axial  skeleton.  Abdomen: Soft nontender morbidly obese Neuro: Cranial nerves II through XII are intact, neurovascularly intact in all extremities with 2+ DTRs and 2+ pulses.  Lymph: No lymphadenopathy of posterior or anterior cervical chain or axillae bilaterally.  Gait antalgic gait MSK:  Non tender with full range of motion and good stability and symmetric strength and tone of shoulders, elbows, wrist, hip, and ankles bilaterally.   Knee: Right valgus deformity noted. Large thigh to calf ratio.  Tender to palpation over medial and PF joint line.  ROM lacking last 5 degrees of extension as well as flexion. instability with valgus force.  painful patellar compression. Patellar glide with moderate crepitus. Patellar and quadriceps tendons unremarkable. Hamstring and quadriceps strength is normal. Contralateral knee shows mild arthritic changes   procedure: Real-time Ultrasound Guided Injection of right knee Device: GE Logiq Q7 Ultrasound guided injection  is preferred based studies that show increased duration, increased effect, greater accuracy, decreased procedural pain, increased response rate, and decreased cost with ultrasound guided versus blind injection.  Verbal informed consent obtained.  Time-out conducted.  Noted no overlying erythema, induration, or other signs of local infection.  Skin prepped in a sterile fashion.  Local anesthesia: Topical Ethyl chloride.  With sterile technique and under real time ultrasound guidance: With a 22-gauge 2 inch needle patient was injected with 4 cc of 0.5% Marcaine and 1 cc of Kenalog 40 mg/dL. This was from a superior lateral approach.  Completed without difficulty  Pain immediately resolved suggesting accurate placement of the medication.  Advised to call if fevers/chills, erythema, induration, drainage, or persistent bleeding.  Images permanently stored and available for review in the ultrasound unit.  Impression: Technically successful  ultrasound guided injection.   Impression and Recommendations:     This case required medical decision making of moderate complexity. The above documentation has been reviewed and is accurate and complete Lyndal Pulley, DO       Note: This dictation was prepared with Dragon dictation along with smaller phrase technology. Any transcriptional errors that result from this process are unintentional.

## 2018-10-26 ENCOUNTER — Other Ambulatory Visit: Payer: Self-pay | Admitting: *Deleted

## 2018-10-26 DIAGNOSIS — E038 Other specified hypothyroidism: Secondary | ICD-10-CM

## 2018-10-26 MED ORDER — LEVOTHYROXINE SODIUM 137 MCG PO TABS: 137.0000 ug | ORAL_TABLET | Freq: Every day | ORAL | 1 refills | Status: DC

## 2018-10-26 NOTE — Telephone Encounter (Signed)
Patient notified of Dr. Elease Hashimoto recommendation and verbalized understanding.

## 2018-10-27 ENCOUNTER — Encounter: Payer: Self-pay | Admitting: *Deleted

## 2018-10-28 ENCOUNTER — Inpatient Hospital Stay: Payer: Self-pay | Attending: Hematology and Oncology

## 2018-10-28 ENCOUNTER — Other Ambulatory Visit: Payer: Self-pay

## 2018-10-28 DIAGNOSIS — E669 Obesity, unspecified: Secondary | ICD-10-CM | POA: Insufficient documentation

## 2018-10-28 DIAGNOSIS — D500 Iron deficiency anemia secondary to blood loss (chronic): Secondary | ICD-10-CM | POA: Insufficient documentation

## 2018-10-28 DIAGNOSIS — D561 Beta thalassemia: Secondary | ICD-10-CM | POA: Insufficient documentation

## 2018-10-28 LAB — CBC WITH DIFFERENTIAL (CANCER CENTER ONLY)
Abs Immature Granulocytes: 0.08 10*3/uL — ABNORMAL HIGH (ref 0.00–0.07)
Basophils Absolute: 0.1 10*3/uL (ref 0.0–0.1)
Basophils Relative: 1 %
Eosinophils Absolute: 0.2 10*3/uL (ref 0.0–0.5)
Eosinophils Relative: 2 %
HCT: 41.4 % (ref 36.0–46.0)
Hemoglobin: 12.1 g/dL (ref 12.0–15.0)
Immature Granulocytes: 1 %
Lymphocytes Relative: 21 %
Lymphs Abs: 3.2 10*3/uL (ref 0.7–4.0)
MCH: 18.8 pg — ABNORMAL LOW (ref 26.0–34.0)
MCHC: 29.2 g/dL — ABNORMAL LOW (ref 30.0–36.0)
MCV: 64.2 fL — ABNORMAL LOW (ref 80.0–100.0)
Monocytes Absolute: 0.8 10*3/uL (ref 0.1–1.0)
Monocytes Relative: 5 %
Neutro Abs: 10.9 10*3/uL — ABNORMAL HIGH (ref 1.7–7.7)
Neutrophils Relative %: 70 %
Platelet Count: 284 10*3/uL (ref 150–400)
RBC: 6.45 MIL/uL — ABNORMAL HIGH (ref 3.87–5.11)
RDW: 18.3 % — ABNORMAL HIGH (ref 11.5–15.5)
WBC Count: 15.3 10*3/uL — ABNORMAL HIGH (ref 4.0–10.5)
nRBC: 0 % (ref 0.0–0.2)

## 2018-10-31 LAB — IRON AND TIBC
Iron: 42 ug/dL (ref 41–142)
Saturation Ratios: 13 % — ABNORMAL LOW (ref 21–57)
TIBC: 315 ug/dL (ref 236–444)
UIBC: 273 ug/dL (ref 120–384)

## 2018-10-31 LAB — FERRITIN: Ferritin: 96 ng/mL (ref 11–307)

## 2018-10-31 NOTE — Assessment & Plan Note (Signed)
Irondeficiency anemia due to chronic blood loss: Iron studies were reviewed and she does not have any evidence of iron deficiency. 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  Lab review:  Patient does feel fatigued and decreased energy.  However I do not know that giving IV iron is going to solve the problem. So we decided to see her back in 6 months with labs.  If her symptoms continue to persist then we may consider giving IV iron based on 8% iron saturation.

## 2018-11-01 ENCOUNTER — Telehealth: Payer: Self-pay | Admitting: Hematology and Oncology

## 2018-11-01 ENCOUNTER — Other Ambulatory Visit: Payer: Self-pay | Admitting: *Deleted

## 2018-11-01 NOTE — Telephone Encounter (Signed)
Per Merleen Nicely patient does not need to repeat labs. Patient is aware of my chart visit

## 2018-11-03 NOTE — Progress Notes (Signed)
HEMATOLOGY-ONCOLOGY MYCHART VIDEO VISIT PROGRESS NOTE  I connected with [name] on 11/04/2018 at 11:30 AM EDT by MyChart video conference and verified that I am speaking with the correct person using two identifiers.  I discussed the limitations, risks, security and privacy concerns of performing an evaluation and management service by MyChart and the availability of in person appointments.  I also discussed with the patient that there may be a patient responsible charge related to this service. The patient expressed understanding and agreed to proceed.  Patient's Location: Home Physician Location: Clinic  CHIEF COMPLIANT: Follow-up of iron deficiency anemia  INTERVAL HISTORY: KYRIANNA BARLETTA is a 48 y.o. female with above-mentioned history of iron deficiency anemia. Labs on 10/28/18 showed: WBC 15.3, ANC 10.9, Hg 12.1, HCT 41.4, MCV 64.2, platelets 284,000, iron saturation 13%, TIBC 315, ferritin 96. She presents over MyChart today to discuss the results of her recent labs.  Her major concern is related to obesity.  She has been discussed with the weight loss specialist.  REVIEW OF SYSTEMS:   Constitutional: Denies fevers, chills or abnormal weight loss Eyes: Denies blurriness of vision Ears, nose, mouth, throat, and face: Denies mucositis or sore throat Respiratory: Denies cough, dyspnea or wheezes Cardiovascular: Denies palpitation, chest discomfort Gastrointestinal:  Denies nausea, heartburn or change in bowel habits Skin: Denies abnormal skin rashes Lymphatics: Denies new lymphadenopathy or easy bruising Neurological:Denies numbness, tingling or new weaknesses Behavioral/Psych: Mood is stable, no new changes  Extremities: No lower extremity edema Breast: denies any pain or lumps or nodules in either breasts All other systems were reviewed with the patient and are negative.  Observations/Objective:  There were no vitals filed for this visit. There is no height or weight on  file to calculate BMI.  I have reviewed the data as listed CMP Latest Ref Rng & Units 10/14/2018 10/14/2018 09/27/2017  Glucose 70 - 99 mg/dL - 125(H) 142(H)  BUN 6 - 23 mg/dL - 7 5(L)  Creatinine 0.40 - 1.20 mg/dL - 0.58 0.54(L)  Sodium 135 - 145 mEq/L - 138 138  Potassium 3.5 - 5.1 mEq/L - 3.8 3.5  Chloride 96 - 112 mEq/L - 99 100  CO2 19 - 32 mEq/L - 30 23  Calcium 8.6 - 10.2 mg/dL 8.7 8.9 8.9  Total Protein 6.0 - 8.3 g/dL - 7.6 -  Total Bilirubin 0.2 - 1.2 mg/dL - 0.4 -  Alkaline Phos 39 - 117 U/L - 101 -  AST 0 - 37 U/L - 19 -  ALT 0 - 35 U/L - 21 -    Lab Results  Component Value Date   WBC 15.3 (H) 10/28/2018   HGB 12.1 10/28/2018   HCT 41.4 10/28/2018   MCV 64.2 (L) 10/28/2018   PLT 284 10/28/2018   NEUTROABS 10.9 (H) 10/28/2018      Assessment Plan:  Iron deficiency anemia Irondeficiency anemia due to chronic blood loss: Iron studies were reviewed and she does not have any evidence of iron deficiency. 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  Lab review: Hemoglobin 12.1, MCV 64.2: Microcytosis is due to thalassemia Ferritin 96, iron saturation 13%  Patient does feel fatigued and decreased energy.  However I do not know that giving IV iron is going to solve the problem. Return to clinic in 1 year with labs and follow-up after that with a MyChart video visit.   I discussed the assessment and treatment plan with  the patient. The patient was provided an opportunity to ask questions and all were answered. The patient agreed with the plan and demonstrated an understanding of the instructions. The patient was advised to call back or seek an in-person evaluation if the symptoms worsen or if the condition fails to improve as anticipated.   I provided 15 minutes of face-to-face MyChart video visit time during this encounter.    Rulon Eisenmenger, MD 11/04/2018   I, Molly Dorshimer, am acting as  scribe for Nicholas Lose, MD.  I have reviewed the above documentation for accuracy and completeness, and I agree with the above.

## 2018-11-04 ENCOUNTER — Other Ambulatory Visit: Payer: Self-pay

## 2018-11-04 ENCOUNTER — Inpatient Hospital Stay (HOSPITAL_BASED_OUTPATIENT_CLINIC_OR_DEPARTMENT_OTHER): Payer: Self-pay | Admitting: Hematology and Oncology

## 2018-11-04 DIAGNOSIS — E669 Obesity, unspecified: Secondary | ICD-10-CM

## 2018-11-04 DIAGNOSIS — D500 Iron deficiency anemia secondary to blood loss (chronic): Secondary | ICD-10-CM

## 2018-11-04 DIAGNOSIS — D561 Beta thalassemia: Secondary | ICD-10-CM

## 2018-11-07 ENCOUNTER — Ambulatory Visit (INDEPENDENT_AMBULATORY_CARE_PROVIDER_SITE_OTHER): Payer: Self-pay

## 2018-11-07 ENCOUNTER — Ambulatory Visit (INDEPENDENT_AMBULATORY_CARE_PROVIDER_SITE_OTHER): Payer: Self-pay | Admitting: Family Medicine

## 2018-11-07 DIAGNOSIS — M1711 Unilateral primary osteoarthritis, right knee: Secondary | ICD-10-CM

## 2018-11-07 DIAGNOSIS — M79604 Pain in right leg: Secondary | ICD-10-CM

## 2018-11-07 NOTE — Progress Notes (Signed)
[name] - 48 y.o. female MRN [id number]  Date of birth: 06-15-1970  SUBJECTIVE:  Including CC & ROS.  Chief Complaint  Patient presents with  . Injections    [name] is a 48 y.o. female that is presenting for right knee pain. The pain started posteriorly and has tried injection.   Review of Systems  HISTORY: Past Medical, Surgical, Social, and Family History Reviewed & Updated per EMR.   Pertinent Historical Findings include:  Past Medical History:  Diagnosis Date  . Chest pain   . GERD (gastroesophageal reflux disease)   . Headache   . Thyroid disease     Past Surgical History:  Procedure Laterality Date  . BARIATRIC SURGERY    . CHOLECYSTECTOMY    . galbladder    . UMBILICAL HERNIA REPAIR      Allergies  Allergen Reactions  . Augmentin [Amoxicillin-Pot Clavulanate] Shortness Of Breath    Family History  Problem Relation Age of Onset  . Heart disease Father   . Hypertension Father   . Diabetes Father   . Heart attack Mother   . Diabetes Sister   . Stroke Neg Hx      Social History   Socioeconomic History  . Marital status: Divorced    Spouse name: Not on file  . Number of children: Not on file  . Years of education: Not on file  . Highest education level: Not on file  Occupational History  . Not on file  Social Needs  . Financial resource strain: Not on file  . Food insecurity    Worry: Not on file    Inability: Not on file  . Transportation needs    Medical: Not on file    Non-medical: Not on file  Tobacco Use  . Smoking status: Never Smoker  . Smokeless tobacco: Never Used  Substance and Sexual Activity  . Alcohol use: No    Alcohol/week: 0.0 standard drinks  . Drug use: No  . Sexual activity: Never  Lifestyle  . Physical activity    Days per week: 0 days    Minutes per session: 0 min  . Stress: Only a little  Relationships  . Social connections    Talks on phone: More than three times a week    Gets together: More  than three times a week    Attends religious service: More than 4 times per year    Active member of club or organization: No    Attends meetings of clubs or organizations: Never    Relationship status: Divorced  . Intimate partner violence    Fear of current or ex partner: No    Emotionally abused: No    Physically abused: No    Forced sexual activity: No  Other Topics Concern  . Not on file  Social History Narrative  . Not on file     PHYSICAL EXAM:  VS: There were no vitals taken for this visit. Physical Exam Gen: NAD, alert, cooperative with exam, well-appearing   Aspiration/Injection Procedure Note [name] [birth year]  Procedure: Injection Indications: Right knee pain   Procedure Details Consent: Risks of procedure as well as the alternatives and risks of each were explained to the (patient/caregiver).  Consent for procedure obtained. Time Out: Verified patient identification, verified procedure, site/side was marked, verified correct patient position, special equipment/implants available, medications/allergies/relevent history reviewed, required imaging and test results available.  Performed.  The area was cleaned with iodine and alcohol swabs.  The right knee superior lateral suprapatellar pouch was injected using 4 cc's of 1% lidocaine with a 22 1 1/2" needle.  The syringe was switched and a 4 mL 22 mg/ mL of monovisc was injected. Ultrasound was used. Images were obtained in  Long views showing the injection.     A sterile dressing was applied.  Patient did tolerate procedure well.      ASSESSMENT & PLAN:   Degenerative arthritis of right knee Pain is ongoing. Has tried steroid injection - monovisc injection  - counseled on supportive care  - given indications to follow up.

## 2018-11-07 NOTE — Assessment & Plan Note (Signed)
Pain is ongoing. Has tried steroid injection - monovisc injection  - counseled on supportive care  - given indications to follow up.

## 2018-11-07 NOTE — Patient Instructions (Signed)
Nice to meet you  Take tylenol 650 mg three times a day is the best evidence based medicine we have for arthritis.  Glucosamine sulfate 750mg  twice a day is a supplement that has been shown to help moderate to severe arthritis. Vitamin D 2000 IU daily Fish oil 2 grams daily.  Tumeric 500mg  twice daily.  Capsaicin topically up to four times a day may also help with pain.     Please send me a message in MyChart with any questions or updates.  Please see me back in 4 weeks.   --Dr. Raeford Razor

## 2018-11-14 ENCOUNTER — Ambulatory Visit: Payer: Self-pay | Admitting: Family Medicine

## 2018-11-14 NOTE — Telephone Encounter (Signed)
Pt. Reports she had some elevated heart rate yesterday - up to 112. No chest pain or shortness of breath Pulse today is 90. Warm transfer to [facility] in the practice for a visit.  Answer Assessment - Initial Assessment Questions 1. DESCRIPTION: "Please describe your heart rate or heart beat that you are having" (e.g., fast/slow, regular/irregular, skipped or extra beats, "palpitations")     Heart beats fast 2. ONSET: "When did it start?" (Minutes, hours or days)      Yesterday 3. DURATION: "How long does it last" (e.g., seconds, minutes, hours)     Seconds 4. PATTERN "Does it come and go, or has it been constant since it started?"  "Does it get worse with exertion?"   "Are you feeling it now?"     Comes and goes 5. TAP: "Using your hand, can you tap out what you are feeling on a chair or table in front of you, so that I can hear?" (Note: not all patients can do this)       No 6. HEART RATE: "Can you tell me your heart rate?" "How many beats in 15 seconds?"  (Note: not all patients can do this)       112 - yesterday 7. RECURRENT SYMPTOM: "Have you ever had this before?" If so, ask: "When was the last time?" and "What happened that time?"      No 8. CAUSE: "What do you think is causing the palpitations?"     Unsure 9. CARDIAC HISTORY: "Do you have any history of heart disease?" (e.g., heart attack, angina, bypass surgery, angioplasty, arrhythmia)      No 10. OTHER SYMPTOMS: "Do you have any other symptoms?" (e.g., dizziness, chest pain, sweating, difficulty breathing)       No 11. PREGNANCY: "Is there any chance you are pregnant?" "When was your last menstrual period?"       No  Protocols used: HEART RATE AND HEARTBEAT QUESTIONS-A-AH

## 2018-11-14 NOTE — Telephone Encounter (Signed)
Pt scheduled with PCP

## 2018-11-15 ENCOUNTER — Other Ambulatory Visit: Payer: Self-pay

## 2018-11-15 ENCOUNTER — Ambulatory Visit (INDEPENDENT_AMBULATORY_CARE_PROVIDER_SITE_OTHER): Payer: Self-pay | Admitting: Podiatry

## 2018-11-15 ENCOUNTER — Ambulatory Visit (INDEPENDENT_AMBULATORY_CARE_PROVIDER_SITE_OTHER): Payer: Self-pay | Admitting: Family Medicine

## 2018-11-15 ENCOUNTER — Encounter

## 2018-11-15 ENCOUNTER — Encounter: Payer: Self-pay | Admitting: Family Medicine

## 2018-11-15 ENCOUNTER — Encounter: Payer: Self-pay | Admitting: Podiatry

## 2018-11-15 ENCOUNTER — Ambulatory Visit (INDEPENDENT_AMBULATORY_CARE_PROVIDER_SITE_OTHER): Payer: Self-pay

## 2018-11-15 ENCOUNTER — Other Ambulatory Visit: Payer: Self-pay | Admitting: Podiatry

## 2018-11-15 VITALS — Temp 97.6°F

## 2018-11-15 DIAGNOSIS — M898X7 Other specified disorders of bone, ankle and foot: Secondary | ICD-10-CM

## 2018-11-15 DIAGNOSIS — R000 Tachycardia, unspecified: Secondary | ICD-10-CM

## 2018-11-15 DIAGNOSIS — M79671 Pain in right foot: Secondary | ICD-10-CM

## 2018-11-15 DIAGNOSIS — Q828 Other specified congenital malformations of skin: Secondary | ICD-10-CM | POA: Insufficient documentation

## 2018-11-15 NOTE — Progress Notes (Signed)
Patient ID: [name], female   DOB: [birth year], 48 y.o.   MRN: [id number]  This visit type was conducted due to national recommendations for restrictions regarding the COVID-19 pandemic in an effort to limit this patient's exposure and mitigate transmission in our community.   Virtual Visit via Video Note  I connected with [name] on 11/15/18 at 11:45 AM EDT by a video enabled telemedicine application and verified that I am speaking with the correct person using two identifiers.  Location patient: home Location provider:work or home office Persons participating in the virtual visit: patient, provider  I discussed the limitations of evaluation and management by telemedicine and the availability of in person appointments. The patient expressed understanding and agreed to proceed.   HPI:  Patient has chronic problems including history of obesity, hypothyroidism, GERD, prediabetes, thalassemia trait.  She called today stating she had 4 days of mild elevated heart rate.  Apparently her high pulse was 112 and currently 98 and generally ranging between 90 and low 100s.  She is afebrile.  She denies any dehydration issues.  She had recent hemoglobin on 6/19 of 12.1.  She does consume some tea but no recent change in caffeine intake.  Nuys any recent dyspnea or chest pain.  Recent TSH 5.4 and we did increase her levothyroxine 137 mcg.  She does take low-dose phentermine per gastric bypass surgeon but is been on this apparently for several years.  O2 sat earlier today 97%   ROS: See pertinent positives and negatives per HPI.  Past Medical History:  Diagnosis Date  . Chest pain   . GERD (gastroesophageal reflux disease)   . Headache   . Thyroid disease     Past Surgical History:  Procedure Laterality Date  . BARIATRIC SURGERY    . CHOLECYSTECTOMY    . galbladder    . UMBILICAL HERNIA REPAIR      Family History  Problem Relation Age of Onset  . Heart disease Father   .  Hypertension Father   . Diabetes Father   . Heart attack Mother   . Diabetes Sister   . Stroke Neg Hx     SOCIAL HX: Non-smoker   Current Outpatient Medications:  .  acetaminophen (TYLENOL) 500 MG tablet, Take 500 mg by mouth daily as needed for mild pain., Disp: , Rfl:  .  Calcium Citrate-Vitamin D (CALCIUM CITRATE + PO), Take 600 mg by mouth daily., Disp: , Rfl:  .  cetirizine-pseudoephedrine (ZYRTEC-D) 5-120 MG tablet, Take 1 tablet by mouth daily., Disp: 15 tablet, Rfl: 0 .  Cholecalciferol (VITAMIN D-3 PO), Take 2,000 Int'l Units by mouth daily. , Disp: , Rfl:  .  fluticasone (FLONASE) 50 MCG/ACT nasal spray, Place 2 sprays into both nostrils daily., Disp: 1 g, Rfl: 0 .  furosemide (LASIX) 20 MG tablet, TAKE 1 TABLET BY MOUTH ONCE DAILY AS NEEDED FOR FLUID OR EDEMA, Disp: 30 tablet, Rfl: 3 .  levothyroxine (SYNTHROID) 137 MCG tablet, Take 1 tablet (137 mcg total) by mouth daily before breakfast., Disp: 90 tablet, Rfl: 1 .  Multiple Vitamins-Iron (ONE DAILY MULTIVITAMIN/IRON PO), Take 1 tablet by mouth daily. , Disp: , Rfl:  .  nitroGLYCERIN (NITROSTAT) 0.4 MG SL tablet, Place 1 tablet (0.4 mg total) under the tongue every 5 (five) minutes as needed for chest pain., Disp: 25 tablet, Rfl: 3 .  pantoprazole (PROTONIX) 40 MG tablet, Take 1 tablet (40 mg total) by mouth daily., Disp: 90 tablet, Rfl: 3 .  phentermine (  ADIPEX-P) 37.5 MG tablet, TAKE 1/2 TABLET BY MOUTH TWICE DAILY, Disp: , Rfl:   EXAM:  VITALS per patient if applicable:  GENERAL: alert, oriented, appears well and in no acute distress  HEENT: atraumatic, conjunttiva clear, no obvious abnormalities on inspection of external nose and ears  NECK: normal movements of the head and neck  LUNGS: on inspection no signs of respiratory distress, breathing rate appears normal, no obvious gross SOB, gasping or wheezing  CV: no obvious cyanosis  MS: moves all visible extremities without noticeable abnormality  PSYCH/NEURO:  pleasant and cooperative, no obvious depression or anxiety, speech and thought processing grossly intact  ASSESSMENT AND PLAN:  Discussed the following assessment and plan:  Tachycardia-mild by history and patient relatively asymptomatic.  She did have mild recent increased dose of levothyroxine but this is not likely accounting for this.  She is on phentermine but has been on this for years.  -We recommend observation for now.  If her rate is increasing over 115 to be in touch or if she has any new symptoms such as dyspnea or chest pains or dizziness. -She will consult with her surgeon regarding the phentermine -Reminder that we need to recheck her TSH in 1 to 2 months after recent dose increase of levothyroxine     I discussed the assessment and treatment plan with the patient. The patient was provided an opportunity to ask questions and all were answered. The patient agreed with the plan and demonstrated an understanding of the instructions.   The patient was advised to call back or seek an in-person evaluation if the symptoms worsen or if the condition fails to improve as anticipated.   Carolann Littler, MD

## 2018-11-15 NOTE — Progress Notes (Signed)
This patient presents the office with chief complaint of a painful callus on the bottom of the third toe right foot.  Patient states that she has had this callus present for years and she trims it down herself.  She says she was seen in this office about a year ago by Dr. Amalia Hailey who provided treatment.  She returns to the office today for continued treatment but desires to proceed with surgical correction for the elimination of this painful corn.    Vascular  Dorsalis pedis and posterior tibial pulses are palpable  B/L.  Capillary return  WNL.  Temperature gradient is  WNL.  Skin turgor  WNL  Sensorium  Senn Weinstein monofilament wire  WNL. Normal tactile sensation.  Nail Exam  Patient has normal nails with no evidence of bacterial or fungal infection.  Orthopedic  Exam  Muscle tone and muscle strength  WNL.  No limitations of motion feet  B/L.  No crepitus or joint effusion noted.  Foot type is unremarkable and digits show no abnormalities.  Bony prominences are unremarkable.  Skin  No open lesions.  Normal skin texture and turgor. Corn noted sub 3rd toe right foot.    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  Discussed this condition with this patient.  X-rays revealed no bony pathology in the third toe right foot.  Recommended she return to the office when the callus/porokeratosis returns and be evaluated by a surgical team member for correction of this painful skin lesion.  Gardiner Barefoot DPM

## 2018-11-20 IMAGING — CT CT HEART MORP W/ CTA COR W/ SCORE W/ CA W/CM &/OR W/O CM
4 of 7 series · 8 of 20 positions shown, 9 images · IV contrast (APPLIED)
Comparison: None.

CLINICAL DATA: Chest pain

EXAM:
Cardiac CTA
MEDICATIONS:
Sub lingual nitro. 4mg and lopressor 10mg
TECHNIQUE: The patient was scanned on a Siemens [REDACTED]ice scanner. Gantry
rotation speed was 270 msecs. Collimation was .9mm. A 100 kV
prospective scan was triggered in the descending thoracic aorta at
111 HU's with 5% padding centered around 78% of the R-R interval.
Average HR during the scan was 53 bpm. The 3D data set was
interpreted on a dedicated work station using MPR, MIP and VRT
modes. A total of 80cc of contrast was used.

[Series 6: best diast 72 % · axial · 0.33mm/px · z∈[+803,+844]mm · 2 of 306 slices shown, 3 images]
[im 102/306  vessel]
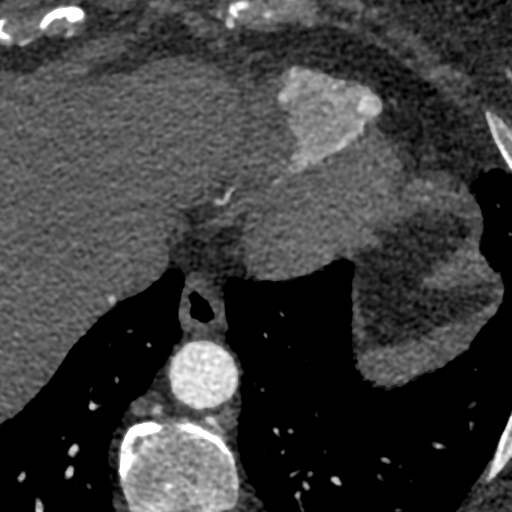
[im 102/306  lung]
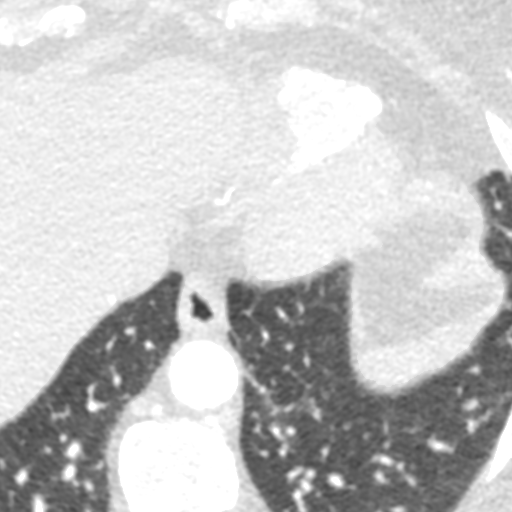
[im 204/306  vessel]
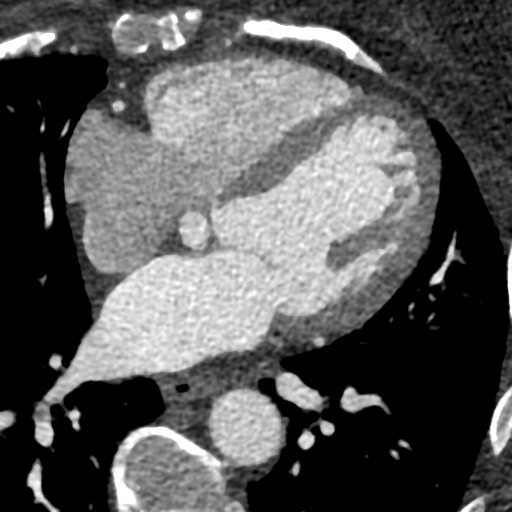

[Series 7: best syst 38 % · axial · 0.33mm/px · z∈[+803,+844]mm · 2 of 306 slices shown]
[im 102/306  vessel]
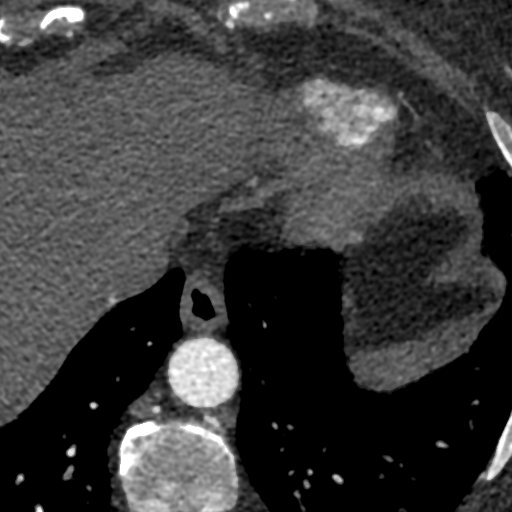
[im 204/306  vessel]
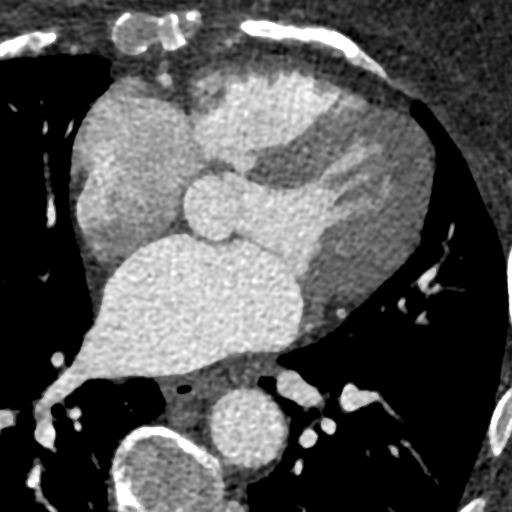

[Series 8: ts diast sharp 72 % · axial · 0.33mm/px · z∈[+803,+844]mm · 2 of 306 slices shown]
[im 102/306  lung]
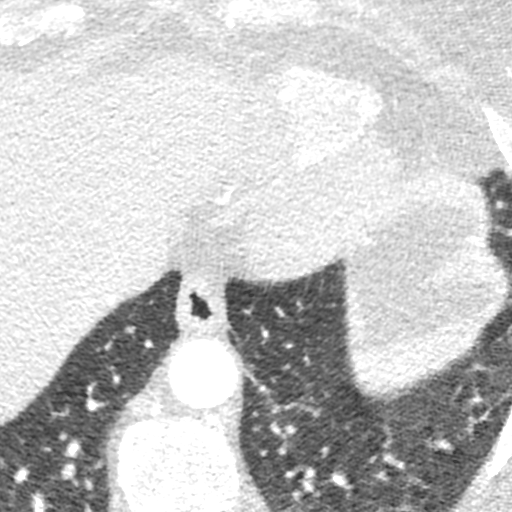
[im 204/306  lung]
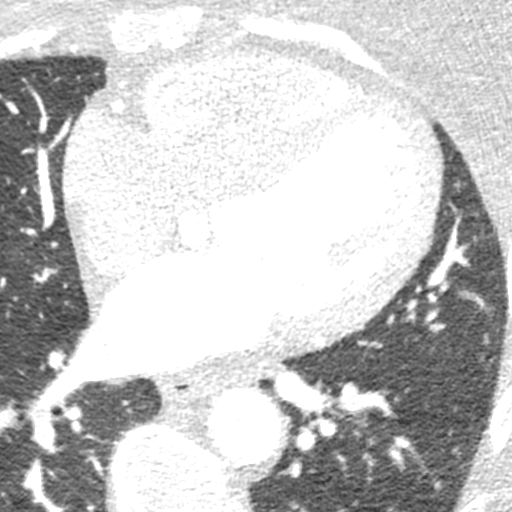

[Series 9: ts syst sharp 38 % · axial · 0.33mm/px · z∈[+803,+844]mm · 2 of 306 slices shown]
[im 102/306  lung]
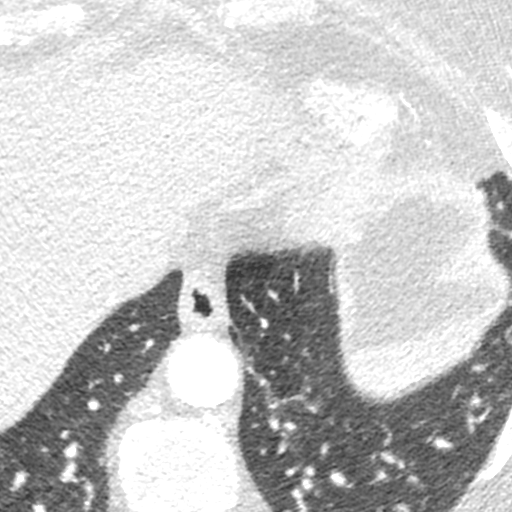
[im 204/306  lung]
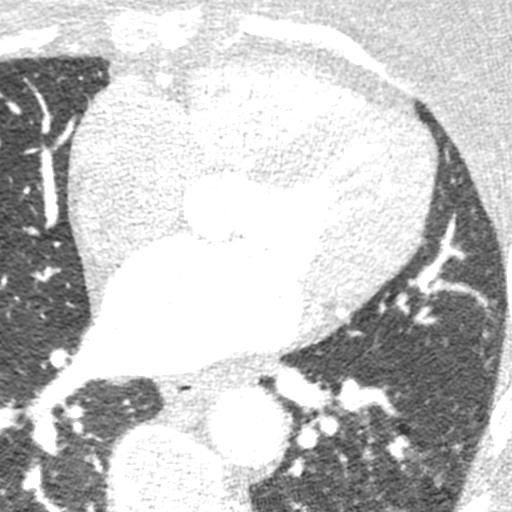

[8 of 20 positions shown; findings below may reference images not displayed]

FINDINGS: Non-cardiac: See separate report from [REDACTED]. No
significant findings on limited lung and soft tissue windows.

Calcium Score: No calcium detected

Coronary Arteries: Right dominant with no anomalies

LM: Normal

LAD: Normal distal LAD not well visualize

D1: Normal

D2: Normal

Circumflex: Normal

OM1: Normal

OM2: Normal

RCA: Normal

PDA: Normal

PLA: Normal
IMPRESSION: 1. Calcium score 0

2. Normal right dominant coronary arteries distal LAD poorly seen
due to poor vasodilatation and patient size

3.  Normal aortic root 3.5 cm

Ingunn Harpa Ronlor

EXAM:
OVER-READ INTERPRETATION  CT CHEST

The following report is an over-read performed by radiologist Dr.
Shalley Jim [REDACTED] on 10/01/2017. This
over-read does not include interpretation of cardiac or coronary
anatomy or pathology. The coronary calcium score/coronary CTA
interpretation by the cardiologist is attached.
FINDINGS: Within the visualized portions of the thorax there are no suspicious
appearing pulmonary nodules or masses, there is no acute
consolidative airspace disease, no pleural effusions, no
pneumothorax and no lymphadenopathy. Visualized portions of the
upper abdomen demonstrate postoperative changes of gastric surgery..
There are no aggressive appearing lytic or blastic lesions noted in
the visualized portions of the skeleton.
IMPRESSION: 1. No significant incidental noncardiac findings are noted.

## 2019-01-17 ENCOUNTER — Ambulatory Visit: Payer: Self-pay | Admitting: Family Medicine

## 2019-01-24 ENCOUNTER — Ambulatory Visit: Payer: Self-pay | Admitting: Family Medicine

## 2019-02-12 ENCOUNTER — Other Ambulatory Visit: Payer: Self-pay | Admitting: Hematology and Oncology

## 2019-04-14 ENCOUNTER — Other Ambulatory Visit: Payer: Self-pay

## 2019-04-14 MED ORDER — FUROSEMIDE 20 MG PO TABS: ORAL_TABLET | ORAL | 0 refills | Status: DC

## 2019-05-23 ENCOUNTER — Ambulatory Visit: Payer: Self-pay | Admitting: Family Medicine

## 2019-05-31 ENCOUNTER — Encounter: Payer: Self-pay | Admitting: Family Medicine

## 2019-05-31 ENCOUNTER — Ambulatory Visit: Payer: Self-pay | Admitting: Cardiovascular Disease

## 2019-05-31 NOTE — Progress Notes (Deleted)
Cardiology Office Note:    Date:  05/31/2019   ID:  [name], DOB Nov 24, 1970, MRN [id number]  PCP:  Eulas Post, MD  Cardiologist:  Namine Beahm   Referring MD: Eulas Post, MD   Problem list 1.  Morbid obesity-peak weight of 499 pounds status post gastric bypass and gastric sleeve in 2012. 2.  Atypical chest pain 3.  Hypothyroidism 4.  Beta thalassemia trait  No chief complaint on file.    History of Present Illness:    [name] is a 49 y.o. female with a hx of morbid obesity and atypical chest pain.  She presents with chest pressure - occurs off and on.  Associated with dyspnea. Previous patient of [facility].  Has tried SL NTG  The pain is a pressure like sensation., radiates to right arm Occurs when she wakes up in the am. Pain will last a few minutes.   Pressure  Also has pain when she lifts heavy objects ( like a case of water or heavy bags.)  Has a previous dx of OSA but lost weight and does not need it any more  Has occasional CP when she brings groceries in from the car.  Has started exercising now.  Is slowly losing weight now  Has had stress test in the past.    November 26, 2017:  [name] is  seen today for follow-up of her shortness of breath. She had a coronary CT angiogram which showed normal coronary arteries.  Her coronary calcium score is 0. Echocardiogram shows normal left ventricular systolic and diastolic function.  Had gastric bypass surgery in 2011.   Has been gaining weight recently  Is not eatign well.  Craving junk food.   Has lots of fatigue   Jan. 20, 2021:  [name] is seen today for follow up visit   She has had a normal coronary CT angiogram with no evidence of CAD.   Coronary calcium score is 0.  Echo in April, 2019 showed normal systlolic and diastolic function.   Past Medical History:  Diagnosis Date  . Chest pain   . GERD (gastroesophageal reflux disease)   . Headache   . Thyroid disease     Past Surgical  History:  Procedure Laterality Date  . BARIATRIC SURGERY    . CHOLECYSTECTOMY    . galbladder    . UMBILICAL HERNIA REPAIR      Current Medications: No outpatient medications have been marked as taking for the 05/31/19 encounter (Appointment) with Shweta Aman, Wonda Cheng, MD.     Allergies:   Augmentin [amoxicillin-pot clavulanate]   Social History   Socioeconomic History  . Marital status: Divorced    Spouse name: Not on file  . Number of children: Not on file  . Years of education: Not on file  . Highest education level: Not on file  Occupational History  . Not on file  Tobacco Use  . Smoking status: Never Smoker  . Smokeless tobacco: Never Used  Substance and Sexual Activity  . Alcohol use: No    Alcohol/week: 0.0 standard drinks  . Drug use: No  . Sexual activity: Never  Other Topics Concern  . Not on file  Social History Narrative  . Not on file   Social Determinants of Health   Financial Resource Strain:   . Difficulty of Paying Living Expenses: Not on file  Food Insecurity:   . Worried About Charity fundraiser in the Last Year: Not on file  .  Ran Out of Food in the Last Year: Not on file  Transportation Needs:   . Lack of Transportation (Medical): Not on file  . Lack of Transportation (Non-Medical): Not on file  Physical Activity:   . Days of Exercise per Week: Not on file  . Minutes of Exercise per Session: Not on file  Stress:   . Feeling of Stress : Not on file  Social Connections:   . Frequency of Communication with Friends and Family: Not on file  . Frequency of Social Gatherings with Friends and Family: Not on file  . Attends Religious Services: Not on file  . Active Member of Clubs or Organizations: Not on file  . Attends Archivist Meetings: Not on file  . Marital Status: Not on file     Family History: The patient's family history includes Diabetes in her father and sister; Heart attack in her mother; Heart disease in her father;  Hypertension in her father. There is no history of Stroke.  ROS:   Please see the history of present illness.     All other systems reviewed and are negative.  EKGs/Labs/Other Studies Reviewed:    The following studies were reviewed today: Previous notes from Dr. Claiborne Billings   EKG:  ***  Recent Labs: 10/14/2018: ALT 21; BUN 7; Creatinine, Ser 0.58; Potassium 3.8; Sodium 138; TSH 5.42 10/28/2018: Hemoglobin 12.1; Platelet Count 284  Recent Lipid Panel    Component Value Date/Time   CHOL 170 10/14/2018 1006   TRIG 120.0 10/14/2018 1006   HDL 42.50 10/14/2018 1006   CHOLHDL 4 10/14/2018 1006   VLDL 24.0 10/14/2018 1006   LDLCALC 103 (H) 10/14/2018 1006    Physical Exam: There were no vitals taken for this visit.  GEN:  Well nourished, well developed in no acute distress HEENT: Normal NECK: No JVD; No carotid bruits LYMPHATICS: No lymphadenopathy CARDIAC: RRR ***, no murmurs, rubs, gallops RESPIRATORY:  Clear to auscultation without rales, wheezing or rhonchi  ABDOMEN: Soft, non-tender, non-distended MUSCULOSKELETAL:  No edema; No deformity  SKIN: Warm and dry NEUROLOGIC:  Alert and oriented x 3     ASSESSMENT:    No diagnosis found. PLAN:    In order of problems listed above:  1.  Chest pain: Patient has a long history of chest discomfort.   . Coronary CT angiogram shows normal coronaries.   2.  Shortness of breath with exertion.    Echo shows normal LV systolic and diastolic function   3.  Morbid obesity :  She wants to lose weight.   I agree that the most likely cause of her shortness of breath is her morbid obesity. I encouraged her to get back with her general medical doctor. At this Point there is nothing further I can add from a cardiac standpoint.  I will see her on an as-needed basis.  Medication Adjustments/Labs and Tests Ordered: Current medicines are reviewed at length with the patient today.  Concerns regarding medicines are outlined above.  No  orders of the defined types were placed in this encounter.  No orders of the defined types were placed in this encounter.      Signed, Mertie Moores, MD  05/31/2019 8:28 AM    [place]

## 2019-06-05 ENCOUNTER — Encounter: Payer: Self-pay | Admitting: Family Medicine

## 2019-06-05 ENCOUNTER — Ambulatory Visit: Payer: Self-pay | Admitting: Family Medicine

## 2019-06-07 ENCOUNTER — Telehealth: Payer: Self-pay | Admitting: Family Medicine

## 2019-06-07 ENCOUNTER — Encounter: Payer: Self-pay | Admitting: Family Medicine

## 2019-06-07 NOTE — Telephone Encounter (Signed)
      [name] is needing to cancel her appointment because someone has to go to [employer] today and wants to come in the office Friday because she needs labs. I told her that she has to do a virtual visit because of her symptoms   I have canceled her appointment

## 2019-06-09 ENCOUNTER — Telehealth (INDEPENDENT_AMBULATORY_CARE_PROVIDER_SITE_OTHER): Payer: Self-pay | Admitting: Family Medicine

## 2019-06-09 ENCOUNTER — Other Ambulatory Visit: Payer: Self-pay

## 2019-06-09 DIAGNOSIS — R110 Nausea: Secondary | ICD-10-CM

## 2019-06-09 DIAGNOSIS — R739 Hyperglycemia, unspecified: Secondary | ICD-10-CM

## 2019-06-09 DIAGNOSIS — E038 Other specified hypothyroidism: Secondary | ICD-10-CM

## 2019-06-09 DIAGNOSIS — R198 Other specified symptoms and signs involving the digestive system and abdomen: Secondary | ICD-10-CM

## 2019-06-09 MED ORDER — MUPIROCIN 2 % EX OINT: 1.0000 "application " | TOPICAL_OINTMENT | Freq: Two times a day (BID) | CUTANEOUS | 0 refills | Status: AC

## 2019-06-09 MED ORDER — ONDANSETRON 8 MG PO TBDP: ORAL_TABLET | ORAL | 1 refills | Status: DC

## 2019-06-09 MED ORDER — LEVOTHYROXINE SODIUM 137 MCG PO TABS: 137.0000 ug | ORAL_TABLET | Freq: Every day | ORAL | 1 refills | Status: DC

## 2019-06-09 NOTE — Progress Notes (Signed)
This visit type was conducted due to national recommendations for restrictions regarding the COVID-19 pandemic in an effort to limit this patient's exposure and mitigate transmission in our community.   Virtual Visit via Video Note  I connected with [name] on 06/09/19 at  1:45 PM EST by a video enabled telemedicine application and verified that I am speaking with the correct person using two identifiers.  Location patient: home Location provider:work or home office Persons participating in the virtual visit: patient, provider  I discussed the limitations of evaluation and management by telemedicine and the availability of in person appointments. The patient expressed understanding and agreed to proceed.   HPI: [name] has multiple medical problems including thalassemia trait, history of morbid obesity, obstructive sleep apnea, history of gastric bypass surgery, obesity hypoventilation syndrome, chronic microcytic anemia, hypothyroidism, history of GERD. She called to discuss the following:  She had recent 1 day history of some nausea without vomiting and mild diarrhea. No sick contacts. No bloody diarrhea. No fevers or chills. No cough. No dyspnea. Symptoms now are fully resolved after taking some Imodium. She takes care of her 26 year old dad and she had Covid test on the 18th which was negative. She has not had any recent loss of taste or smell. She had previous cholecystectomy 2005. History of gastric bypass surgery  Second issue is history of hypothyroidism. She had elevated TSH back in June and we increased her levothyroxine to 137 mcg. She needs follow-up soon  History of hyperglycemia. Her A1c back in June it increased to 6.7%. Needs follow-up  Other issue is she has had some umbilical irritation ever since her gastric bypass surgery. She has been using Neosporin occasionally and intermittently has little bit of bleeding. She thinks this is irritation around the scar. She has  noticed some mild redness.   ROS: See pertinent positives and negatives per HPI.  Past Medical History:  Diagnosis Date  . Chest pain   . GERD (gastroesophageal reflux disease)   . Headache   . Thyroid disease     Past Surgical History:  Procedure Laterality Date  . BARIATRIC SURGERY    . CHOLECYSTECTOMY    . galbladder    . UMBILICAL HERNIA REPAIR      Family History  Problem Relation Age of Onset  . Heart disease Father   . Hypertension Father   . Diabetes Father   . Heart attack Mother   . Diabetes Sister   . Stroke Neg Hx     SOCIAL HX: Non-smoker. No alcohol.   Current Outpatient Medications:  .  acetaminophen (TYLENOL) 500 MG tablet, Take 500 mg by mouth daily as needed for mild pain., Disp: , Rfl:  .  Calcium Citrate-Vitamin D (CALCIUM CITRATE + PO), Take 600 mg by mouth daily., Disp: , Rfl:  .  cetirizine-pseudoephedrine (ZYRTEC-D) 5-120 MG tablet, Take 1 tablet by mouth daily., Disp: 15 tablet, Rfl: 0 .  Cholecalciferol (VITAMIN D-3 PO), Take 2,000 Int'l Units by mouth daily. , Disp: , Rfl:  .  fluticasone (FLONASE) 50 MCG/ACT nasal spray, Place 2 sprays into both nostrils daily., Disp: 1 g, Rfl: 0 .  furosemide (LASIX) 20 MG tablet, Take 1 tablet by mouth once a day, Disp: 30 tablet, Rfl: 0 .  levothyroxine (SYNTHROID) 137 MCG tablet, Take 1 tablet (137 mcg total) by mouth daily before breakfast., Disp: 90 tablet, Rfl: 1 .  mirabegron ER (MYRBETRIQ) 50 MG TB24 tablet, Take by mouth., Disp: , Rfl:  .  Multiple  Vitamins-Iron (ONE DAILY MULTIVITAMIN/IRON PO), Take 1 tablet by mouth daily. , Disp: , Rfl:  .  nitroGLYCERIN (NITROSTAT) 0.4 MG SL tablet, Place 1 tablet (0.4 mg total) under the tongue every 5 (five) minutes as needed for chest pain., Disp: 25 tablet, Rfl: 3 .  ondansetron (ZOFRAN-ODT) 8 MG disintegrating tablet, DISSOLVE 1 TABLET IN MOUTH EVERY 8 HOURS AS NEEDED FOR NAUSEA FOR UP TO 7 DAYS, Disp: 20 tablet, Rfl: 1 .  pantoprazole (PROTONIX) 40 MG  tablet, Take 1 tablet (40 mg total) by mouth daily., Disp: 90 tablet, Rfl: 3 .  phentermine (ADIPEX-P) 37.5 MG tablet, TAKE 1/2 TABLET BY MOUTH TWICE DAILY, Disp: , Rfl:   EXAM:  VITALS per patient if applicable:  GENERAL: alert, oriented, appears well and in no acute distress  HEENT: atraumatic, conjunttiva clear, no obvious abnormalities on inspection of external nose and ears  NECK: normal movements of the head and neck  LUNGS: on inspection no signs of respiratory distress, breathing rate appears normal, no obvious gross SOB, gasping or wheezing  CV: no obvious cyanosis  MS: moves all visible extremities without noticeable abnormality  PSYCH/NEURO: pleasant and cooperative, no obvious depression or anxiety, speech and thought processing grossly intact  ASSESSMENT AND PLAN:  Discussed the following assessment and plan:  #1 recent transient nausea without vomiting and diarrhea. She has had similar episodes in the past. She did not have any associated fever, localizing abdominal pain, or any respiratory symptoms  -She was requesting refill of Zofran this was sent in -Symptoms are improved today but needs follow-up for any persistent symptoms  #2 hypothyroidism. Slightly under replaced by labs last June.  -Future lab for TSH ordered and she needs to schedule this for the next month -We did agree to refill her levothyroxine for 6 months but does need labs as above.  #3 history of hyperglycemia. Most recent A1c had increased to 6.7%  -Future lab order for hemoglobin A1c to schedule in 1 month  #4 reported umbilical irritation intermittently since her gastric surgery. -Leave off Neosporin. Try Bactroban with prescription sent. If irritation not improving over the next couple weeks needs office follow-up to further assess     I discussed the assessment and treatment plan with the patient. The patient was provided an opportunity to ask questions and all were answered. The  patient agreed with the plan and demonstrated an understanding of the instructions.   The patient was advised to call back or seek an in-person evaluation if the symptoms worsen or if the condition fails to improve as anticipated.     Carolann Littler, MD

## 2019-07-13 ENCOUNTER — Ambulatory Visit: Payer: Self-pay | Admitting: Cardiovascular Disease

## 2019-08-18 ENCOUNTER — Ambulatory Visit: Payer: Self-pay

## 2019-08-19 ENCOUNTER — Ambulatory Visit: Payer: Self-pay

## 2019-10-25 ENCOUNTER — Ambulatory Visit: Payer: Self-pay | Admitting: Family Medicine

## 2019-11-02 ENCOUNTER — Other Ambulatory Visit: Payer: Self-pay | Admitting: Family Medicine

## 2019-11-02 DIAGNOSIS — E038 Other specified hypothyroidism: Secondary | ICD-10-CM

## 2019-11-02 DIAGNOSIS — R739 Hyperglycemia, unspecified: Secondary | ICD-10-CM

## 2019-11-14 ENCOUNTER — Encounter: Payer: Self-pay | Admitting: Family Medicine

## 2019-11-17 ENCOUNTER — Encounter: Payer: Self-pay | Admitting: Family Medicine

## 2019-11-20 ENCOUNTER — Encounter: Payer: Self-pay | Admitting: Family Medicine

## 2019-12-08 ENCOUNTER — Encounter: Payer: Self-pay | Admitting: Family Medicine

## 2020-01-02 ENCOUNTER — Encounter: Payer: Self-pay | Admitting: Family Medicine

## 2020-01-30 ENCOUNTER — Ambulatory Visit: Payer: Self-pay | Admitting: Cardiovascular Disease

## 2020-03-15 ENCOUNTER — Encounter: Payer: Self-pay | Admitting: Family Medicine

## 2020-03-29 ENCOUNTER — Encounter: Payer: Self-pay | Admitting: Family Medicine

## 2020-07-05 ENCOUNTER — Encounter: Payer: Self-pay | Admitting: Family Medicine

## 2020-08-14 HISTORY — PX: FOOT SURGERY: SHX648

## 2020-09-09 ENCOUNTER — Encounter: Payer: Self-pay | Admitting: Gastroenterology

## 2020-09-10 ENCOUNTER — Other Ambulatory Visit: Payer: Self-pay

## 2020-09-10 ENCOUNTER — Encounter: Payer: Self-pay | Admitting: Family Medicine

## 2020-09-11 ENCOUNTER — Ambulatory Visit: Payer: Self-pay | Admitting: Family Medicine

## 2020-10-03 ENCOUNTER — Encounter: Payer: Self-pay | Admitting: Gastroenterology

## 2020-10-20 ENCOUNTER — Other Ambulatory Visit: Payer: Self-pay | Admitting: Family Medicine

## 2020-10-26 ENCOUNTER — Other Ambulatory Visit: Payer: Self-pay | Admitting: Family Medicine

## 2020-11-01 ENCOUNTER — Other Ambulatory Visit: Payer: Self-pay

## 2020-11-01 ENCOUNTER — Encounter: Payer: Self-pay | Admitting: Family Medicine

## 2020-11-01 ENCOUNTER — Ambulatory Visit (INDEPENDENT_AMBULATORY_CARE_PROVIDER_SITE_OTHER): Payer: Self-pay | Admitting: Family Medicine

## 2020-11-01 VITALS — BP 130/70 | HR 71 | Temp 99.3°F | Wt 241.1 lb

## 2020-11-01 DIAGNOSIS — E038 Other specified hypothyroidism: Secondary | ICD-10-CM

## 2020-11-01 DIAGNOSIS — Z0000 Encounter for general adult medical examination without abnormal findings: Secondary | ICD-10-CM

## 2020-11-01 LAB — LIPID PANEL
Cholesterol: 163 mg/dL (ref 0–200)
HDL: 40.2 mg/dL (ref >39.00)
LDL Cholesterol: 98 mg/dL (ref 0–99)
NonHDL: 123.23
Total CHOL/HDL Ratio: 4
Triglycerides: 127 mg/dL (ref 0.0–149.0)
VLDL: 25.4 mg/dL (ref 0.0–40.0)

## 2020-11-01 LAB — CBC WITH DIFFERENTIAL/PLATELET
Basophils Absolute: 0.1 K/uL (ref 0.0–0.1)
Basophils Relative: 0.8 % (ref 0.0–3.0)
Eosinophils Absolute: 0.2 K/uL (ref 0.0–0.7)
Eosinophils Relative: 1.2 % (ref 0.0–5.0)
HCT: 40.3 % (ref 36.0–46.0)
Hemoglobin: 12.3 g/dL (ref 12.0–15.0)
Lymphocytes Relative: 37.3 % (ref 12.0–46.0)
Lymphs Abs: 4.9 K/uL — ABNORMAL HIGH (ref 0.7–4.0)
MCHC: 30.5 g/dL (ref 30.0–36.0)
MCV: 60.3 fl — ABNORMAL LOW (ref 78.0–100.0)
Monocytes Absolute: 0.7 K/uL (ref 0.1–1.0)
Monocytes Relative: 5.6 % (ref 3.0–12.0)
Neutro Abs: 7.3 K/uL (ref 1.4–7.7)
Neutrophils Relative %: 55.1 % (ref 43.0–77.0)
Platelets: 287 K/uL (ref 150.0–400.0)
RBC: 6.69 Mil/uL — ABNORMAL HIGH (ref 3.87–5.11)
RDW: 17.7 % — ABNORMAL HIGH (ref 11.5–15.5)
WBC: 13.2 K/uL — ABNORMAL HIGH (ref 4.0–10.5)

## 2020-11-01 LAB — HEPATIC FUNCTION PANEL
ALT: 15 U/L (ref 0–35)
AST: 16 U/L (ref 0–37)
Albumin: 4.2 g/dL (ref 3.5–5.2)
Alkaline Phosphatase: 94 U/L (ref 39–117)
Bilirubin, Direct: 0.1 mg/dL (ref 0.0–0.3)
Total Bilirubin: 0.3 mg/dL (ref 0.2–1.2)
Total Protein: 8.2 g/dL (ref 6.0–8.3)

## 2020-11-01 LAB — BASIC METABOLIC PANEL WITH GFR
BUN: 11 mg/dL (ref 6–23)
CO2: 28 meq/L (ref 19–32)
Calcium: 9.1 mg/dL (ref 8.4–10.5)
Chloride: 99 meq/L (ref 96–112)
Creatinine, Ser: 0.53 mg/dL (ref 0.40–1.20)
GFR: 107.73 mL/min (ref >60.00)
Glucose, Bld: 79 mg/dL (ref 70–99)
Potassium: 3.7 meq/L (ref 3.5–5.1)
Sodium: 136 meq/L (ref 135–145)

## 2020-11-01 LAB — TSH: TSH: 5.27 u[IU]/mL — ABNORMAL HIGH (ref 0.35–4.50)

## 2020-11-01 MED ORDER — LEVOTHYROXINE SODIUM 137 MCG PO TABS: 137.0000 ug | ORAL_TABLET | Freq: Every day | ORAL | 3 refills | Status: DC

## 2020-11-01 NOTE — Patient Instructions (Signed)
Set up repeat mammogram  Let's plan to repeat pap smear when your foot is healed  We will call you with the lab results.

## 2020-11-01 NOTE — Progress Notes (Signed)
Established Patient Office Visit  Subjective:  Patient ID: [name], female    DOB: 1970/05/31  Age: 50 y.o. MRN: [id number]  CC:  Chief Complaint  Patient presents with   Annual Exam    Pt would like a handicap hanger    HPI [name] presents for physical exam.  She had severe left foot injury and what sounds like tib-fib fracture back in April following a fall down several steps.  She states she is had multiple surgeries at [facility] since then.  She has her foot immobilizer and states she may be in this as long as a year.  Otherwise doing fairly well.  Her chronic problems include history of morbid obesity, gastric bypass surgery 2012, hypothyroidism, history of prediabetes, thalassemia trait with chronic mild microcytic anemia.  She had A1c of 6.0% yesterday.  Needs refills of her thyroid medication.  Health maintenance reviewed:  -Tetanus due 2026 -Previous hepatitis C screen negative -Pap smear is due at this time but patient unable because of her injury above -She is scheduled for repeat colonoscopy which is due at this time -Last mammogram 2019 -No history of shingles vaccine.  She just turned 49 this year.    Family history-patient's dad is 23.  He has history of type 2 diabetes, hypertension, and heart disease.  Mom died of complications of some sort of heart problem.  She has a sister with type 2 diabetes.  Social history-she is single.  Non-smoker.  No alcohol.  Currently not working.  Lives with 6 year old father.   Past Medical History:  Diagnosis Date   Chest pain    GERD (gastroesophageal reflux disease)    Headache    Thyroid disease     Past Surgical History:  Procedure Laterality Date   BARIATRIC SURGERY     CHOLECYSTECTOMY     galbladder     UMBILICAL HERNIA REPAIR      Family History  Problem Relation Age of Onset   Heart attack Mother    Heart disease Father    Hypertension Father    Diabetes Father    Diabetes Sister    Stroke  Neg Hx     Social History   Socioeconomic History   Marital status: Divorced    Spouse name: Not on file   Number of children: Not on file   Years of education: Not on file   Highest education level: Not on file  Occupational History   Not on file  Tobacco Use   Smoking status: Never   Smokeless tobacco: Never  Vaping Use   Vaping Use: Never used  Substance and Sexual Activity   Alcohol use: No    Alcohol/week: 0.0 standard drinks   Drug use: No   Sexual activity: Never  Other Topics Concern   Not on file  Social History Narrative   Not on file   Social Determinants of Health   Financial Resource Strain: Not on file  Food Insecurity: Not on file  Transportation Needs: Not on file  Physical Activity: Not on file  Stress: Not on file  Social Connections: Not on file  Intimate Partner Violence: Not on file    Outpatient Medications Prior to Visit  Medication Sig Dispense Refill   acetaminophen (TYLENOL) 500 MG tablet Take 500 mg by mouth daily as needed for mild pain.     Calcium Citrate-Vitamin D (CALCIUM CITRATE + PO) Take 600 mg by mouth daily.     cetirizine-pseudoephedrine (ZYRTEC-D) 5-120  MG tablet Take 1 tablet by mouth daily. 15 tablet 0   Cholecalciferol (VITAMIN D-3 PO) Take 2,000 Int'l Units by mouth daily.      furosemide (LASIX) 20 MG tablet Take 1 tablet by mouth once a day 30 tablet 0   mirabegron ER (MYRBETRIQ) 50 MG TB24 tablet Take by mouth.     Multiple Vitamins-Iron (ONE DAILY MULTIVITAMIN/IRON PO) Take 1 tablet by mouth daily.      mupirocin ointment (BACTROBAN) 2 % Place 1 application into the nose 2 (two) times daily. 22 g 0   nitroGLYCERIN (NITROSTAT) 0.4 MG SL tablet Place 1 tablet (0.4 mg total) under the tongue every 5 (five) minutes as needed for chest pain. 25 tablet 3   ondansetron (ZOFRAN-ODT) 8 MG disintegrating tablet DISSOLVE 1 TABLET IN MOUTH EVERY 8 HOURS FOR NAUSEA FOR  UP  TO  7  DAYS 20 tablet 0   pantoprazole (PROTONIX) 40 MG  tablet Take 1 tablet (40 mg total) by mouth daily. 90 tablet 3   phentermine (ADIPEX-P) 37.5 MG tablet TAKE 1/2 TABLET BY MOUTH TWICE DAILY     levothyroxine (SYNTHROID) 137 MCG tablet Take 1 tablet (137 mcg total) by mouth daily before breakfast. 90 tablet 1   fluticasone (FLONASE) 50 MCG/ACT nasal spray Place 2 sprays into both nostrils daily. 1 g 0   No facility-administered medications prior to visit.    Allergies  Allergen Reactions   Augmentin [Amoxicillin-Pot Clavulanate] Shortness Of Breath    ROS Review of Systems  Constitutional:  Negative for fatigue and unexpected weight change.  Eyes:  Negative for visual disturbance.  Respiratory:  Negative for cough, chest tightness, shortness of breath and wheezing.   Cardiovascular:  Negative for chest pain, palpitations and leg swelling.  Gastrointestinal:  Negative for abdominal pain.  Endocrine: Negative for polydipsia and polyuria.  Genitourinary:  Negative for dysuria.  Neurological:  Negative for dizziness, seizures, syncope, weakness, light-headedness and headaches.     Objective:    Physical Exam Vitals reviewed.  HENT:     Right Ear: Tympanic membrane and ear canal normal.     Left Ear: Tympanic membrane and ear canal normal.     Mouth/Throat:     Pharynx: Oropharynx is clear. No oropharyngeal exudate.  Cardiovascular:     Rate and Rhythm: Normal rate and regular rhythm.  Pulmonary:     Effort: Pulmonary effort is normal.     Breath sounds: Normal breath sounds.  Abdominal:     Palpations: Abdomen is soft.     Tenderness: There is no abdominal tenderness.     Comments: Are from previous gastric bypass surgery and umbilical hernia repair.  Musculoskeletal:     Cervical back: Neck supple.     Comments: Left foot is in a cam walker  Lymphadenopathy:     Cervical: No cervical adenopathy.  Neurological:     General: No focal deficit present.  Psychiatric:        Mood and Affect: Mood normal.        Thought  Content: Thought content normal.    BP 130/70 (BP Location: Left Arm, Patient Position: Sitting, Cuff Size: Normal)   Pulse 71   Temp 99.3 F (37.4 C) (Oral)   Wt 241 lb 1.6 oz (109.4 kg)   SpO2 96%   BMI 44.82 kg/m  Wt Readings from Last 3 Encounters:  11/01/20 241 lb 1.6 oz (109.4 kg)  10/25/18 252 lb (114.3 kg)  10/14/18 254 lb 6.4 oz (115.4  kg)     Health Maintenance Due  Topic Date Due   Pneumococcal Vaccine 62-28 Years old (1 - PCV) Never done   FOOT EXAM  Never done   OPHTHALMOLOGY EXAM  Never done   URINE MICROALBUMIN  Never done   Zoster Vaccines- Shingrix (1 of 2) Never done   HEMOGLOBIN A1C  04/15/2019   MAMMOGRAM  04/12/2020   COLONOSCOPY (Pts 45-66yrs Insurance coverage will need to be confirmed)  08/19/2020   COVID-19 Vaccine (2 - Pfizer series) 10/25/2020    There are no preventive care reminders to display for this patient.  Lab Results  Component Value Date   TSH 5.42 (H) 10/14/2018   Lab Results  Component Value Date   WBC 15.3 (H) 10/28/2018   HGB 12.1 10/28/2018   HCT 41.4 10/28/2018   MCV 64.2 (L) 10/28/2018   PLT 284 10/28/2018   Lab Results  Component Value Date   NA 138 10/14/2018   K 3.8 10/14/2018   CO2 30 10/14/2018   GLUCOSE 125 (H) 10/14/2018   BUN 7 10/14/2018   CREATININE 0.58 10/14/2018   BILITOT 0.4 10/14/2018   ALKPHOS 101 10/14/2018   AST 19 10/14/2018   ALT 21 10/14/2018   PROT 7.6 10/14/2018   ALBUMIN 3.8 10/14/2018   CALCIUM 8.9 10/14/2018   CALCIUM 8.7 10/14/2018   GFR 110.72 10/14/2018   Lab Results  Component Value Date   CHOL 170 10/14/2018   Lab Results  Component Value Date   HDL 42.50 10/14/2018   Lab Results  Component Value Date   LDLCALC 103 (H) 10/14/2018   Lab Results  Component Value Date   TRIG 120.0 10/14/2018   Lab Results  Component Value Date   CHOLHDL 4 10/14/2018   Lab Results  Component Value Date   HGBA1C 6.7 (H) 10/14/2018      Assessment & Plan:   Physical exam.  We  discussed the following health maintenance items  -Needs follow-up colonoscopy and she is scheduled -Continue annual flu vaccine -We discussed shingles vaccine.  She states she has never had chickenpox.  We will check varicella IgG antibody -Had recent A1c yesterday of 6.0% -Obtain screening labs including TSH -Set up repeat mammogram -We will plan to do Pap smear after her foot is more fully healed.  We did not feel like we could get her up into the stirrups today given her recent injury -Fill levothyroxine for 1 year   Meds ordered this encounter  Medications   levothyroxine (SYNTHROID) 137 MCG tablet    Sig: Take 1 tablet (137 mcg total) by mouth daily before breakfast.    Dispense:  90 tablet    Refill:  3    Follow-up: No follow-ups on file.    Carolann Littler, MD

## 2020-11-04 ENCOUNTER — Other Ambulatory Visit: Payer: Self-pay

## 2020-11-04 DIAGNOSIS — E039 Hypothyroidism, unspecified: Secondary | ICD-10-CM

## 2020-11-04 LAB — VARICELLA ZOSTER ANTIBODY, IGG: Varicella IgG: <135 index — ABNORMAL LOW

## 2020-11-06 ENCOUNTER — Encounter: Payer: Self-pay | Admitting: Family Medicine

## 2020-11-06 MED ORDER — LEVOTHYROXINE SODIUM 150 MCG PO TABS: 150.0000 ug | ORAL_TABLET | Freq: Every day | ORAL | 3 refills | Status: DC

## 2021-01-03 ENCOUNTER — Ambulatory Visit: Payer: Self-pay | Admitting: Gastroenterology

## 2021-01-27 ENCOUNTER — Encounter: Payer: Self-pay | Admitting: Family Medicine

## 2021-01-27 ENCOUNTER — Ambulatory Visit: Payer: Self-pay

## 2021-02-27 ENCOUNTER — Other Ambulatory Visit: Payer: Self-pay

## 2021-02-27 ENCOUNTER — Ambulatory Visit (INDEPENDENT_AMBULATORY_CARE_PROVIDER_SITE_OTHER): Payer: Self-pay

## 2021-02-27 DIAGNOSIS — Z23 Encounter for immunization: Secondary | ICD-10-CM

## 2021-03-10 ENCOUNTER — Encounter: Payer: Self-pay | Admitting: Gastroenterology

## 2021-03-10 ENCOUNTER — Ambulatory Visit (INDEPENDENT_AMBULATORY_CARE_PROVIDER_SITE_OTHER): Payer: Self-pay | Admitting: Gastroenterology

## 2021-03-10 VITALS — BP 124/82 | HR 100 | Ht 61.5 in | Wt 225.0 lb

## 2021-03-10 DIAGNOSIS — K580 Irritable bowel syndrome with diarrhea: Secondary | ICD-10-CM

## 2021-03-10 DIAGNOSIS — Z86010 Personal history of colonic polyps: Secondary | ICD-10-CM

## 2021-03-10 DIAGNOSIS — K625 Hemorrhage of anus and rectum: Secondary | ICD-10-CM

## 2021-03-10 NOTE — Progress Notes (Signed)
[name]    [id number]    19-Apr-1971  Primary Care Physician:Burchette, Alinda Sierras, MD  Referring Physician: Eulas Post, MD [place],  [place] [id number]   Chief complaint:  Diarrhea  HPI: 50 year old very pleasant female here with complaints of intermittent diarrhea and rectal bleeding On and off diarrhea few times a week, also has generalized abdominal pain.  She feels her symptoms are progressively worse in the past 1 to 2 months.  She had an episode of rectal bleeding few weeks ago She injured her left lower leg and is currently wearing a boot   Colonoscopy August 20, 2015 - One 14 mm polyp in the rectum with ulcerated mucosa likely source of rectal bleeding, removed with a hot snare. Resected and retrieved. Clips were placed. - The examination was otherwise normal.   Outpatient Encounter Medications as of 03/10/2021  Medication Sig   acetaminophen (TYLENOL) 500 MG tablet Take 500 mg by mouth daily as needed for mild pain.   Calcium Citrate-Vitamin D (CALCIUM CITRATE + PO) Take 600 mg by mouth daily.   cetirizine-pseudoephedrine (ZYRTEC-D) 5-120 MG tablet Take 1 tablet by mouth daily.   fluticasone (FLONASE) 50 MCG/ACT nasal spray Place 2 sprays into both nostrils daily.   furosemide (LASIX) 20 MG tablet Take 1 tablet by mouth once a day   levothyroxine (SYNTHROID) 150 MCG tablet Take 1 tablet (150 mcg total) by mouth daily.   mirabegron ER (MYRBETRIQ) 50 MG TB24 tablet Take by mouth.   Multiple Vitamins-Iron (ONE DAILY MULTIVITAMIN/IRON PO) Take 1 tablet by mouth daily.    mupirocin ointment (BACTROBAN) 2 % Place 1 application into the nose 2 (two) times daily.   nitroGLYCERIN (NITROSTAT) 0.4 MG SL tablet Place 1 tablet (0.4 mg total) under the tongue every 5 (five) minutes as needed for chest pain.   ondansetron (ZOFRAN-ODT) 8 MG disintegrating tablet DISSOLVE 1 TABLET IN MOUTH EVERY 8 HOURS FOR NAUSEA FOR  UP  TO  7  DAYS    pantoprazole (PROTONIX) 40 MG tablet Take 1 tablet (40 mg total) by mouth daily.   phentermine (ADIPEX-P) 37.5 MG tablet TAKE 1/2 TABLET BY MOUTH TWICE DAILY   Vitamin D, Ergocalciferol, (DRISDOL) 1.25 MG (50000 UNIT) CAPS capsule Take 50,000 Units by mouth once a week.   [DISCONTINUED] Cholecalciferol (VITAMIN D-3 PO) Take 2,000 Int'l Units by mouth daily.    No facility-administered encounter medications on file as of 03/10/2021.    Allergies as of 03/10/2021 - Review Complete 03/10/2021  Allergen Reaction Noted   Augmentin [amoxicillin-pot clavulanate] Shortness Of Breath 04/24/2016    Past Medical History:  Diagnosis Date   Chest pain    GERD (gastroesophageal reflux disease)    Headache    Thyroid disease     Past Surgical History:  Procedure Laterality Date   BARIATRIC SURGERY     CHOLECYSTECTOMY     FOOT SURGERY Left 08/14/2020   galbladder     UMBILICAL HERNIA REPAIR      Family History  Problem Relation Age of Onset   Heart attack Mother    Heart disease Father    Hypertension Father    Diabetes Father    Diabetes Sister    Stroke Neg Hx     Social History   Socioeconomic History   Marital status: Divorced    Spouse name: Not on file   Number of children: Not on file   Years  of education: Not on file   Highest education level: Not on file  Occupational History   Not on file  Tobacco Use   Smoking status: Never   Smokeless tobacco: Never  Vaping Use   Vaping Use: Never used  Substance and Sexual Activity   Alcohol use: No    Alcohol/week: 0.0 standard drinks   Drug use: No   Sexual activity: Never  Other Topics Concern   Not on file  Social History Narrative   Not on file   Social Determinants of Health   Financial Resource Strain: Not on file  Food Insecurity: Not on file  Transportation Needs: Not on file  Physical Activity: Not on file  Stress: Not on file  Social Connections: Not on file  Intimate Partner Violence: Not on file       Review of systems: All other review of systems negative except as mentioned in the HPI.   Physical Exam: Vitals:   03/10/21 1356  BP: 124/82  Pulse: 100   Body mass index is 41.82 kg/m. Gen:      No acute distress HEENT:  sclera anicteric Abd:      soft, non-tender; no palpable masses, no distension Ext:    No edema Neuro: alert and oriented x 3 Psych: normal mood and affect  Data Reviewed:  Reviewed labs, radiology imaging, old records and pertinent past GI work up   Assessment and Plan/Recommendations:  50 year old female with complaints of intermittent diarrhea, abdominal pain and rectal bleeding She had a large hyperplastic ulcerated polyp removed from rectum 5 years ago  We will plan to proceed with colonoscopy for further evaluation of rectal bleeding, exclude neoplastic lesion.  We will also obtain biopsies to exclude colitis The risks and benefits as well as alternatives of endoscopic procedure(s) have been discussed and reviewed. All questions answered. The patient agrees to proceed.  We will do a trial of lactose-free diet, her symptoms may be exacerbated by lactose intolerance  Advised patient to use MiraLAX 1 capful daily as needed to prevent constipation and overflow diarrhea, has history of intermittent constipation though recently has been mostly diarrhea  Further recommendations based on colonoscopy findings  The patient was provided an opportunity to ask questions and all were answered. The patient agreed with the plan and demonstrated an understanding of the instructions.  Damaris Hippo , MD    CC: Eulas Post, MD

## 2021-03-10 NOTE — Patient Instructions (Addendum)
You have been scheduled for a colonoscopy. Please follow written instructions given to you at your visit today.  Please pick up your prep supplies at the pharmacy within the next 1-3 days. If you use inhalers (even only as needed), please bring them with you on the day of your procedure.   2 day colonoscopy prep with Miralax   Take Miralax 1 packet daily  Lactose-Free Diet, Adult If you have lactose intolerance, you are not able to digest lactose. Lactose is a natural sugar found mainly in dairy milk and dairy products. A lactose-free diet can help you avoid foods and beverages that contain lactose. What are tips for following this plan? Reading food labels Do not consume foods, beverages, vitamins, minerals, or medicines containing lactose. Read ingredient lists carefully. Look for the words "lactose-free" on labels. Meal planning Use alternatives to dairy milk and foods made with milk products. These include the following: Lactose-free milk. Soy milk with added calcium and vitamin D. Almond milk, coconut milk, rice milk, or other nondairy milk alternatives with added calcium and vitamin D. Note that a lot of these are low in protein. Soy products, such as soy yogurt, soy cheese, soy ice cream, and soy-based sour cream. Other nut milk products, such as almond yogurt, almond cheese, cashew yogurt, cashew cheese, cashew ice cream, coconut yogurt, and coconut ice cream. Medicines, vitamins, and supplements Use lactase enzyme drops or tablets as directed by your health care provider. Make sure you get enough calcium and vitamin D in your diet. A lactose-free eating plan can be lacking in these important nutrients. Take calcium and vitamin D supplements as directed by your health care provider. Talk with your health care provider about supplements if you are not able to get enough calcium and vitamin D from food. What foods should I eat? Fruits All fresh, canned, frozen, or dried fruits and  fruit juices that are not processed with lactose. Vegetables All fresh, frozen, and canned vegetables without cheese, cream, or butter sauces. Grains Any that are not made with dairy milk or dairy products. Meats and other proteins Any meat, fish, poultry, and other protein sources that are not made with dairy milk or dairy products. Fats and oils Any that are not made with dairy milk or dairy products. Sweets and desserts Any that are not made with dairy milk or dairy products. Seasonings and condiments Any that are not made with dairy milk or dairy products. Calcium Calcium is found in many foods that contain lactose and is important for bone health. The amount of calcium you need depends on your age: Adults younger than 50 years: 1,000 mg of calcium a day. Adults older than 50 years: 1,200 mg of calcium a day. If you are not getting enough calcium, you may get it from other sources, including: Orange juice that has been fortified with calcium. This means that calcium has been added to the product. There are 300-350 mg of calcium in 1 cup (237 mL) of calcium-fortified orange juice. Soy milk fortified with calcium. There are 300-400 mg of calcium in 1 cup (237 mL) of calcium-fortified soy milk. Rice or almond milk fortified with calcium. There are 300 mg of calcium in 1 cup (237 mL) of calcium-fortified rice or almond milk. Breakfast cereals fortified with calcium. There are 100-1,000 mg of calcium in calcium-fortified breakfast cereals. Spinach, cooked. There are 145 mg of calcium in  cup (90 g) of cooked spinach. Edamame, cooked. There are 130 mg of calcium in  cup (47 g) of cooked edamame. Collard greens, cooked. There are 125 mg of calcium in  cup (85 g) of cooked collard greens. Kale, frozen or cooked. There are 90 mg of calcium in  cup (59 g) of cooked or frozen kale. Almonds. There are 95 mg of calcium in  cup (35 g) of almonds. Broccoli, cooked. There are 60 mg of calcium in  1 cup (156 g) of cooked broccoli. The items listed above may not be a complete list of foods and beverages you can eat. Contact a dietitian for more options. What foods should I avoid? Lactose is found in dairy milk and dairy products, such as: Yogurt. Cheese. Butter. Margarine. Sour cream. Cream. Whipped toppings and creamers. Ice cream and other dairy-based desserts. Lactose is also found in foods or products made with dairy milk or milk ingredients. To find out whether a food contains dairy milk or a milk ingredient, look at the ingredients list. Avoid foods with the statement "May contain milk" and foods that contain: Milk powder. Whey. Curd. Lactose. Lactoglobulin. The items listed above may not be a complete list of foods and beverages to avoid. Contact a dietitian for more information. Where to find more information [employer] of Diabetes and Digestive and Kidney Diseases: [email or web address] Summary If you are lactose intolerant, it means that you are not able to digest lactose, a natural sugar found in milk and milk products. Following a lactose-free diet can help you manage this condition. Calcium is important for bone health and is found in many foods that contain lactose. Talk with your health care provider about other sources of calcium. This information is not intended to replace advice given to you by your health care provider. Make sure you discuss any questions you have with your health care provider. Document Revised: 04/02/2020 Document Reviewed: 04/02/2020 Elsevier Patient Education  2022 [employer].  Due to recent changes in healthcare laws, you may see the results of your imaging and laboratory studies on MyChart before your provider has had a chance to review them.  We understand that in some cases there may be results that are confusing or concerning to you. Not all laboratory results come back in the same time frame and the provider may be waiting for  multiple results in order to interpret others.  Please give [place] 48 hours in order for your provider to thoroughly review all the results before contacting the office for clarification of your results.    If you are age 53 or older, your body mass index should be between 23-30. Your Body mass index is 41.82 kg/m. If this is out of the aforementioned range listed, please consider follow up with your Primary Care Provider.  If you are age 91 or younger, your body mass index should be between 19-25. Your Body mass index is 41.82 kg/m. If this is out of the aformentioned range listed, please consider follow up with your Primary Care Provider.   ________________________________________________________  The [facility] providers would like to encourage you to use [facility] to communicate with providers for non-urgent requests or questions.  Due to long hold times on the telephone, sending your provider a message by [facility] may be a faster and more efficient way to get a response.  Please allow 48 business hours for a response.  Please remember that this is for non-urgent requests.  _______________________________________________________   I appreciate the  opportunity to care for you  Thank You   Harl Bowie , MD

## 2021-03-13 ENCOUNTER — Other Ambulatory Visit: Payer: Self-pay | Admitting: Family Medicine

## 2021-03-13 NOTE — Telephone Encounter (Signed)
Please advise. Should the patient continue this medication? 

## 2021-03-14 ENCOUNTER — Encounter: Payer: Self-pay | Admitting: Gastroenterology

## 2021-04-15 ENCOUNTER — Encounter: Payer: Self-pay | Admitting: Gastroenterology

## 2021-04-15 ENCOUNTER — Ambulatory Visit (AMBULATORY_SURGERY_CENTER): Payer: Self-pay | Admitting: Gastroenterology

## 2021-04-15 VITALS — BP 126/55 | HR 74 | Temp 97.3°F | Resp 14 | Ht 61.0 in | Wt 225.0 lb

## 2021-04-15 DIAGNOSIS — Z86010 Personal history of colonic polyps: Secondary | ICD-10-CM

## 2021-04-15 DIAGNOSIS — K621 Rectal polyp: Secondary | ICD-10-CM

## 2021-04-15 DIAGNOSIS — D128 Benign neoplasm of rectum: Secondary | ICD-10-CM

## 2021-04-15 MED ORDER — SODIUM CHLORIDE 0.9 % IV SOLN: 500.0000 mL | Freq: Once | INTRAVENOUS | Status: DC

## 2021-04-15 NOTE — Progress Notes (Signed)
Sedate, gd SR, tolerated procedure well, VSS, report to RN 

## 2021-04-15 NOTE — Progress Notes (Signed)
Called to room to assist during endoscopic procedure.  Patient ID and intended procedure confirmed with present staff. Received instructions for my participation in the procedure from the performing physician.  

## 2021-04-15 NOTE — Patient Instructions (Signed)
Handout on polyps given to patient. Await pathology results. Resume previous diet and continue present medications. Repeat colonoscopy for screening purposes will be determined based off pathology results.   YOU HAD AN ENDOSCOPIC PROCEDURE TODAY AT [facility]:   Refer to the procedure report that was given to you for any specific questions about what was found during the examination.  If the procedure report does not answer your questions, please call your gastroenterologist to clarify.  If you requested that your care partner not be given the details of your procedure findings, then the procedure report has been included in a sealed envelope for you to review at your convenience later.  YOU SHOULD EXPECT: Some feelings of bloating in the abdomen. Passage of more gas than usual.  Walking can help get rid of the air that was put into your GI tract during the procedure and reduce the bloating. If you had a lower endoscopy (such as a colonoscopy or flexible sigmoidoscopy) you may notice spotting of blood in your stool or on the toilet paper. If you underwent a bowel prep for your procedure, you may not have a normal bowel movement for a few days.  Please Note:  You might notice some irritation and congestion in your nose or some drainage.  This is from the oxygen used during your procedure.  There is no need for concern and it should clear up in a day or so.  SYMPTOMS TO REPORT IMMEDIATELY:  Following lower endoscopy (colonoscopy or flexible sigmoidoscopy):  Excessive amounts of blood in the stool  Significant tenderness or worsening of abdominal pains  Swelling of the abdomen that is new, acute  Fever of 100F or higher  For urgent or emergent issues, a gastroenterologist can be reached at any hour by calling [phone]. Do not use MyChart messaging for urgent concerns.    DIET:  We do recommend a small meal at first, but then you may proceed to your regular diet.  Drink  plenty of fluids but you should avoid alcoholic beverages for 24 hours.  ACTIVITY:  You should plan to take it easy for the rest of today and you should NOT DRIVE or use heavy machinery until tomorrow (because of the sedation medicines used during the test).    FOLLOW UP: Our staff will call the number listed on your records 48-72 hours following your procedure to check on you and address any questions or concerns that you may have regarding the information given to you following your procedure. If we do not reach you, we will leave a message.  We will attempt to reach you two times.  During this call, we will ask if you have developed any symptoms of COVID 19. If you develop any symptoms (ie: fever, flu-like symptoms, shortness of breath, cough etc.) before then, please call [phone].  If you test positive for Covid 19 in the 2 weeks post procedure, please call and report this information to [place].    If any biopsies were taken you will be contacted by phone or by letter within the next 1-3 weeks.  Please call us at [phone] if you have not heard about the biopsies in 3 weeks.    SIGNATURES/CONFIDENTIALITY: You and/or your care partner have signed paperwork which will be entered into your electronic medical record.  These signatures attest to the fact that that the information above on your After Visit Summary has been reviewed and is understood.  Full responsibility of the confidentiality  of this discharge information lies with you and/or your care-partner.  

## 2021-04-15 NOTE — Op Note (Signed)
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 The patient's tolerance of                            previous anesthesia was also reviewed. The risks                            and benefits of the procedure and the sedation                            options and risks were discussed with the patient.                            All questions were answered, and informed consent                            was obtained. Prior Anticoagulants: The patient has                            taken no previous anticoagulant or antiplatelet                            agents. ASA Grade Assessment: III - A patient with                            severe systemic disease. After reviewing the risks  and benefits, the patient was deemed in                            satisfactory condition to undergo the procedure.                           After obtaining informed consent, the colonoscope                            was passed under direct vision. Throughout the                            procedure, the patient's blood pressure, pulse, and                            oxygen saturations  were monitored continuously. The                            Olympus PCF-H190DL ([id number]) Colonoscope was                            introduced through the anus and advanced to the the                            cecum, identified by appendiceal orifice and                            ileocecal valve. The colonoscopy was performed                            without difficulty. The patient tolerated the                            procedure well. The quality of the bowel                            preparation was good. The ileocecal valve,                            appendiceal orifice, and rectum were photographed. Scope In: 4:04:36 PM Scope Out: 4:20:19 PM Scope Withdrawal Time: 0 hours 11 minutes 30 seconds  Total Procedure Duration: 0 hours 15 minutes 43 seconds  Findings:                 The perianal and digital rectal examinations were                            normal.                           A 4 mm polyp was found in the rectum. The polyp was                            sessile. The polyp was removed with a cold snare.  Resection and retrieval were complete.                           Non-bleeding external and internal hemorrhoids were                            found during retroflexion. The hemorrhoids were                            medium-sized. Complications:            No immediate complications. Estimated Blood Loss:     Estimated blood loss was minimal. Impression:               - One 4 mm polyp in the rectum, removed with a cold                            snare. Resected and retrieved.                           - Non-bleeding external and internal hemorrhoids. Recommendation:           - Patient has a contact number available for                            emergencies. The signs and symptoms of potential                            delayed complications were discussed with the                            patient. Return to normal activities  tomorrow.                            Written discharge instructions were provided to the                            patient.                           - Resume previous diet.                           - Continue present medications.                           - Await pathology results.                           - Repeat colonoscopy in 5-10 years for surveillance                            based on pathology results. Mauri Pole, MD 04/15/2021 4:23:52 PM This report has been signed electronically.

## 2021-04-15 NOTE — Progress Notes (Signed)
[facility] History and Physical   Primary Care Physician:  Eulas Post, MD   Reason for Procedure:  History of  colon polyps  Plan:    Surveillance colonoscopy with possible interventions as needed     HPI: [name] is a very pleasant 50 y.o. female here for surveillance colonoscopy. Denies any nausea, vomiting, abdominal pain, melena or bright red blood per rectum  She had large ulcerated hyperplastic rectal polyp 14 mm removed in 2017  The risks and benefits as well as alternatives of endoscopic procedure(s) have been discussed and reviewed. All questions answered. The patient agrees to proceed.    Past Medical History:  Diagnosis Date   Chest pain    GERD (gastroesophageal reflux disease)    Headache    Thyroid disease     Past Surgical History:  Procedure Laterality Date   BARIATRIC SURGERY     CHOLECYSTECTOMY     FOOT SURGERY Left 08/14/2020   galbladder     UMBILICAL HERNIA REPAIR      Prior to Admission medications   Medication Sig Start Date End Date Taking? Authorizing Provider  acetaminophen (TYLENOL) 500 MG tablet Take 500 mg by mouth daily as needed for mild pain.    [provider]  Calcium Citrate-Vitamin D (CALCIUM CITRATE + PO) Take 600 mg by mouth daily.    [provider]  cetirizine-pseudoephedrine (ZYRTEC-D) 5-120 MG tablet Take 1 tablet by mouth daily. 06/19/17   Tasia Catchings, Amy V, PA-C  fluticasone (FLONASE) 50 MCG/ACT nasal spray Place 2 sprays into both nostrils daily. 06/19/17   Tasia Catchings, Amy V, PA-C  furosemide (LASIX) 20 MG tablet Take 1 tablet by mouth once a day 04/14/19   Nahser, Wonda Cheng, MD  levothyroxine (SYNTHROID) 150 MCG tablet Take 1 tablet (150 mcg total) by mouth daily. 11/06/20   Burchette, Alinda Sierras, MD  mirabegron ER (MYRBETRIQ) 50 MG TB24 tablet Take by mouth.    [provider]  Multiple Vitamins-Iron (ONE DAILY MULTIVITAMIN/IRON PO) Take 1 tablet by mouth daily.     [provider]   mupirocin ointment (BACTROBAN) 2 % Place 1 application into the nose 2 (two) times daily. 06/09/19   Burchette, Alinda Sierras, MD  nitroGLYCERIN (NITROSTAT) 0.4 MG SL tablet Place 1 tablet (0.4 mg total) under the tongue every 5 (five) minutes as needed for chest pain. 08/24/17   Nahser, Wonda Cheng, MD  ondansetron (ZOFRAN-ODT) 8 MG disintegrating tablet DISSOLVE 1 TABLET BY MOUTH EVERY 8 HOURS AS NEEDED FOR NAUSEA FOR  UP  TO  7  DAYS 03/14/21   Burchette, Alinda Sierras, MD  pantoprazole (PROTONIX) 40 MG tablet Take 1 tablet (40 mg total) by mouth daily. 10/14/18   Burchette, Alinda Sierras, MD  phentermine (ADIPEX-P) 37.5 MG tablet TAKE 1/2 TABLET BY MOUTH TWICE DAILY 06/22/17   [provider]  Vitamin D, Ergocalciferol, (DRISDOL) 1.25 MG (50000 UNIT) CAPS capsule Take 50,000 Units by mouth once a week. 02/16/21   [provider]    Current Outpatient Medications  Medication Sig Dispense Refill   acetaminophen (TYLENOL) 500 MG tablet Take 500 mg by mouth daily as needed for mild pain.     Calcium Citrate-Vitamin D (CALCIUM CITRATE + PO) Take 600 mg by mouth daily.     cetirizine-pseudoephedrine (ZYRTEC-D) 5-120 MG tablet Take 1 tablet by mouth daily. 15 tablet 0   fluticasone (FLONASE) 50 MCG/ACT nasal spray Place 2 sprays into both nostrils daily. 1 g 0   furosemide (  LASIX) 20 MG tablet Take 1 tablet by mouth once a day 30 tablet 0   levothyroxine (SYNTHROID) 150 MCG tablet Take 1 tablet (150 mcg total) by mouth daily. 90 tablet 3   mirabegron ER (MYRBETRIQ) 50 MG TB24 tablet Take by mouth.     Multiple Vitamins-Iron (ONE DAILY MULTIVITAMIN/IRON PO) Take 1 tablet by mouth daily.      mupirocin ointment (BACTROBAN) 2 % Place 1 application into the nose 2 (two) times daily. 22 g 0   nitroGLYCERIN (NITROSTAT) 0.4 MG SL tablet Place 1 tablet (0.4 mg total) under the tongue every 5 (five) minutes as needed for chest pain. 25 tablet 3   ondansetron (ZOFRAN-ODT) 8 MG disintegrating tablet DISSOLVE 1 TABLET  BY MOUTH EVERY 8 HOURS AS NEEDED FOR NAUSEA FOR  UP  TO  7  DAYS 20 tablet 0   pantoprazole (PROTONIX) 40 MG tablet Take 1 tablet (40 mg total) by mouth daily. 90 tablet 3   phentermine (ADIPEX-P) 37.5 MG tablet TAKE 1/2 TABLET BY MOUTH TWICE DAILY     Vitamin D, Ergocalciferol, (DRISDOL) 1.25 MG (50000 UNIT) CAPS capsule Take 50,000 Units by mouth once a week.     No current facility-administered medications for this visit.    Allergies as of 04/15/2021 - Review Complete 03/14/2021  Allergen Reaction Noted   Augmentin [amoxicillin-pot clavulanate] Shortness Of Breath 04/24/2016    Family History  Problem Relation Age of Onset   Heart attack Mother    Heart disease Father    Hypertension Father    Diabetes Father    Diabetes Sister    Stroke Neg Hx     Social History   Socioeconomic History   Marital status: Divorced    Spouse name: Not on file   Number of children: Not on file   Years of education: Not on file   Highest education level: Not on file  Occupational History   Not on file  Tobacco Use   Smoking status: Never   Smokeless tobacco: Never  Vaping Use   Vaping Use: Never used  Substance and Sexual Activity   Alcohol use: No    Alcohol/week: 0.0 standard drinks   Drug use: No   Sexual activity: Never  Other Topics Concern   Not on file  Social History Narrative   Not on file   Social Determinants of Health   Financial Resource Strain: Not on file  Food Insecurity: Not on file  Transportation Needs: Not on file  Physical Activity: Not on file  Stress: Not on file  Social Connections: Not on file  Intimate Partner Violence: Not on file    Review of Systems:  All other review of systems negative except as mentioned in the HPI.  Physical Exam: Vital signs in last 24 hours: BP 117/83   Pulse 75   Temp (!) 97.3 F (36.3 C)   Ht 5\' 1"  (1.549 m)   Wt 225 lb (102.1 kg)   SpO2 98%   BMI 42.51 kg/m  General:   Alert, NAD Lungs:  Clear .   Heart:   Regular rate and rhythm Abdomen:  Soft, nontender and nondistended. Neuro/Psych:  Alert and cooperative. Normal mood and affect. A and O x 3  Reviewed labs, radiology imaging, old records and pertinent past GI work up  Patient is appropriate for planned procedure(s) and anesthesia in an ambulatory setting   K. Denzil Magnuson , MD [phone]

## 2021-04-17 ENCOUNTER — Telehealth: Payer: Self-pay

## 2021-04-17 NOTE — Telephone Encounter (Signed)
First post procedure phone call, no answer 

## 2021-04-22 ENCOUNTER — Encounter: Payer: Self-pay | Admitting: Gastroenterology

## 2021-10-07 ENCOUNTER — Ambulatory Visit: Payer: Self-pay | Admitting: Family Medicine

## 2022-03-18 ENCOUNTER — Telehealth: Payer: Self-pay | Admitting: Family Medicine

## 2022-03-18 DIAGNOSIS — Z87898 Personal history of other specified conditions: Secondary | ICD-10-CM

## 2022-03-18 NOTE — Telephone Encounter (Signed)
Patient stopped by because she is needing to have labs done at our labs. She had another provider order them during her appointment at the [facility]), but if she goes to [place] where the orders were sent it will be too expensive for the patient. Patient states it is cheaper for her to come here since she is self-pay so she would like Dr.Burchette to order them. Orders needed are:   Hemoglobin A1C  Vitamin B1, Whole Blood  Vitamin B12 and Folate  Comp. Metabolic Panel   Vitamin R37 Hydroxy   Ferritin   CBC and Differential    Patient will need a callback for scheduling      Please advise

## 2022-03-19 NOTE — Telephone Encounter (Signed)
Labs ordered and patient scheduled

## 2022-03-20 ENCOUNTER — Other Ambulatory Visit: Payer: Self-pay

## 2022-03-20 ENCOUNTER — Ambulatory Visit (INDEPENDENT_AMBULATORY_CARE_PROVIDER_SITE_OTHER): Payer: Self-pay

## 2022-03-20 DIAGNOSIS — Z87898 Personal history of other specified conditions: Secondary | ICD-10-CM

## 2022-03-20 DIAGNOSIS — Z23 Encounter for immunization: Secondary | ICD-10-CM

## 2022-03-20 LAB — COMPREHENSIVE METABOLIC PANEL
ALT: 11 U/L (ref 0–35)
AST: 13 U/L (ref 0–37)
Albumin: 3.9 g/dL (ref 3.5–5.2)
Alkaline Phosphatase: 108 U/L (ref 39–117)
BUN: 10 mg/dL (ref 6–23)
CO2: 33 mEq/L — ABNORMAL HIGH (ref 19–32)
Calcium: 8.8 mg/dL (ref 8.4–10.5)
Chloride: 96 mEq/L (ref 96–112)
Creatinine, Ser: 0.63 mg/dL (ref 0.40–1.20)
GFR: 102.34 mL/min (ref >60.00)
Glucose, Bld: 152 mg/dL — ABNORMAL HIGH (ref 70–99)
Potassium: 3.2 mEq/L — ABNORMAL LOW (ref 3.5–5.1)
Sodium: 134 mEq/L — ABNORMAL LOW (ref 135–145)
Total Bilirubin: 0.3 mg/dL (ref 0.2–1.2)
Total Protein: 8.3 g/dL (ref 6.0–8.3)

## 2022-03-20 LAB — CBC WITH DIFFERENTIAL/PLATELET
Basophils Absolute: 0.1 10*3/uL (ref 0.0–0.1)
Basophils Relative: 0.8 % (ref 0.0–3.0)
Eosinophils Absolute: 0.2 10*3/uL (ref 0.0–0.7)
Eosinophils Relative: 1.1 % (ref 0.0–5.0)
HCT: 39.6 % (ref 36.0–46.0)
Hemoglobin: 11.8 g/dL — ABNORMAL LOW (ref 12.0–15.0)
Lymphocytes Relative: 26.7 % (ref 12.0–46.0)
Lymphs Abs: 3.9 10*3/uL (ref 0.7–4.0)
MCHC: 29.9 g/dL — ABNORMAL LOW (ref 30.0–36.0)
MCV: 59.7 fl — ABNORMAL LOW (ref 78.0–100.0)
Monocytes Absolute: 0.6 10*3/uL (ref 0.1–1.0)
Monocytes Relative: 4.2 % (ref 3.0–12.0)
Neutro Abs: 9.8 10*3/uL — ABNORMAL HIGH (ref 1.4–7.7)
Neutrophils Relative %: 67.2 % (ref 43.0–77.0)
Platelets: 307 10*3/uL (ref 150.0–400.0)
RBC: 6.63 Mil/uL — ABNORMAL HIGH (ref 3.87–5.11)
RDW: 18.5 % — ABNORMAL HIGH (ref 11.5–15.5)
WBC: 14.5 10*3/uL — ABNORMAL HIGH (ref 4.0–10.5)

## 2022-03-20 LAB — VITAMIN D 25 HYDROXY (VIT D DEFICIENCY, FRACTURES): VITD: 23.77 ng/mL — ABNORMAL LOW (ref 30.00–100.00)

## 2022-03-20 LAB — VITAMIN B12: Vitamin B-12: 930 pg/mL — ABNORMAL HIGH (ref 211–911)

## 2022-03-20 LAB — HEMOGLOBIN A1C: Hgb A1c MFr Bld: 7.6 % — ABNORMAL HIGH (ref 4.6–6.5)

## 2022-03-20 LAB — FERRITIN: Ferritin: 41.2 ng/mL (ref 10.0–291.0)

## 2022-03-25 ENCOUNTER — Encounter: Payer: Self-pay | Admitting: *Deleted

## 2022-03-25 LAB — VITAMIN B1: Vitamin B1 (Thiamine): 6 nmol/L — ABNORMAL LOW (ref 8–30)

## 2022-03-31 ENCOUNTER — Other Ambulatory Visit: Payer: Self-pay | Admitting: Family Medicine

## 2022-03-31 ENCOUNTER — Ambulatory Visit (INDEPENDENT_AMBULATORY_CARE_PROVIDER_SITE_OTHER): Payer: Self-pay | Admitting: Family Medicine

## 2022-03-31 ENCOUNTER — Encounter: Payer: Self-pay | Admitting: Family Medicine

## 2022-03-31 VITALS — BP 120/80 | HR 94 | Temp 98.1°F | Ht 61.0 in | Wt 260.4 lb

## 2022-03-31 DIAGNOSIS — E876 Hypokalemia: Secondary | ICD-10-CM

## 2022-03-31 DIAGNOSIS — E559 Vitamin D deficiency, unspecified: Secondary | ICD-10-CM

## 2022-03-31 DIAGNOSIS — E1165 Type 2 diabetes mellitus with hyperglycemia: Secondary | ICD-10-CM

## 2022-03-31 DIAGNOSIS — E039 Hypothyroidism, unspecified: Secondary | ICD-10-CM

## 2022-03-31 MED ORDER — LEVOTHYROXINE SODIUM 150 MCG PO TABS: 150.0000 ug | ORAL_TABLET | Freq: Every day | ORAL | 3 refills | Status: DC

## 2022-03-31 MED ORDER — VITAMIN D (ERGOCALCIFEROL) 1.25 MG (50000 UNIT) PO CAPS: 50000.0000 [IU] | ORAL_CAPSULE | ORAL | 3 refills | Status: AC

## 2022-03-31 MED ORDER — METFORMIN HCL 500 MG PO TABS: 500.0000 mg | ORAL_TABLET | Freq: Every day | ORAL | 3 refills | Status: DC

## 2022-03-31 NOTE — Progress Notes (Signed)
Established Patient Office Visit  Subjective   Patient ID: [name], female    DOB: [birth year]  Age: 51 y.o. MRN: [id number]  Chief Complaint  Patient presents with   Follow-up    HPI   [name] is seen for medical follow-up.  She has sent request last week for multiple labs at the request of her general surgeon who did her gastric bypass surgery years ago.  Because of her lack of insurance we agreed to do those labs here.  They came back significant for slightly low B1, A1c 7.6, slightly low potassium 3.2, vitamin D level 23, normal ferritin, mild elevated white count which is chronically elevated.  She has microcytic changes on her cells likely related to her thalassemia history.  She has had she states 12 surgeries over the past year on her left foot following complicated fracture.  She has another surgery scheduled in December.  In addition to issues above she has history of obesity, obstructive sleep apnea, GERD, hypothyroidism, degenerative arthritis She has not had TSH in over a year.  Regarding her diabetes, she states she does drink a lot of sodas.  She had previously been on metformin but quit taking that.  She did tolerate without side effects.  She lives in apartment and helps take care of her 72 year old dad.  She has been basically wheelchair-bound for the most part for the past year.  Minimal ambulation.  She is requesting handicap card  Past Medical History:  Diagnosis Date   Chest pain    GERD (gastroesophageal reflux disease)    Headache    Thyroid disease    Past Surgical History:  Procedure Laterality Date   BARIATRIC SURGERY     CHOLECYSTECTOMY     FOOT SURGERY Left 08/14/2020   galbladder     UMBILICAL HERNIA REPAIR      reports that she has never smoked. She has never used smokeless tobacco. She reports that she does not drink alcohol and does not use drugs. family history includes Diabetes in her father and sister; Heart attack in her mother;  Heart disease in her father; Hypertension in her father. Allergies  Allergen Reactions   Augmentin [Amoxicillin-Pot Clavulanate] Shortness Of Breath   Pork-Derived Products Other (See Comments)    Religous     Review of Systems  Constitutional:  Negative for malaise/fatigue.  Eyes:  Negative for blurred vision.  Respiratory:  Negative for shortness of breath.   Cardiovascular:  Negative for chest pain.  Gastrointestinal:  Negative for abdominal pain.  Neurological:  Negative for dizziness, weakness and headaches.      Objective:     BP 120/80 (BP Location: Left Arm, Patient Position: Sitting, Cuff Size: Large)   Pulse 94   Temp 98.1 F (36.7 C) (Oral)   Ht '5\' 1"'$  (1.549 m)   Wt 260 lb 6.4 oz (118.1 kg)   SpO2 97%   BMI 49.20 kg/m    Physical Exam Constitutional:      Appearance: She is well-developed.  Eyes:     Pupils: Pupils are equal, round, and reactive to light.  Neck:     Thyroid: No thyromegaly.     Vascular: No JVD.  Cardiovascular:     Rate and Rhythm: Normal rate and regular rhythm.     Heart sounds:     No gallop.  Pulmonary:     Effort: Pulmonary effort is normal. No respiratory distress.     Breath sounds: Normal breath sounds. No wheezing  or rales.  Musculoskeletal:     Cervical back: Neck supple.     Comments: She has a cam type boot on her left lower extremity  Neurological:     Mental Status: She is alert.      No results found for any visits on 03/31/22.    The 10-year ASCVD risk score (Arnett DK, et al., 2019) is: 2.6%    Assessment & Plan:   #1 type 2 diabetes with recent A1c 7.6%.  Patient currently not treated.  Start metformin 500 mg once daily.  Scale back sugars and sodas.  She is currently drink a lot of sodas and fruit juices.  Set up 38-monthfollow-up.  #2 hypothyroidism.  Overdue for TSH.  TSH will be drawn today.  Refill levothyroxine for 1 year  #3 recent mild hypokalemia.  Discussed potassium rich diet.  Increase  potassium rich foods and recheck basic metabolic panel at follow-up  #4 low vitamin D and patient with history of gastric bypass.  Get back on vitamin D 50,000 international units once weekly and consider recheck 25-hydroxy vitamin D level at follow-up  #5 low vitamin B1.  Patient states she has not been taking her multivitamin.  She will get back on that daily  #6 patient requesting handicap forms to be completed.  Because of her anticipated future surgeries we went ahead and fill this out for 5 years.  She states she was told by surgeon it would take at least couple years before she could be on full weightbearing.   Return in about 3 months (around 07/01/2022).    BCarolann Littler MD

## 2022-03-31 NOTE — Patient Instructions (Signed)
Get back on the Vitamin D and multivitamin.   Increase potasium rich foods  Reduce sodas and juices.  Start back the Metformin 500 mg once daily.    Let's plan on 3 month follow up.

## 2022-04-01 LAB — TSH: TSH: 2.14 u[IU]/mL (ref 0.35–5.50)

## 2022-05-20 ENCOUNTER — Encounter: Payer: Self-pay | Admitting: Family Medicine

## 2022-05-22 ENCOUNTER — Encounter: Payer: Self-pay | Admitting: Family Medicine

## 2022-05-22 ENCOUNTER — Ambulatory Visit (INDEPENDENT_AMBULATORY_CARE_PROVIDER_SITE_OTHER): Payer: Self-pay | Admitting: Family Medicine

## 2022-05-22 ENCOUNTER — Other Ambulatory Visit: Payer: Self-pay

## 2022-05-22 VITALS — BP 120/86 | HR 82 | Temp 98.5°F | Ht 61.0 in

## 2022-05-22 DIAGNOSIS — R252 Cramp and spasm: Secondary | ICD-10-CM

## 2022-05-22 DIAGNOSIS — E1165 Type 2 diabetes mellitus with hyperglycemia: Secondary | ICD-10-CM

## 2022-05-22 DIAGNOSIS — E876 Hypokalemia: Secondary | ICD-10-CM

## 2022-05-22 DIAGNOSIS — R7989 Other specified abnormal findings of blood chemistry: Secondary | ICD-10-CM

## 2022-05-22 LAB — BASIC METABOLIC PANEL
BUN: 8 mg/dL (ref 6–23)
CO2: 29 mEq/L (ref 19–32)
Calcium: 9.1 mg/dL (ref 8.4–10.5)
Chloride: 99 mEq/L (ref 96–112)
Creatinine, Ser: 0.59 mg/dL (ref 0.40–1.20)
GFR: 103.85 mL/min (ref >60.00)
Glucose, Bld: 106 mg/dL — ABNORMAL HIGH (ref 70–99)
Potassium: 4 mEq/L (ref 3.5–5.1)
Sodium: 134 mEq/L — ABNORMAL LOW (ref 135–145)

## 2022-05-22 LAB — MAGNESIUM: Magnesium: 2.2 mg/dL (ref 1.5–2.5)

## 2022-05-22 MED ORDER — ONDANSETRON 8 MG PO TBDP: ORAL_TABLET | ORAL | 1 refills | Status: DC

## 2022-05-22 MED ORDER — METFORMIN HCL 500 MG PO TABS: 500.0000 mg | ORAL_TABLET | Freq: Two times a day (BID) | ORAL | 3 refills | Status: DC

## 2022-05-22 NOTE — Patient Instructions (Signed)
Try to increase fluid intake  Increase the Metformin 500 mg twice daily.

## 2022-05-22 NOTE — Progress Notes (Signed)
Established Patient Office Visit  Subjective   Patient ID: [name], female    DOB: May 17, 1970  Age: 52 y.o. MRN: [id number]  Chief Complaint  Patient presents with   Dysmenorrhea    X3 weeks     HPI   [name] has history of morbid obesity with prior gastric bypass surgery years ago.  Her previous surgeon had requested multiple labs which she came in for recently.  She had multiple abnormalities including hemoglobin 11.8 with MCV of 59 with ferritin of 41.  Known history of thalassemia minor.  Vitamin D level 23 and vitamin B1 was low at 6.  Her potassium was 3.2.  A1c 7.6%.  Diabetes currently treated with metformin 500 mg once daily.  Does not monitor blood sugars regularly.  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  She relates recent increased muscle cramps trunk and upper and lower extremities.  She has had multiple surgeries left foot and ankle and has been very debilitated and active because of that for the past couple years.  She generally drinks about 1-1 and half liters of fluid per day.  Past Medical History:  Diagnosis Date   Chest pain    GERD (gastroesophageal reflux disease)    Headache    Thyroid disease    Past Surgical History:  Procedure Laterality Date   BARIATRIC SURGERY     CHOLECYSTECTOMY     FOOT SURGERY Left 08/14/2020   galbladder     UMBILICAL HERNIA REPAIR      reports that she has never smoked. She has never used smokeless tobacco. She reports that she does not drink alcohol and does not use drugs. family history includes Diabetes in her father and sister; Heart attack in her mother; Heart disease in her father; Hypertension in her father. Allergies  Allergen Reactions   Augmentin [Amoxicillin-Pot Clavulanate] Shortness Of Breath   Pork-Derived Products Other (See Comments)    Religous     Review of Systems  Constitutional:  Positive for malaise/fatigue. Negative for chills and fever.  Cardiovascular:  Negative for chest  pain.  Gastrointestinal:  Negative for abdominal pain.  Genitourinary:  Negative for dysuria.      Objective:     BP 120/86 (BP Location: Left Arm, Patient Position: Sitting, Cuff Size: Large)   Pulse 82   Temp 98.5 F (36.9 C) (Oral)   Ht '5\' 1"'$  (1.549 m)   SpO2 95%   BMI 49.20 kg/m    Physical Exam Vitals reviewed.  Cardiovascular:     Rate and Rhythm: Normal rate and regular rhythm.  Pulmonary:     Effort: Pulmonary effort is normal.     Breath sounds: Normal breath sounds.  Neurological:     Mental Status: She is alert.      No results found for any visits on 05/22/22.    The 10-year ASCVD risk score (Arnett DK, et al., 2019) is: 2.8%    Assessment & Plan:   #1 type 2 diabetes suboptimally controlled with recent A1c 7.6%.  Increase metformin to 500 mg twice daily.  Follow low glycemic diet.  Recheck A1c in a couple months.  She does get regular eye checkups.  Consider urine microalbumin in the near future  #2 hypokalemia.  No diuretic use.  Her surgeon apparently did not address her recent low potassium and this will be repeated today.  If still down consider replacement and also encouraged to eat high potassium diet  #3 frequent muscle cramps.  Increase water consumption.  Recheck potassium and also check magnesium.  #4 chronic microcytic anemia.  Recent normal ferritin.  This is likely related to her thalassemia minor.  #5 low B1 level.  Patient apparently was not taking her B vitamins regularly at time of labs and now back on them   No follow-ups on file.    Carolann Littler, MD

## 2022-05-22 NOTE — Addendum Note (Signed)
Addended by: Octavio Manns E on: 05/22/2022 02:07 PM   Modules accepted: Orders

## 2022-07-01 ENCOUNTER — Encounter: Payer: Self-pay | Admitting: Family Medicine

## 2022-07-01 ENCOUNTER — Ambulatory Visit (INDEPENDENT_AMBULATORY_CARE_PROVIDER_SITE_OTHER): Payer: Self-pay | Admitting: Family Medicine

## 2022-07-01 VITALS — BP 130/90 | HR 99 | Temp 98.7°F | Ht 61.0 in | Wt 253.3 lb

## 2022-07-01 DIAGNOSIS — E1165 Type 2 diabetes mellitus with hyperglycemia: Secondary | ICD-10-CM

## 2022-07-01 LAB — POCT GLYCOSYLATED HEMOGLOBIN (HGB A1C): Hemoglobin A1C: 6.7 % — AB (ref 4.0–5.6)

## 2022-07-01 NOTE — Patient Instructions (Signed)
A1C improved to 6.7%

## 2022-07-01 NOTE — Progress Notes (Signed)
Established Patient Office Visit  Subjective   Patient ID: [name], female    DOB: 04-27-1971  Age: 52 y.o. MRN: [id number]  Chief Complaint  Patient presents with   Medical Management of Chronic Issues    HPI   Nadeem is seen for follow-up type 2 diabetes.  We had increased her metformin to twice daily last visit after A1c of 7.6%.  A1c is improved today.  She has had diabetes education previously.  She has been very limited with activity because of ongoing left lower extremity predicament.  She had bilateral ankle fractures few years ago and has had multiple surgeries left lower extremity with revision surgery this Friday at [facility].  Past Medical History:  Diagnosis Date   Chest pain    GERD (gastroesophageal reflux disease)    Headache    Thyroid disease    Past Surgical History:  Procedure Laterality Date   BARIATRIC SURGERY     CHOLECYSTECTOMY     FOOT SURGERY Left 08/14/2020   galbladder     UMBILICAL HERNIA REPAIR      reports that she has never smoked. She has never used smokeless tobacco. She reports that she does not drink alcohol and does not use drugs. family history includes Diabetes in her father and sister; Heart attack in her mother; Heart disease in her father; Hypertension in her father. Allergies  Allergen Reactions   Augmentin [Amoxicillin-Pot Clavulanate] Shortness Of Breath   Pork-Derived Products Other (See Comments)    Religous     Review of Systems  Constitutional:  Negative for chills, fever and malaise/fatigue.  Eyes:  Negative for blurred vision.  Respiratory:  Negative for shortness of breath.   Cardiovascular:  Negative for chest pain.  Neurological:  Negative for dizziness, weakness and headaches.      Objective:     BP (!) 130/90 (BP Location: Left Arm, Patient Position: Sitting, Cuff Size: Large)   Pulse 99   Temp 98.7 F (37.1 C) (Oral)   Ht 5' 1"$  (1.549 m)   Wt 253 lb 4.8 oz (114.9 kg)   SpO2 98%   BMI 47.86 kg/m   BP Readings from Last 3 Encounters:  07/01/22 (!) 130/90  05/22/22 120/86  03/31/22 120/80   Wt Readings from Last 3 Encounters:  07/01/22 253 lb 4.8 oz (114.9 kg)  03/31/22 260 lb 6.4 oz (118.1 kg)  04/15/21 225 lb (102.1 kg)      Physical Exam Vitals reviewed.  Cardiovascular:     Rate and Rhythm: Normal rate and regular rhythm.  Pulmonary:     Effort: Pulmonary effort is normal.     Breath sounds: Normal breath sounds. No wheezing or rales.      Results for orders placed or performed in visit on 07/01/22  POC HgB A1c  Result Value Ref Range   Hemoglobin A1C 6.7 (A) 4.0 - 5.6 %   HbA1c POC (<> result, manual entry)     HbA1c, POC (prediabetic range)     HbA1c, POC (controlled diabetic range)      Last CBC Lab Results  Component Value Date   WBC 14.5 (H) 03/20/2022   HGB 11.8 (L) 03/20/2022   HCT 39.6 03/20/2022   MCV 59.7 Repeated and verified X2. (L) 03/20/2022   MCH 18.8 (L) 10/28/2018   RDW 18.5 (H) 03/20/2022   PLT 307.0 [id number]   Last metabolic panel Lab Results  Component Value Date   GLUCOSE 106 (H) 05/22/2022   NA 134 (  L) 05/22/2022   K 4.0 05/22/2022   CL 99 05/22/2022   CO2 29 05/22/2022   BUN 8 05/22/2022   CREATININE 0.59 05/22/2022   GFRNONAA 113 09/27/2017   CALCIUM 9.1 05/22/2022   PROT 8.3 03/20/2022   ALBUMIN 3.9 03/20/2022   BILITOT 0.3 03/20/2022   ALKPHOS 108 03/20/2022   AST 13 03/20/2022   ALT 11 03/20/2022   Last lipids Lab Results  Component Value Date   CHOL 163 11/01/2020   HDL 40.20 11/01/2020   LDLCALC 98 11/01/2020   TRIG 127.0 11/01/2020   CHOLHDL 4 11/01/2020   Last hemoglobin A1c Lab Results  Component Value Date   HGBA1C 6.7 (A) 07/01/2022   Last thyroid functions Lab Results  Component Value Date   TSH 2.14 03/31/2022      The 10-year ASCVD risk score (Arnett DK, et al., 2019) is: 3.3%    Assessment & Plan:   Problem List Items Addressed This Visit       Unprioritized   Type 2 diabetes  mellitus with hyperglycemia ([place]) - Primary   Relevant Orders   POC HgB A1c (Completed)  Type 2 diabetes improved with A1c today 6.7%.  Continue metformin 500 mg twice daily.  Continue low glycemic diet.  Set up 61-monthfollow-up.  Reminder for annual diabetic eye exam.  Check urine microalbumin at follow-up Hopefully she can increase her activity levels after she gets over her upcoming surgery  Return in about 4 months (around 10/30/2022).    BCarolann Littler MD

## 2022-11-20 ENCOUNTER — Encounter: Payer: Self-pay | Admitting: Family Medicine

## 2022-11-20 ENCOUNTER — Ambulatory Visit (INDEPENDENT_AMBULATORY_CARE_PROVIDER_SITE_OTHER): Payer: Self-pay | Admitting: Family Medicine

## 2022-11-20 VITALS — BP 100/64 | HR 70 | Temp 98.2°F | Ht 61.0 in

## 2022-11-20 DIAGNOSIS — R300 Dysuria: Secondary | ICD-10-CM

## 2022-11-20 DIAGNOSIS — E038 Other specified hypothyroidism: Secondary | ICD-10-CM

## 2022-11-20 DIAGNOSIS — E1165 Type 2 diabetes mellitus with hyperglycemia: Secondary | ICD-10-CM

## 2022-11-20 DIAGNOSIS — Z7984 Long term (current) use of oral hypoglycemic drugs: Secondary | ICD-10-CM

## 2022-11-20 LAB — POC URINALSYSI DIPSTICK (AUTOMATED)
Bilirubin, UA: NEGATIVE
Blood, UA: NEGATIVE
Glucose, UA: NEGATIVE
Ketones, UA: NEGATIVE
Leukocytes, UA: NEGATIVE
Nitrite, UA: NEGATIVE
Protein, UA: NEGATIVE
Spec Grav, UA: 1.01 (ref 1.010–1.025)
Urobilinogen, UA: 0.2 E.U./dL
pH, UA: 6.5 (ref 5.0–8.0)

## 2022-11-20 LAB — POCT GLYCOSYLATED HEMOGLOBIN (HGB A1C): Hemoglobin A1C: 6.9 % — AB (ref 4.0–5.6)

## 2022-11-20 NOTE — Progress Notes (Unsigned)
Established Patient Office Visit  Subjective   Patient ID: [name], female    DOB: Apr 24, 1971  Age: 52 y.o. MRN: [id number]  Chief Complaint  Patient presents with   Medical Management of Chronic Issues    HPI  {History (Optional):23778} [name] seen today for the following issues  She notes little odor with her urine past couple days and then this morning had some mild burning with urination which prompted her setting up follow-up.  No fever.  No chills.  No flank pain.  No gross hematuria. Denies any recent bladder instrumentation.  Type 2 diabetes.  Last A1c 5 months ago was 6.7%.  She remains on metformin.  She had previous gastric bypass surgery.  She has hypothyroidism treated with levothyroxine 150 mcg daily.  Last TSH was last November.  History of complicated fracture left ankle with multiple prior surgeries at [facility].  She is still in a walking boot and very frustrated with slow healing.  Past Medical History:  Diagnosis Date   Chest pain    GERD (gastroesophageal reflux disease)    Headache    Thyroid disease    Past Surgical History:  Procedure Laterality Date   BARIATRIC SURGERY     CHOLECYSTECTOMY     FOOT SURGERY Left 08/14/2020   galbladder     UMBILICAL HERNIA REPAIR      reports that she has never smoked. She has never used smokeless tobacco. She reports that she does not drink alcohol and does not use drugs. family history includes Diabetes in her father and sister; Heart attack in her mother; Heart disease in her father; Hypertension in her father. Allergies  Allergen Reactions   Augmentin [Amoxicillin-Pot Clavulanate] Shortness Of Breath   Pork-Derived Products Other (See Comments)    Religous     Review of Systems  Constitutional:  Negative for chills and fever.  Cardiovascular:  Negative for chest pain.  Genitourinary:  Positive for dysuria. Negative for flank pain and hematuria.      Objective:     BP 100/64 (BP Location: Left  Arm, Patient Position: Sitting, Cuff Size: Large)   Pulse 70   Temp 98.2 F (36.8 C) (Oral)   Ht 5\' 1"  (1.549 m)   SpO2 98%   BMI 47.86 kg/m  BP Readings from Last 3 Encounters:  11/20/22 100/64  07/01/22 (!) 130/90  05/22/22 120/86   Wt Readings from Last 3 Encounters:  07/01/22 253 lb 4.8 oz (114.9 kg)  03/31/22 260 lb 6.4 oz (118.1 kg)  04/15/21 225 lb (102.1 kg)      Physical Exam Vitals reviewed.  Constitutional:      Appearance: Normal appearance.  Cardiovascular:     Rate and Rhythm: Normal rate and regular rhythm.  Pulmonary:     Effort: Pulmonary effort is normal.     Breath sounds: Normal breath sounds.  Neurological:     Mental Status: She is alert.      No results found for any visits on 11/20/22.  Last CBC Lab Results  Component Value Date   WBC 14.5 (H) 03/20/2022   HGB 11.8 (L) 03/20/2022   HCT 39.6 03/20/2022   MCV 59.7 Repeated and verified X2. (L) 03/20/2022   MCH 18.8 (L) 10/28/2018   RDW 18.5 (H) 03/20/2022   PLT 307.0 03/20/2022   Last metabolic panel Lab Results  Component Value Date   GLUCOSE 106 (H) 05/22/2022   NA 134 (L) 05/22/2022   K 4.0 05/22/2022   CL  99 05/22/2022   CO2 29 05/22/2022   BUN 8 05/22/2022   CREATININE 0.59 05/22/2022   GFR 103.85 05/22/2022   CALCIUM 9.1 05/22/2022   PROT 8.3 03/20/2022   ALBUMIN 3.9 03/20/2022   BILITOT 0.3 03/20/2022   ALKPHOS 108 03/20/2022   AST 13 03/20/2022   ALT 11 03/20/2022   Last lipids Lab Results  Component Value Date   CHOL 163 11/01/2020   HDL 40.20 11/01/2020   LDLCALC 98 11/01/2020   TRIG 127.0 11/01/2020   CHOLHDL 4 11/01/2020   Last hemoglobin A1c Lab Results  Component Value Date   HGBA1C 6.7 (A) 07/01/2022   Last thyroid functions Lab Results  Component Value Date   TSH 2.14 03/31/2022      The 10-year ASCVD risk score (Arnett DK, et al., 2019) is: 2%    Assessment & Plan:   #1 dysuria.  Rule out UTI.  Check urine dipstick  #2 type 2  diabetes.  History of fairly good control.  Recheck A1c today.  #3 hypothyroidism.  Patient on levothyroxine 150 mcg daily.  Last TSH in November was normal   No follow-ups on file.    Evelena Peat, MD

## 2022-11-20 NOTE — Patient Instructions (Signed)
A1C today stable at 6.9%  Urinalysis shows no signs of infection.

## 2022-12-17 ENCOUNTER — Emergency Department (HOSPITAL_COMMUNITY): Payer: Self-pay

## 2022-12-17 ENCOUNTER — Encounter (HOSPITAL_COMMUNITY): Payer: Self-pay

## 2022-12-17 ENCOUNTER — Emergency Department (HOSPITAL_COMMUNITY)
Admission: EM | Admit: 2022-12-17 | Discharge: 2022-12-17 | Disposition: A | Discharge status: Home / Self Care | Payer: Self-pay | Attending: Emergency Medicine | Admitting: Emergency Medicine

## 2022-12-17 DIAGNOSIS — R519 Headache, unspecified: Secondary | ICD-10-CM | POA: Insufficient documentation

## 2022-12-17 LAB — COMPREHENSIVE METABOLIC PANEL
ALT: 23 U/L (ref 0–44)
AST: 20 U/L (ref 15–41)
Albumin: 3.9 g/dL (ref 3.5–5.0)
Alkaline Phosphatase: 81 U/L (ref 38–126)
Anion gap: 8 (ref 5–15)
BUN: 12 mg/dL (ref 6–20)
CO2: 27 mmol/L (ref 22–32)
Calcium: 8.8 mg/dL — ABNORMAL LOW (ref 8.9–10.3)
Chloride: 99 mmol/L (ref 98–111)
Creatinine, Ser: 0.58 mg/dL (ref 0.44–1.00)
GFR, Estimated: >60 mL/min (ref >60)
Glucose, Bld: 103 mg/dL — ABNORMAL HIGH (ref 70–99)
Potassium: 4 mmol/L (ref 3.5–5.1)
Sodium: 134 mmol/L — ABNORMAL LOW (ref 135–145)
Total Bilirubin: 0.5 mg/dL (ref 0.3–1.2)
Total Protein: 8.9 g/dL — ABNORMAL HIGH (ref 6.5–8.1)

## 2022-12-17 LAB — CBC WITH DIFFERENTIAL/PLATELET
Abs Immature Granulocytes: 0.06 10*3/uL (ref 0.00–0.07)
Basophils Absolute: 0.1 10*3/uL (ref 0.0–0.1)
Basophils Relative: 1 %
Eosinophils Absolute: 0.1 10*3/uL (ref 0.0–0.5)
Eosinophils Relative: 1 %
HCT: 41.6 % (ref 36.0–46.0)
Hemoglobin: 11.8 g/dL — ABNORMAL LOW (ref 12.0–15.0)
Immature Granulocytes: 0 %
Lymphocytes Relative: 28 %
Lymphs Abs: 3.8 10*3/uL (ref 0.7–4.0)
MCH: 17 pg — ABNORMAL LOW (ref 26.0–34.0)
MCHC: 28.4 g/dL — ABNORMAL LOW (ref 30.0–36.0)
MCV: 59.9 fL — ABNORMAL LOW (ref 80.0–100.0)
Monocytes Absolute: 0.4 10*3/uL (ref 0.1–1.0)
Monocytes Relative: 3 %
Neutro Abs: 9.4 10*3/uL — ABNORMAL HIGH (ref 1.7–7.7)
Neutrophils Relative %: 67 %
Platelets: 330 10*3/uL (ref 150–400)
RBC: 6.95 MIL/uL — ABNORMAL HIGH (ref 3.87–5.11)
RDW: 19.6 % — ABNORMAL HIGH (ref 11.5–15.5)
WBC: 13.9 10*3/uL — ABNORMAL HIGH (ref 4.0–10.5)
nRBC: 0 % (ref 0.0–0.2)

## 2022-12-17 MED ORDER — KETOROLAC TROMETHAMINE 30 MG/ML IJ SOLN
30.0000 mg | Freq: Once | INTRAMUSCULAR | Status: AC
  Administered 2022-12-17: 30 mg via INTRAMUSCULAR
  Filled 2022-12-17: qty 1

## 2022-12-17 MED ORDER — DIPHENHYDRAMINE HCL 25 MG PO CAPS
25.0000 mg | ORAL_CAPSULE | Freq: Once | ORAL | Status: AC
  Administered 2022-12-17: 25 mg via ORAL
  Filled 2022-12-17: qty 1

## 2022-12-17 MED ORDER — METOCLOPRAMIDE HCL 10 MG PO TABS
10.0000 mg | ORAL_TABLET | Freq: Once | ORAL | Status: AC
  Administered 2022-12-17: 10 mg via ORAL
  Filled 2022-12-17: qty 1

## 2022-12-17 MED ORDER — DEXAMETHASONE 4 MG PO TABS
6.0000 mg | ORAL_TABLET | Freq: Once | ORAL | Status: AC
  Administered 2022-12-17: 6 mg via ORAL
  Filled 2022-12-17: qty 1

## 2022-12-17 NOTE — Discharge Instructions (Addendum)
Thank you for letting us take care of you today.  There were no significant abnormalities in your blood work and the CT of your head was normal.  We gave you medications to treat your headache which sounds most consistent with a migraine.  Follow-up with your PCP in the next week to discuss your ED visit today and any continued symptoms.  If you develop new or worsening symptoms such as severe headache of sudden onset, fevers, severe neck pain, weakness on one side of your body, loss of vision, or other new, concerning symptoms, return to the nearest ED for reevaluation.

## 2022-12-17 NOTE — ED Provider Triage Note (Signed)
Emergency Medicine Provider Triage Evaluation Note  [name] , a 52 y.o. female  was evaluated in triage.  Pt complains of headache, left side, onset 7 days ago, constant, no hx of similar headaches, pounding in nature. 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 Today noted blurry vision in left eye.  With phonophobia and photophobia Wears glasses Review of Systems  Positive: Nausea, headache, blurry vision Negative: Vomiting, unilateral weakness or numbness   Physical Exam  BP 110/70 (BP Location: Right Arm)   Pulse 87   Temp 98.4 F (36.9 C) (Oral)   Resp 16   SpO2 100%  Gen:   Awake, no distress   Resp:  Normal effort  MSK:   Moves extremities without difficulty  Other:    Medical Decision Making  Medically screening exam initiated at 1:24 PM.  Appropriate orders placed.  Alexx A Sewell was informed that the remainder of the evaluation will be completed by another provider, this initial triage assessment does not replace that evaluation, and the importance of remaining in the ED until their evaluation is complete.     Jeannie Fend, PA-C 12/17/22 1326

## 2022-12-17 NOTE — ED Provider Notes (Signed)
[place] EMERGENCY DEPARTMENT AT [facility] Provider Note   CSN: [id number] Arrival date & time: 12/17/22  1252     History  Chief Complaint  Patient presents with   Headache    [name] is a 52 y.o. female with past medical history GERD, thyroid disease who presents to the ED complaining of an 8/10 left-sided headache with photophobia, phonophobia, and left-sided blurry vision that began about 8 days ago.  No fall or head injury.  No history of similar headaches.  No neck pain, fever, chest pain, shortness of breath, diplopia/visual field cuts, nausea, or vomiting.  She has tried Tylenol at home without relief of headache.  No history of diabetes or chronic kidney disease.  No recent cough, congestion, body aches, or known sick contacts.      Home Medications Prior to Admission medications   Medication Sig Start Date End Date Taking? Authorizing Provider  acetaminophen (TYLENOL) 500 MG tablet Take 500 mg by mouth daily as needed for mild pain.    [provider]  Calcium Citrate-Vitamin D (CALCIUM CITRATE + PO) Take 600 mg by mouth daily.    [provider]  levothyroxine (SYNTHROID) 150 MCG tablet Take 1 tablet (150 mcg total) by mouth daily. 03/31/22   Burchette, Elberta Fortis, MD  metFORMIN (GLUCOPHAGE) 500 MG tablet Take 1 tablet (500 mg total) by mouth 2 (two) times daily with a meal. 05/22/22   Burchette, Elberta Fortis, MD  mirabegron ER (MYRBETRIQ) 50 MG TB24 tablet Take by mouth.    [provider]  Multiple Vitamins-Iron (ONE DAILY MULTIVITAMIN/IRON PO) Take 1 tablet by mouth daily.     [provider]  mupirocin ointment (BACTROBAN) 2 % Place 1 application into the nose 2 (two) times daily. 06/09/19   Burchette, Elberta Fortis, MD  nitroGLYCERIN (NITROSTAT) 0.4 MG SL tablet Place 1 tablet (0.4 mg total) under the tongue every 5 (five) minutes as needed for chest pain. 08/24/17   Nahser, Deloris Ping, MD  ondansetron (ZOFRAN-ODT) 8 MG  disintegrating tablet DISSOLVE 1 TABLET BY MOUTH EVERY 8 HOURS AS NEEDED FOR NAUSEA FOR  UP  TO  7  DAYS 05/22/22   Burchette, Elberta Fortis, MD  pantoprazole (PROTONIX) 40 MG tablet Take 1 tablet (40 mg total) by mouth daily. 10/14/18   Burchette, Elberta Fortis, MD  phentermine (ADIPEX-P) 37.5 MG tablet TAKE 1/2 TABLET BY MOUTH TWICE DAILY 06/22/17   [provider]  Vitamin D, Ergocalciferol, (DRISDOL) 1.25 MG (50000 UNIT) CAPS capsule Take 1 capsule (50,000 Units total) by mouth once a week. 03/31/22   Burchette, Elberta Fortis, MD      Allergies    Augmentin [amoxicillin-pot clavulanate] and Pork-derived products    Review of Systems   Review of Systems  All other systems reviewed and are negative.   Physical Exam Updated Vital Signs BP 110/70 (BP Location: Right Arm)   Pulse 87   Temp 98.4 F (36.9 C) (Oral)   Resp 16   SpO2 100%  Physical Exam Vitals and nursing note reviewed.  Constitutional:      General: She is not in acute distress.    Appearance: Normal appearance. She is not toxic-appearing or diaphoretic.  HENT:     Head: Normocephalic and atraumatic.     Mouth/Throat:     Mouth: Mucous membranes are moist.  Eyes:     Extraocular Movements: Extraocular movements intact.     Right eye: No nystagmus.     Left eye:  No nystagmus.     Conjunctiva/sclera: Conjunctivae normal.     Pupils: Pupils are equal, round, and reactive to light.  Neck:     Comments: No meningismus Cardiovascular:     Rate and Rhythm: Normal rate and regular rhythm.     Heart sounds: No murmur heard. Pulmonary:     Effort: Pulmonary effort is normal.     Breath sounds: Normal breath sounds.  Abdominal:     General: Abdomen is flat.     Palpations: Abdomen is soft.     Tenderness: There is no abdominal tenderness.  Musculoskeletal:        General: Normal range of motion.     Cervical back: Normal range of motion and neck supple.     Right lower leg: No edema.     Left lower leg: No edema.  Skin:     General: Skin is warm and dry.     Capillary Refill: Capillary refill takes less than 2 seconds.  Neurological:     General: No focal deficit present.     Mental Status: She is alert and oriented to person, place, and time.     GCS: GCS eye subscore is 4. GCS verbal subscore is 5. GCS motor subscore is 6.     Cranial Nerves: Cranial nerves 2-12 are intact. No cranial nerve deficit, dysarthria or facial asymmetry.     Sensory: Sensation is intact.     Motor: Motor function is intact. No weakness, tremor, atrophy, abnormal muscle tone or seizure activity.     Coordination: Coordination is intact.  Psychiatric:        Behavior: Behavior normal.     ED Results / Procedures / Treatments   Labs (all labs ordered are listed, but only abnormal results are displayed) Labs Reviewed  COMPREHENSIVE METABOLIC PANEL - Abnormal; Notable for the following components:      Result Value   Sodium 134 (*)    Glucose, Bld 103 (*)    Calcium 8.8 (*)    Total Protein 8.9 (*)    All other components within normal limits  CBC WITH DIFFERENTIAL/PLATELET - Abnormal; Notable for the following components:   WBC 13.9 (*)    RBC 6.95 (*)    Hemoglobin 11.8 (*)    MCV 59.9 (*)    MCH 17.0 (*)    MCHC 28.4 (*)    RDW 19.6 (*)    Neutro Abs 9.4 (*)    All other components within normal limits    EKG None  Radiology CT Head Wo Contrast  Result Date: 12/17/2022 CLINICAL DATA:  Neuro deficit, acute, stroke suspected Headache, new onset (Age >= 51y) EXAM: CT HEAD WITHOUT CONTRAST TECHNIQUE: Contiguous axial images were obtained from the base of the skull through the vertex without intravenous contrast. RADIATION DOSE REDUCTION: This exam was performed according to the departmental dose-optimization program which includes automated exposure control, adjustment of the mA and/or kV according to patient size and/or use of iterative reconstruction technique. COMPARISON:  CT head 02/16/13 FINDINGS: Brain: No evidence  of acute infarction, hemorrhage, hydrocephalus, extra-axial collection or mass lesion/mass effect. Vascular: No hyperdense vessel or unexpected calcification. Skull: Normal. Negative for fracture or focal lesion. Sinuses/Orbits: No middle ear or mastoid effusion. Paranasal sinuses are clear. Orbits are unremarkable. Other: None. IMPRESSION: No CT etiology for headaches identified. Electronically Signed   By: Lorenza Cambridge M.D.   On: 12/17/2022 14:38    Procedures Procedures    Medications Ordered in ED  Medications  ketorolac (TORADOL) 30 MG/ML injection 30 mg (30 mg Intramuscular Given 12/17/22 1636)  metoCLOPramide (REGLAN) tablet 10 mg (10 mg Oral Given 12/17/22 1636)  diphenhydrAMINE (BENADRYL) capsule 25 mg (25 mg Oral Given 12/17/22 1636)  dexamethasone (DECADRON) tablet 6 mg (6 mg Oral Given 12/17/22 1636)    ED Course/ Medical Decision Making/ A&P                                 Medical Decision Making Amount and/or Complexity of Data Reviewed Labs: ordered. Decision-making details documented in ED Course. Radiology: ordered. Decision-making details documented in ED Course.  Risk Prescription drug management.   Medical Decision Making:   [name] is a 52 y.o. female who presented to the ED today with headache detailed above.    Patient's presentation is complicated by their history of thyroid disease, obesity.  Complete initial physical exam performed, notably the patient was neurologically intact.  Nontoxic-appearing.  No meningismus.    Reviewed and confirmed nursing documentation for past medical history, family history, social history.    Initial Assessment:   With the patient's presentation of headache, differential diagnosis includes but is not limited to emergent considerations for headache include subarachnoid hemorrhage, meningitis, temporal arteritis, glaucoma, cerebral ischemia, carotid/vertebral dissection, intracranial tumor, venous sinus thrombosis, carbon  monoxide poisoning, acute or chronic subdural hemorrhage.  Other considerations include: migraine, cluster headache, hypertension, caffeine, alcohol, or drug withdrawal, pseudotumor cerebri, arteriovenous malformation, head injury, neurocysticercosis, post-lumbar puncture, preeclampsia in those assigned female at birth and in reproductive age, tension headache, viral vs acute bacterial sinusitis, cervical arthritis, refractive error causing strain, temporomandibular joint syndrome, depression, somatoform disorder (eg, somatization), trigeminal neuralgia, glossopharyngeal neuralgia.  This is most consistent with an acute complicated illness  Initial Plan:  Screening labs including CBC and Metabolic panel to evaluate for infectious or metabolic etiology of disease.  CT brain to assess for intracranial pathology Symptomatic management Objective evaluation as below reviewed   Initial Study Results:   Laboratory  All laboratory results reviewed without evidence of clinically relevant pathology.   Exceptions include: Sodium 134, calcium 8.8, WBC 13.9, hemoglobin 11.8  Radiology:  All images reviewed independently. Agree with radiology report at this time.   CT Head Wo Contrast  Result Date: 12/17/2022 CLINICAL DATA:  Neuro deficit, acute, stroke suspected Headache, new onset (Age >= 51y) EXAM: CT HEAD WITHOUT CONTRAST TECHNIQUE: Contiguous axial images were obtained from the base of the skull through the vertex without intravenous contrast. RADIATION DOSE REDUCTION: This exam was performed according to the departmental dose-optimization program which includes automated exposure control, adjustment of the mA and/or kV according to patient size and/or use of iterative reconstruction technique. COMPARISON:  CT head 02/16/13 FINDINGS: Brain: No evidence of acute infarction, hemorrhage, hydrocephalus, extra-axial collection or mass lesion/mass effect. Vascular: No hyperdense vessel or unexpected calcification.  Skull: Normal. Negative for fracture or focal lesion. Sinuses/Orbits: No middle ear or mastoid effusion. Paranasal sinuses are clear. Orbits are unremarkable. Other: None. IMPRESSION: No CT etiology for headaches identified. Electronically Signed   By: Lorenza Cambridge M.D.   On: 12/17/2022 14:38      Final Assessment and Plan:   52 year old female presents to the ED with left-sided headache and photophobia, phonophobia.  Neurologically intact on exam.  No history of chronic headaches.  No fall or head injury.  Workup initiated from triage includes CBC, metabolic panel, and CT brain which are  unremarkable for identification of the cause of headache.  No known sick contacts.  No associated infectious symptoms.  No meningismus.  Given intact exam and overall reassuring labs and imaging, will treat headache symptomatically and have patient follow-up closely with outpatient team.  Patient agreeable with this plan.  Strict ED return precautions given, all questions answered, stable for discharge.   Clinical Impression:  1. Acute nonintractable headache, unspecified headache type      Discharge           Final Clinical Impression(s) / ED Diagnoses Final diagnoses:  Acute nonintractable headache, unspecified headache type    Rx / DC Orders ED Discharge Orders     None         Tonette Lederer, PA-C 12/17/22 1648    Lorre Nick, MD 12/17/22 2246

## 2022-12-17 NOTE — ED Notes (Signed)
Pt called for triage 2x, pt in bathroom. Completes personal hygiene w/o assist

## 2022-12-17 NOTE — ED Triage Notes (Signed)
Pt presents with c/o headache and left eye pain. Pt reports that she has had a headache for approx 8 days with no relief from home meds.

## 2022-12-21 ENCOUNTER — Ambulatory Visit (INDEPENDENT_AMBULATORY_CARE_PROVIDER_SITE_OTHER): Payer: Self-pay | Admitting: Family Medicine

## 2022-12-21 ENCOUNTER — Ambulatory Visit: Payer: Self-pay | Admitting: Family Medicine

## 2022-12-21 VITALS — BP 106/68 | HR 84 | Temp 98.9°F | Ht 61.0 in | Wt 213.0 lb

## 2022-12-21 DIAGNOSIS — K645 Perianal venous thrombosis: Secondary | ICD-10-CM

## 2022-12-21 DIAGNOSIS — R519 Headache, unspecified: Secondary | ICD-10-CM

## 2022-12-21 DIAGNOSIS — L84 Corns and callosities: Secondary | ICD-10-CM

## 2022-12-21 NOTE — Progress Notes (Signed)
Established Patient Office Visit  Subjective   Patient ID: [name], female    DOB: 17-Mar-1971  Age: 52 y.o. MRN: [id number]  Chief Complaint  Patient presents with   Headache    Pt c/o headache. Went to ER on 12/17/2022. They advise to follow up with PCP. Pt reports still having headache but not as bad as before. Locate on L side and L eye.    skin Concerns    Pt c/o bump of L thumb, sore. Going for years. Notice recently same bump on R thumb.    Rectal Pain    Pt c/o painful rectum when passing stool.     HPI   [name] is seen for the following items  She has callused areas dorsal aspect of both thumbs at the MCP joint.  She is not sure of any activities that would be causing any friction although she is in a wheelchair frequently- and likely related to propelling herself in that.  These are nonpainful  She also relates swollen area around her rectum which initially was mildly painful but now non-painful.  No recent bloody stools.  Her last colonoscopy was December 2022.  She had some internal and external hemorrhoids at that time.  No recent straining with stools. She had a relative actually take a video of area in question with swelling given her difficulties because of her multiple prior surgeries left lower extremity of getting up on the table  Recent acute headache which prompted ER visit.  This was a left-sided headache with some increased sound sensitivity.  Was initially about 9 out of 10.  CT scan revealed no acute abnormalities.  Labs are unremarkable.  Headache is greatly improved at this time.  Still has occasional low-grade dull headache.  Took some Tylenol with some relief.  Questionable history of migraines in the past.  No acute visual changes.  She does state she has had some increased pressure issues in her eyes in the past and she promptly set up eye exam which is pending for next week.  No diplopia.  No acute vision loss. She did states that the severe headache  that prompted ER visit was somewhat of a throbbing quality.  No vomiting.  Past Medical History:  Diagnosis Date   Chest pain    GERD (gastroesophageal reflux disease)    Headache    Thyroid disease    Past Surgical History:  Procedure Laterality Date   BARIATRIC SURGERY     CHOLECYSTECTOMY     FOOT SURGERY Left 08/14/2020   galbladder     UMBILICAL HERNIA REPAIR      reports that she has never smoked. She has never used smokeless tobacco. She reports that she does not drink alcohol and does not use drugs. family history includes Diabetes in her father and sister; Heart attack in her mother; Heart disease in her father; Hypertension in her father. Allergies  Allergen Reactions   Augmentin [Amoxicillin-Pot Clavulanate] Shortness Of Breath   Pork-Derived Products Other (See Comments)    Religous     Review of Systems  Constitutional:  Negative for chills, fever and weight loss.  Eyes:  Negative for blurred vision and double vision.  Respiratory:  Negative for cough.   Cardiovascular:  Negative for chest pain.  Gastrointestinal:  Negative for abdominal pain, blood in stool, constipation, diarrhea and melena.  Neurological:  Positive for headaches.       See HPI      Objective:  BP 106/68 (BP Location: Left Arm, Patient Position: Sitting, Cuff Size: Large)   Pulse 84   Temp 98.9 F (37.2 C) (Oral)   Ht 5\' 1"  (1.549 m)   Wt 213 lb (96.6 kg) Comment: pt reports  SpO2 96%   BMI 40.25 kg/m  BP Readings from Last 3 Encounters:  12/21/22 106/68  12/17/22 110/70  11/20/22 100/64      Physical Exam Vitals reviewed.  Constitutional:      General: She is not in acute distress.    Appearance: She is well-developed. She is not ill-appearing.  HENT:     Head: Normocephalic and atraumatic.  Eyes:     Extraocular Movements: Extraocular movements intact.     Pupils: Pupils are equal, round, and reactive to light.  Cardiovascular:     Rate and Rhythm: Normal rate and  regular rhythm.  Pulmonary:     Effort: Pulmonary effort is normal.     Breath sounds: Normal breath sounds.  Skin:    Comments: She has what looks like a couple small circumferential calluses symmetrically distributed dorsal aspect of the right and left thumb over the MCP joints.  No atypical features.  Neurological:     Mental Status: She is alert.     Cranial Nerves: No cranial nerve deficit or facial asymmetry.     Motor: No weakness.      No results found for any visits on 12/21/22.    The 10-year ASCVD risk score (Arnett DK, et al., 2019) is: 2.2%    Assessment & Plan:   #1 benign appearing calluses both thumbs.  Try to avoid activities that cause friction over this area.  Reassurance  #2 benign-appearing external hemorrhoid.  No pain at this time.  Her last colonoscopy was 12/22.  Discussed measures to reduce constipation.  Consider over-the-counter Anusol HC cream.  Follow-up for any pain, increased size, or other concerns  #3 recent headache.  This was somewhat unilateral and sounded almost more typical of migraine headache.  CT scan unremarkable.  Headache only low-grade and dull at this time.  She has eye exam already set up.  She has concerns because of her past history of increased eye pressures.  If headache recurs with increased intensity be in touch and may need to look at options for relieving acute migraine  Evelena Peat, MD

## 2023-01-07 ENCOUNTER — Encounter: Payer: Self-pay | Admitting: Cardiovascular Disease

## 2023-01-07 NOTE — Progress Notes (Signed)
Appt cancelled This encounter was created in error - please disregard.

## 2023-01-08 ENCOUNTER — Ambulatory Visit: Payer: Self-pay | Admitting: Cardiovascular Disease

## 2023-03-23 ENCOUNTER — Ambulatory Visit: Payer: Self-pay | Admitting: Family Medicine

## 2023-05-03 ENCOUNTER — Ambulatory Visit: Payer: Self-pay | Admitting: Family Medicine

## 2023-05-14 ENCOUNTER — Ambulatory Visit: Payer: Self-pay | Admitting: Family Medicine

## 2023-05-19 ENCOUNTER — Ambulatory Visit: Payer: Self-pay | Admitting: Family Medicine

## 2023-06-02 ENCOUNTER — Ambulatory Visit: Payer: Self-pay | Admitting: Family Medicine

## 2023-06-08 ENCOUNTER — Ambulatory Visit: Payer: Self-pay | Admitting: Family Medicine

## 2023-06-09 ENCOUNTER — Ambulatory Visit: Payer: Self-pay | Admitting: Family Medicine

## 2023-08-13 ENCOUNTER — Ambulatory Visit (INDEPENDENT_AMBULATORY_CARE_PROVIDER_SITE_OTHER): Payer: Self-pay | Admitting: Family Medicine

## 2023-08-13 VITALS — BP 110/66 | HR 70 | Temp 98.1°F

## 2023-08-13 DIAGNOSIS — E1165 Type 2 diabetes mellitus with hyperglycemia: Secondary | ICD-10-CM

## 2023-08-13 DIAGNOSIS — E038 Other specified hypothyroidism: Secondary | ICD-10-CM

## 2023-08-13 DIAGNOSIS — D1779 Benign lipomatous neoplasm of other sites: Secondary | ICD-10-CM

## 2023-08-13 DIAGNOSIS — Z7984 Long term (current) use of oral hypoglycemic drugs: Secondary | ICD-10-CM

## 2023-08-13 MED ORDER — ONDANSETRON 8 MG PO TBDP: ORAL_TABLET | ORAL | 1 refills | Status: AC

## 2023-08-13 NOTE — Progress Notes (Signed)
 Established Patient Office Visit  Subjective   Patient ID: [name], female    DOB: [birth year]  Age: 53 y.o. MRN: [id number]  Chief Complaint  Patient presents with   Hernia    HPI   [name] is seen for medical follow-up.  He had prior history of gastric bypass surgery and also umbilical hernia repair 2005.  She is seen today with question of possible hernia left side of abdomen just inferior and lateral to umbilicus region.  She actually states she saw a surgeon back in 2023 who felt this was not a hernia.  No associated pain.  Not increasing in size.  She has hypothyroidism and is on levothyroxine 150 mcg daily.  Has not had TSH in over a year.  She has had well-controlled diabetes on metformin.  Last A1c 6.9%.  Multiple left ankle/foot surgeries at [facility] over the past several years and hopes to be getting walking boot off soon.  Past Medical History:  Diagnosis Date   Chest pain    GERD (gastroesophageal reflux disease)    Headache    Thyroid disease    Past Surgical History:  Procedure Laterality Date   BARIATRIC SURGERY     CHOLECYSTECTOMY     FOOT SURGERY Left 08/14/2020   galbladder     UMBILICAL HERNIA REPAIR      reports that she has never smoked. She has never used smokeless tobacco. She reports that she does not drink alcohol and does not use drugs. family history includes Diabetes in her father and sister; Heart attack in her mother; Heart disease in her father; Hypertension in her father. Allergies  Allergen Reactions   Augmentin [Amoxicillin-Pot Clavulanate] Shortness Of Breath   Pork-Derived Products Other (See Comments)    Religous     Review of Systems  Constitutional:  Negative for chills, fever and malaise/fatigue.  Eyes:  Negative for blurred vision.  Respiratory:  Negative for shortness of breath.   Cardiovascular:  Negative for chest pain.  Gastrointestinal:  Negative for abdominal pain.  Neurological:  Negative for dizziness, weakness  and headaches.      Objective:     BP 110/66 (BP Location: Left Arm, Patient Position: Sitting, Cuff Size: Large)   Pulse 70   Temp 98.1 F (36.7 C) (Oral)   SpO2 96%    Physical Exam Vitals reviewed.  Constitutional:      Appearance: She is well-developed.  Eyes:     Pupils: Pupils are equal, round, and reactive to light.  Neck:     Thyroid: No thyromegaly.     Vascular: No JVD.  Cardiovascular:     Rate and Rhythm: Normal rate and regular rhythm.     Heart sounds:     No gallop.  Pulmonary:     Effort: Pulmonary effort is normal. No respiratory distress.     Breath sounds: Normal breath sounds. No wheezing or rales.  Abdominal:     Comments: She has significant scarring of the abdomen from prior abdominal surgeries.  Area in question is palpated and feels like fatty tissue.  Not reducible.  No hernia defects palpated.  Nontender to palpation.  Musculoskeletal:     Cervical back: Neck supple.  Neurological:     Mental Status: She is alert.      No results found for any visits on 08/13/23.    The 10-year ASCVD risk score (Arnett DK, et al., 2019) is: 2.6%    Assessment & Plan:   #1 hypothyroidism.  Patient on replacement.  Needs follow-up TSH is will be drawn today.  Will send in refills of her levels are normal  #2 type 2 diabetes with history of good control.  Recheck A1c today.  We overlook getting urine microalbumin screen and this will need to be checked with next labs.  Continue yearly diabetic eye exam.  #3 abdominal mass.  This appears to be some benign fatty tissue.  It was checked by surgeon couple years ago and determined not to be hernia.  No evidence for hernia on exam.  No worrisome features such as pain or growth.  Reassurance given   No follow-ups on file.    Evelena Peat, MD

## 2023-08-14 LAB — HEMOGLOBIN A1C
Hgb A1c MFr Bld: 7 %{Hb} — ABNORMAL HIGH (ref <5.7)
Mean Plasma Glucose: 154 mg/dL
eAG (mmol/L): 8.5 mmol/L

## 2023-08-14 LAB — MICROALBUMIN / CREATININE URINE RATIO
Creatinine, Urine: 16 mg/dL — ABNORMAL LOW (ref 20–275)
Microalb, Ur: <0.2 mg/dL

## 2023-08-14 LAB — TSH: TSH: 4.11 m[IU]/L

## 2023-08-17 ENCOUNTER — Other Ambulatory Visit: Payer: Self-pay | Admitting: Family Medicine

## 2023-08-17 NOTE — Telephone Encounter (Signed)
 Last Fill: Metformin: 05/22/22     Levothyroxine: 03/31/22  Last OV: 08/13/23 Next OV: None Scheduled  Routing to provider for review/authorization.

## 2023-08-17 NOTE — Telephone Encounter (Signed)
 Copied from CRM [id number]. Topic: Clinical - Medication Refill >> Aug 17, 2023 12:42 PM [name] wrote: Most Recent Primary Care Visit:  Provider: Kristian Covey  Department: [facility]  Visit Type: ACUTE  Date: 08/13/2023  Medication: metFORMIN (GLUCOPHAGE) 500 MG tablet levothyroxine (SYNTHROID) 150 MCG tablet  Has the patient contacted their pharmacy? No (Agent: If no, request that the patient contact the pharmacy for the refill. If patient does not wish to contact the pharmacy document the reason why and proceed with request.) (Agent: If yes, when and what did the pharmacy advise?)  Is this the correct pharmacy for this prescription? Yes If no, delete pharmacy and type the correct one.  This is the patient's preferred pharmacy:  [facility] [address] [place] - [address] [place] [address] Phone: [phone] Fax: [phone]   Has the prescription been filled recently? No  Is the patient out of the medication? Yes  Has the patient been seen for an appointment in the last year OR does the patient have an upcoming appointment? Yes  Can we respond through MyChart? Yes  Agent: Please be advised that Rx refills may take up to 3 business days. We ask that you follow-up with your pharmacy.

## 2023-08-18 MED ORDER — LEVOTHYROXINE SODIUM 150 MCG PO TABS: 150.0000 ug | ORAL_TABLET | Freq: Every day | ORAL | 1 refills | Status: AC

## 2023-08-18 MED ORDER — METFORMIN HCL 500 MG PO TABS: 500.0000 mg | ORAL_TABLET | Freq: Two times a day (BID) | ORAL | 1 refills | Status: AC

## 2023-11-17 ENCOUNTER — Ambulatory Visit: Payer: Self-pay | Admitting: Family Medicine

## 2023-11-23 ENCOUNTER — Ambulatory Visit (INDEPENDENT_AMBULATORY_CARE_PROVIDER_SITE_OTHER): Payer: Self-pay | Admitting: Family Medicine

## 2023-11-23 ENCOUNTER — Encounter: Payer: Self-pay | Admitting: Family Medicine

## 2023-11-23 VITALS — BP 110/80 | HR 81 | Temp 98.3°F | Wt 225.3 lb

## 2023-11-23 DIAGNOSIS — H61892 Other specified disorders of left external ear: Secondary | ICD-10-CM

## 2023-11-23 MED ORDER — OFLOXACIN 0.3 % OT SOLN: 5.0000 [drp] | Freq: Two times a day (BID) | OTIC | 0 refills | Status: AC

## 2023-11-23 NOTE — Patient Instructions (Signed)
 Try to keep water out of ear as much as possible  Use the prescribed antibiotic drops twice daily  Let me know if not better in two weeks.

## 2023-11-23 NOTE — Progress Notes (Signed)
 Established Patient Office Visit  Subjective   Patient ID: [name], female    DOB: [birth year]  Age: 53 y.o. MRN: [id number]  Chief Complaint  Patient presents with   Ear Pain    HPI   [name] is seen with left ear fullness and some intermittent pain for past couple weeks.  No recent swimming.  No drainage.  She went in with a Q-tip couple times and wonders if she may have irritated the canal.  Also recalls sticking her finger in and scratching her canal and had a bit of bleeding afterwards and this was about a week ago.  She seemed to be getting some better end of last week but then started having symptoms again over the weekend.  Denies any vertigo.  No tinnitus.  No hearing loss.  No recent change of altitude but she is considering going back to [place] in a few weeks  Past Medical History:  Diagnosis Date   Chest pain    GERD (gastroesophageal reflux disease)    Headache    Thyroid disease    Past Surgical History:  Procedure Laterality Date   BARIATRIC SURGERY     CHOLECYSTECTOMY     FOOT SURGERY Left 08/14/2020   galbladder     UMBILICAL HERNIA REPAIR      reports that she has never smoked. She has never used smokeless tobacco. She reports that she does not drink alcohol and does not use drugs. family history includes Diabetes in her father and sister; Heart attack in her mother; Heart disease in her father; Hypertension in her father. Allergies  Allergen Reactions   Augmentin [Amoxicillin-Pot Clavulanate] Shortness Of Breath   Pork-Derived Products Other (See Comments)    Religous     Review of Systems  Constitutional:  Negative for chills and fever.  HENT:  Positive for ear pain. Negative for congestion, hearing loss and tinnitus.       Objective:     BP 110/80 (BP Location: Left Arm, Patient Position: Sitting, Cuff Size: Large)   Pulse 81   Temp 98.3 F (36.8 C) (Oral)   Wt 225 lb 4.8 oz (102.2 kg)   SpO2 97%   BMI 42.57 kg/m  BP Readings  from Last 3 Encounters:  11/23/23 110/80  08/13/23 110/66  12/21/22 106/68   Wt Readings from Last 3 Encounters:  11/23/23 225 lb 4.8 oz (102.2 kg)  12/21/22 213 lb (96.6 kg)  07/01/22 253 lb 4.8 oz (114.9 kg)      Physical Exam Vitals reviewed.  Constitutional:      General: She is not in acute distress.    Appearance: She is not ill-appearing.  HENT:     Ears:     Comments: Right ear canal is normal.  She does have some cerumen in the canal which obscures visualization of eardrum.  Left canal reveals very mild swelling.  No significant cerumen.  Eardrum is visualized and is gray in color with normal landmarks.  No visible effusion. Neurological:     Mental Status: She is alert.      No results found for any visits on 11/23/23.    The ASCVD Risk score (Arnett DK, et al., 2019) failed to calculate for the following reasons:   Cannot find a previous HDL lab   Cannot find a previous total cholesterol lab    Assessment & Plan:   Mild left ear canal irritation following superficial abrasion with fingernail.  No visible drainage.  Eardrum  appears normal.  We recommend keeping relatively dry.  Recommend Floxin  otic drops 5 drops left ear canal twice daily.  Touch base if symptoms persist  Rachael Scarlet, MD
